# Patient Record
Sex: Female | Born: 1935 | Race: White | Hispanic: No | Smoking: Never smoker
Health system: Southern US, Community
[De-identification: ages and names within clinical notes are randomized; demographics above are authoritative.]

## PROBLEM LIST (undated history)

## (undated) DIAGNOSIS — E039 Hypothyroidism, unspecified: Secondary | ICD-10-CM

## (undated) DIAGNOSIS — B2790 Infectious mononucleosis, unspecified without complication: Secondary | ICD-10-CM

## (undated) DIAGNOSIS — G43909 Migraine, unspecified, not intractable, without status migrainosus: Secondary | ICD-10-CM

## (undated) DIAGNOSIS — M1990 Unspecified osteoarthritis, unspecified site: Secondary | ICD-10-CM

## (undated) DIAGNOSIS — I509 Heart failure, unspecified: Secondary | ICD-10-CM

## (undated) DIAGNOSIS — E7800 Pure hypercholesterolemia, unspecified: Secondary | ICD-10-CM

## (undated) HISTORY — PX: ABDOMINAL HYSTERECTOMY: SHX81

## (undated) HISTORY — PX: HERNIA REPAIR: SHX51

## (undated) HISTORY — PX: BREAST SURGERY: SHX581

## (undated) HISTORY — PX: BLADDER SUSPENSION: SHX72

## (undated) HISTORY — PX: UMBILICAL HERNIA REPAIR: SHX196

## (undated) HISTORY — PX: BREAST LUMPECTOMY: SHX2

## (undated) HISTORY — PX: FRACTURE SURGERY: SHX138

## (undated) HISTORY — PX: ANKLE FRACTURE SURGERY: SHX122

---

## 2011-09-19 ENCOUNTER — Emergency Department (HOSPITAL_COMMUNITY): Payer: Medicare Other

## 2011-09-19 ENCOUNTER — Encounter (HOSPITAL_COMMUNITY): Payer: Self-pay | Admitting: Emergency Medicine

## 2011-09-19 ENCOUNTER — Emergency Department (HOSPITAL_COMMUNITY)
Admission: EM | Admit: 2011-09-19 | Discharge: 2011-09-19 | Disposition: A | Discharge status: Home / Self Care | Payer: Medicare Other | Attending: Emergency Medicine | Admitting: Emergency Medicine

## 2011-09-19 ENCOUNTER — Other Ambulatory Visit: Payer: Self-pay

## 2011-09-19 DIAGNOSIS — M25519 Pain in unspecified shoulder: Secondary | ICD-10-CM | POA: Insufficient documentation

## 2011-09-19 DIAGNOSIS — M47812 Spondylosis without myelopathy or radiculopathy, cervical region: Secondary | ICD-10-CM | POA: Insufficient documentation

## 2011-09-19 DIAGNOSIS — E7800 Pure hypercholesterolemia, unspecified: Secondary | ICD-10-CM | POA: Insufficient documentation

## 2011-09-19 DIAGNOSIS — J984 Other disorders of lung: Secondary | ICD-10-CM | POA: Insufficient documentation

## 2011-09-19 DIAGNOSIS — R079 Chest pain, unspecified: Secondary | ICD-10-CM | POA: Insufficient documentation

## 2011-09-19 DIAGNOSIS — R109 Unspecified abdominal pain: Secondary | ICD-10-CM | POA: Insufficient documentation

## 2011-09-19 DIAGNOSIS — E039 Hypothyroidism, unspecified: Secondary | ICD-10-CM | POA: Insufficient documentation

## 2011-09-19 DIAGNOSIS — K8020 Calculus of gallbladder without cholecystitis without obstruction: Secondary | ICD-10-CM | POA: Insufficient documentation

## 2011-09-19 DIAGNOSIS — M25569 Pain in unspecified knee: Secondary | ICD-10-CM | POA: Insufficient documentation

## 2011-09-19 DIAGNOSIS — R000 Tachycardia, unspecified: Secondary | ICD-10-CM | POA: Insufficient documentation

## 2011-09-19 DIAGNOSIS — R42 Dizziness and giddiness: Secondary | ICD-10-CM | POA: Insufficient documentation

## 2011-09-19 DIAGNOSIS — R0602 Shortness of breath: Secondary | ICD-10-CM | POA: Insufficient documentation

## 2011-09-19 DIAGNOSIS — I10 Essential (primary) hypertension: Secondary | ICD-10-CM | POA: Insufficient documentation

## 2011-09-19 DIAGNOSIS — K5730 Diverticulosis of large intestine without perforation or abscess without bleeding: Secondary | ICD-10-CM | POA: Insufficient documentation

## 2011-09-19 DIAGNOSIS — I959 Hypotension, unspecified: Secondary | ICD-10-CM | POA: Insufficient documentation

## 2011-09-19 HISTORY — DX: Hypothyroidism, unspecified: E03.9

## 2011-09-19 HISTORY — DX: Pure hypercholesterolemia, unspecified: E78.00

## 2011-09-19 LAB — COMPREHENSIVE METABOLIC PANEL
Albumin: 4 g/dL (ref 3.5–5.2)
Alkaline Phosphatase: 67 U/L (ref 39–117)
BUN: 21 mg/dL (ref 6–23)
Calcium: 9.8 mg/dL (ref 8.4–10.5)
Potassium: 4.4 mEq/L (ref 3.5–5.1)
Total Protein: 6.9 g/dL (ref 6.0–8.3)

## 2011-09-19 LAB — CBC
HCT: 41.8 % (ref 36.0–46.0)
MCH: 30.3 pg (ref 26.0–34.0)
MCHC: 33.5 g/dL (ref 30.0–36.0)
RDW: 14.1 % (ref 11.5–15.5)

## 2011-09-19 LAB — LIPASE, BLOOD: Lipase: 63 U/L — ABNORMAL HIGH (ref 11–59)

## 2011-09-19 MED ORDER — IOHEXOL 300 MG/ML  SOLN
100.0000 mL | Freq: Once | INTRAMUSCULAR | Status: AC | PRN
  Administered 2011-09-19: 100 mL via INTRAVENOUS

## 2011-09-19 NOTE — ED Notes (Addendum)
Pt transported to CT. 20:16 pt returned from CT. Pt placed back on monitor.

## 2011-09-19 NOTE — ED Notes (Signed)
Pt aware of discharge. Pt not complaining of pain at this time except for breathing deep but pain has decreased to a 1/10 with deep breath. Pt states that her right ankle has been hurting her at times she she moves her toes and rotates her foot. Pt remains alert and oriented.
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
## 2011-09-19 NOTE — ED Notes (Signed)
EKG completed and given to Dr. Anitra Lauth.  No OLD ekg.
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
## 2011-09-19 NOTE — ED Notes (Addendum)
Pt to xray
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
## 2013-09-21 DIAGNOSIS — M25569 Pain in unspecified knee: Secondary | ICD-10-CM | POA: Diagnosis not present

## 2013-11-29 DIAGNOSIS — E785 Hyperlipidemia, unspecified: Secondary | ICD-10-CM | POA: Diagnosis not present

## 2013-11-29 DIAGNOSIS — R0602 Shortness of breath: Secondary | ICD-10-CM | POA: Diagnosis not present

## 2013-11-29 DIAGNOSIS — N183 Chronic kidney disease, stage 3 unspecified: Secondary | ICD-10-CM | POA: Diagnosis not present

## 2013-11-29 DIAGNOSIS — I10 Essential (primary) hypertension: Secondary | ICD-10-CM | POA: Diagnosis not present

## 2013-12-02 DIAGNOSIS — R5381 Other malaise: Secondary | ICD-10-CM | POA: Diagnosis not present

## 2013-12-02 DIAGNOSIS — E785 Hyperlipidemia, unspecified: Secondary | ICD-10-CM | POA: Diagnosis not present

## 2013-12-02 DIAGNOSIS — N183 Chronic kidney disease, stage 3 unspecified: Secondary | ICD-10-CM | POA: Diagnosis not present

## 2013-12-02 DIAGNOSIS — I10 Essential (primary) hypertension: Secondary | ICD-10-CM | POA: Diagnosis not present

## 2014-03-29 DIAGNOSIS — N183 Chronic kidney disease, stage 3 unspecified: Secondary | ICD-10-CM | POA: Diagnosis not present

## 2014-03-29 DIAGNOSIS — E039 Hypothyroidism, unspecified: Secondary | ICD-10-CM | POA: Diagnosis not present

## 2014-03-29 DIAGNOSIS — R5383 Other fatigue: Secondary | ICD-10-CM | POA: Diagnosis not present

## 2014-03-29 DIAGNOSIS — R5381 Other malaise: Secondary | ICD-10-CM | POA: Diagnosis not present

## 2014-04-07 DIAGNOSIS — I10 Essential (primary) hypertension: Secondary | ICD-10-CM | POA: Diagnosis not present

## 2014-04-07 DIAGNOSIS — D485 Neoplasm of uncertain behavior of skin: Secondary | ICD-10-CM | POA: Diagnosis not present

## 2014-04-07 DIAGNOSIS — E039 Hypothyroidism, unspecified: Secondary | ICD-10-CM | POA: Diagnosis not present

## 2014-04-07 DIAGNOSIS — R29818 Other symptoms and signs involving the nervous system: Secondary | ICD-10-CM | POA: Diagnosis not present

## 2014-04-07 DIAGNOSIS — N183 Chronic kidney disease, stage 3 unspecified: Secondary | ICD-10-CM | POA: Diagnosis not present

## 2014-04-07 DIAGNOSIS — M948X9 Other specified disorders of cartilage, unspecified sites: Secondary | ICD-10-CM | POA: Diagnosis not present

## 2014-04-07 DIAGNOSIS — R490 Dysphonia: Secondary | ICD-10-CM | POA: Diagnosis not present

## 2014-06-01 DIAGNOSIS — E039 Hypothyroidism, unspecified: Secondary | ICD-10-CM | POA: Diagnosis not present

## 2014-06-01 DIAGNOSIS — M19011 Primary osteoarthritis, right shoulder: Secondary | ICD-10-CM | POA: Diagnosis not present

## 2014-06-01 DIAGNOSIS — M19012 Primary osteoarthritis, left shoulder: Secondary | ICD-10-CM | POA: Diagnosis not present

## 2014-06-01 DIAGNOSIS — D485 Neoplasm of uncertain behavior of skin: Secondary | ICD-10-CM | POA: Diagnosis not present

## 2014-06-01 DIAGNOSIS — I10 Essential (primary) hypertension: Secondary | ICD-10-CM | POA: Diagnosis not present

## 2014-06-01 DIAGNOSIS — M898X8 Other specified disorders of bone, other site: Secondary | ICD-10-CM | POA: Diagnosis not present

## 2014-06-08 DIAGNOSIS — M898X8 Other specified disorders of bone, other site: Secondary | ICD-10-CM | POA: Diagnosis not present

## 2014-06-08 DIAGNOSIS — G4700 Insomnia, unspecified: Secondary | ICD-10-CM | POA: Diagnosis not present

## 2014-06-08 DIAGNOSIS — E039 Hypothyroidism, unspecified: Secondary | ICD-10-CM | POA: Diagnosis not present

## 2014-06-08 DIAGNOSIS — M25562 Pain in left knee: Secondary | ICD-10-CM | POA: Diagnosis not present

## 2014-06-08 DIAGNOSIS — N183 Chronic kidney disease, stage 3 (moderate): Secondary | ICD-10-CM | POA: Diagnosis not present

## 2014-06-08 DIAGNOSIS — Z23 Encounter for immunization: Secondary | ICD-10-CM | POA: Diagnosis not present

## 2014-06-08 DIAGNOSIS — I10 Essential (primary) hypertension: Secondary | ICD-10-CM | POA: Diagnosis not present

## 2014-06-08 DIAGNOSIS — R490 Dysphonia: Secondary | ICD-10-CM | POA: Diagnosis not present

## 2014-06-15 DIAGNOSIS — R5383 Other fatigue: Secondary | ICD-10-CM | POA: Diagnosis not present

## 2014-06-15 DIAGNOSIS — R490 Dysphonia: Secondary | ICD-10-CM | POA: Diagnosis not present

## 2014-06-23 DIAGNOSIS — Z961 Presence of intraocular lens: Secondary | ICD-10-CM | POA: Diagnosis not present

## 2014-07-31 ENCOUNTER — Emergency Department (HOSPITAL_COMMUNITY)
Admission: EM | Admit: 2014-07-31 | Discharge: 2014-07-31 | Disposition: A | Discharge status: Home / Self Care | Payer: Medicare Other | Attending: Emergency Medicine | Admitting: Emergency Medicine

## 2014-07-31 ENCOUNTER — Other Ambulatory Visit: Payer: Self-pay

## 2014-07-31 ENCOUNTER — Encounter (HOSPITAL_COMMUNITY): Payer: Self-pay | Admitting: Emergency Medicine

## 2014-07-31 ENCOUNTER — Emergency Department (HOSPITAL_COMMUNITY): Payer: Medicare Other

## 2014-07-31 DIAGNOSIS — I10 Essential (primary) hypertension: Secondary | ICD-10-CM | POA: Diagnosis not present

## 2014-07-31 DIAGNOSIS — R42 Dizziness and giddiness: Secondary | ICD-10-CM | POA: Insufficient documentation

## 2014-07-31 DIAGNOSIS — Z79899 Other long term (current) drug therapy: Secondary | ICD-10-CM | POA: Diagnosis not present

## 2014-07-31 DIAGNOSIS — E039 Hypothyroidism, unspecified: Secondary | ICD-10-CM | POA: Diagnosis not present

## 2014-07-31 DIAGNOSIS — Z7982 Long term (current) use of aspirin: Secondary | ICD-10-CM | POA: Insufficient documentation

## 2014-07-31 LAB — BASIC METABOLIC PANEL
ANION GAP: 12 (ref 5–15)
BUN: 19 mg/dL (ref 6–23)
CALCIUM: 9.2 mg/dL (ref 8.4–10.5)
CO2: 24 meq/L (ref 19–32)
CREATININE: 0.85 mg/dL (ref 0.50–1.10)
Chloride: 108 mEq/L (ref 96–112)
GFR, EST AFRICAN AMERICAN: 69 mL/min — AB (ref >90)
GFR, EST NON AFRICAN AMERICAN: 59 mL/min — AB (ref >90)
Glucose, Bld: 89 mg/dL (ref 70–99)
Potassium: 4.4 mEq/L (ref 3.7–5.3)
SODIUM: 144 meq/L (ref 137–147)

## 2014-07-31 LAB — CBC
HEMATOCRIT: 38.3 % (ref 36.0–46.0)
HEMOGLOBIN: 12.3 g/dL (ref 12.0–15.0)
MCH: 29 pg (ref 26.0–34.0)
MCHC: 32.1 g/dL (ref 30.0–36.0)
MCV: 90.3 fL (ref 78.0–100.0)
Platelets: 193 10*3/uL (ref 150–400)
RBC: 4.24 MIL/uL (ref 3.87–5.11)
RDW: 13.8 % (ref 11.5–15.5)
WBC: 4.7 10*3/uL (ref 4.0–10.5)

## 2014-07-31 LAB — CBG MONITORING, ED: Glucose-Capillary: 101 mg/dL — ABNORMAL HIGH (ref 70–99)

## 2014-07-31 LAB — URINALYSIS, ROUTINE W REFLEX MICROSCOPIC
Bilirubin Urine: NEGATIVE
GLUCOSE, UA: NEGATIVE mg/dL
HGB URINE DIPSTICK: NEGATIVE
Ketones, ur: NEGATIVE mg/dL
Nitrite: NEGATIVE
PROTEIN: NEGATIVE mg/dL
SPECIFIC GRAVITY, URINE: 1.01 (ref 1.005–1.030)
Urobilinogen, UA: 0.2 mg/dL (ref 0.0–1.0)
pH: 7.5 (ref 5.0–8.0)

## 2014-07-31 LAB — URINE MICROSCOPIC-ADD ON

## 2014-07-31 LAB — TROPONIN I

## 2014-07-31 MED ORDER — LORAZEPAM 0.5 MG PO TABS: 0.5000 mg | ORAL_TABLET | Freq: Three times a day (TID) | ORAL | Status: DC | PRN

## 2014-07-31 MED ORDER — LORAZEPAM 1 MG PO TABS
0.5000 mg | ORAL_TABLET | Freq: Once | ORAL | Status: AC
  Administered 2014-07-31: 0.5 mg via ORAL
  Filled 2014-07-31: qty 1

## 2014-07-31 NOTE — ED Notes (Signed)
Pt ambulated with standby assist. Pt had an episode of dizziness with sitting with lasted only a few second. Pt states that she feels "much better"

## 2014-07-31 NOTE — ED Provider Notes (Signed)
CSN: [id number]     Arrival date & time 07/31/14  7591 History   First MD Initiated Contact with Patient 07/31/14 [phone]     Chief Complaint  Patient presents with  . Dizziness     (Consider location/radiation/quality/duration/timing/severity/associated sxs/prior Treatment) HPI   Madison Marquez is a 80 y.o. female who reports dizziness that started during the night, when she got up to go to the bathroom.  She was able to get there, then went back to bed.  Later she got up at her usual time this morning and noticed again that she was dizzy, with movements of head and walking.  The dizziness caused her to feel nauseated.  She came by EMS because she did not feel that she could walk.  She has never had this previously.  She denies fever, chills, rhinorrhea, cough, shortness of breath, chest pain, paresthesia or focal weakness.  She has not had any recent illnesses.  She is taking her usual medications.  There are no other known modifying factors.    Past Medical History  Diagnosis Date  . Hypothyroid   . Hypertension   . Hypercholesterolemia    Past Surgical History  Procedure Laterality Date  . Hernia repair     No family history on file. History  Substance Use Topics  . Smoking status: Never Smoker   . Smokeless tobacco: Not on file  . Alcohol Use: No   OB History    No data available     Review of Systems  All other systems reviewed and are negative.     Allergies  Review of patient's allergies indicates no known allergies.  Home Medications   Prior to Admission medications   Medication Sig Start Date End Date Taking? Authorizing Provider  aspirin 325 MG tablet Take 325 mg by mouth daily.   Yes Historical Provider, MD  B Complex-C (B-COMPLEX WITH VITAMIN C) tablet Take 1 tablet by mouth daily.   Yes Historical Provider, MD  cholecalciferol (VITAMIN D) 1000 UNITS tablet Take 1,000 Units by mouth daily.   Yes Historical Provider, MD  levothyroxine (SYNTHROID,  LEVOTHROID) 75 MCG tablet Take 75 mcg by mouth daily.   Yes Historical Provider, MD  vitamin B-12 (CYANOCOBALAMIN) 1000 MCG tablet Take 1,000 mcg by mouth daily.   Yes Historical Provider, MD  lisinopril (PRINIVIL,ZESTRIL) 10 MG tablet Take 10 mg by mouth at bedtime.    Historical Provider, MD  LORazepam (ATIVAN) 0.5 MG tablet Take 1 tablet (0.5 mg total) by mouth every 8 (eight) hours as needed (dizziness). 07/31/14   Richarda Blade, MD  simvastatin (ZOCOR) 40 MG tablet Take 40 mg by mouth at bedtime.    Historical Provider, MD   BP 128/72 mmHg  Pulse 84  Temp(Src) 98 F (36.7 C) (Oral)  Resp 16  Ht 5\' 5"  (1.651 m)  Wt 160 lb (72.576 kg)  BMI 26.63 kg/m2  SpO2 100% Physical Exam  Constitutional: She is oriented to person, place, and time. She appears well-developed.  Elderly, frail  HENT:  Head: Normocephalic and atraumatic.  Right Ear: External ear normal.  Left Ear: External ear normal.  Eyes: Conjunctivae and EOM are normal. Pupils are equal, round, and reactive to light.  Neck: Normal range of motion and phonation normal. Neck supple.  Cardiovascular: Normal rate, regular rhythm and normal heart sounds.   Pulmonary/Chest: Effort normal and breath sounds normal. She exhibits no bony tenderness.  Abdominal: Soft. There is no tenderness.  Musculoskeletal: Normal range of  motion.  Neurological: She is alert and oriented to person, place, and time. No cranial nerve deficit or sensory deficit. She exhibits normal muscle tone. Coordination normal.  No dysarthria or aphasia.  Mild bilateral direction changing nystagmus, which extinguishes in a few beats.  Negative test of skew, and head impulse testing  Skin: Skin is warm, dry and intact.  Psychiatric: She has a normal mood and affect. Her behavior is normal. Judgment and thought content normal.  Nursing note and vitals reviewed.   ED Course  Procedures (including critical care time)  Medications  LORazepam (ATIVAN) tablet 0.5 mg  (0.5 mg Oral Given 07/31/14 1147)    No data found.   11:31 AM Reevaluation with update and discussion. After initial assessment and treatment, an updated evaluation reveals she continues to be comfortable at rest, without dizziness.  On an attempt at ambulation, she had significant dizziness that required support of nursing staff to prevent her from falling.  She will be treated with Ativan, and attempt again to ambulate.  Family is updated on the current findings, and anticipated plan. [name]   13:00- she is now able to ambulate easily, without assistance.  She is somewhat sleepy from the Ativan.  Findings discussed with patient and family member, all questions answered.   Labs Review Labs Reviewed  BASIC METABOLIC PANEL - Abnormal; Notable for the following:    GFR calc non Af Amer 59 (*)    GFR calc Af Amer 69 (*)    All other components within normal limits  URINALYSIS, ROUTINE W REFLEX MICROSCOPIC - Abnormal; Notable for the following:    Leukocytes, UA SMALL (*)    All other components within normal limits  URINE MICROSCOPIC-ADD ON - Abnormal; Notable for the following:    Squamous Epithelial / LPF FEW (*)    All other components within normal limits  CBG MONITORING, ED - Abnormal; Notable for the following:    Glucose-Capillary 101 (*)    All other components within normal limits  URINE CULTURE  CBC  TROPONIN I    Imaging Review No results found.   EKG Interpretation   Date/Time:  Sunday July 31 2014 08:26:57 EST Ventricular Rate:  58 PR Interval:  202 QRS Duration: 95 QT Interval:  414 QTC Calculation: 407 R Axis:   -37 Text Interpretation:  Sinus rhythm Ventricular premature complex Left axis  deviation Low voltage, extremity leads ED PHYSICIAN INTERPRETATION  AVAILABLE IN [facility] Confirmed by TEST, Record ([id number]) on  08/02/2014 4:30:39 PM         Date: 07/31/14  Rate: 58  Rhythm: normal sinus rhythm  QRS Axis: normal  PR and QT  Intervals: normal  ST/T Wave abnormalities: normal  PR and QRS Conduction Disutrbances:none  Narrative Interpretation: Single PVC  Old EKG Reviewed: unchanged - 09/19/11   MDM   Final diagnoses:  Dizziness    Nonspecific dizziness. Doubt CVA, ACS or dehydration. Pt is stable for d/c.   Nursing Notes Reviewed/ Care Coordinated Applicable Imaging Reviewed Interpretation of Laboratory Data incorporated into ED treatment  The patient appears reasonably screened and/or stabilized for discharge and I doubt any other medical condition or other [facility] requiring further screening, evaluation, or treatment in the ED at this time prior to discharge.  Plan: Home Medications- Ativan prn dizziness; Home Treatments- rest; return here if the recommended treatment, does not improve the symptoms; Recommended follow up- PCP check up in 1 week  Richarda Blade, MD 08/02/14 2002

## 2014-07-31 NOTE — ED Notes (Signed)
Received pt from home with c/o dizziness when she woke up sometime around 0100. Pt was last normal when she went to bed last night at 2030. Pt reports dizziness worse with sitting. No facial droop, speech clear, equal grips.

## 2014-07-31 NOTE — Discharge Instructions (Signed)

## 2014-07-31 NOTE — ED Notes (Signed)
Pt sat on side of the bed reports dizziness. Pt attempted to stand and had to sit back down due to increasing dizziness.

## 2014-08-01 LAB — URINE CULTURE: Colony Count: 75000

## 2014-12-30 DIAGNOSIS — R490 Dysphonia: Secondary | ICD-10-CM | POA: Diagnosis not present

## 2014-12-30 DIAGNOSIS — R200 Anesthesia of skin: Secondary | ICD-10-CM | POA: Diagnosis not present

## 2014-12-30 DIAGNOSIS — E039 Hypothyroidism, unspecified: Secondary | ICD-10-CM | POA: Diagnosis not present

## 2014-12-30 DIAGNOSIS — R0600 Dyspnea, unspecified: Secondary | ICD-10-CM | POA: Diagnosis not present

## 2015-05-23 DIAGNOSIS — Z23 Encounter for immunization: Secondary | ICD-10-CM | POA: Diagnosis not present

## 2015-07-03 DIAGNOSIS — Z961 Presence of intraocular lens: Secondary | ICD-10-CM | POA: Diagnosis not present

## 2015-07-18 DIAGNOSIS — Z780 Asymptomatic menopausal state: Secondary | ICD-10-CM | POA: Diagnosis not present

## 2015-07-18 DIAGNOSIS — E039 Hypothyroidism, unspecified: Secondary | ICD-10-CM | POA: Diagnosis not present

## 2015-07-18 DIAGNOSIS — Z79899 Other long term (current) drug therapy: Secondary | ICD-10-CM | POA: Diagnosis not present

## 2015-07-18 DIAGNOSIS — L989 Disorder of the skin and subcutaneous tissue, unspecified: Secondary | ICD-10-CM | POA: Diagnosis not present

## 2015-07-18 DIAGNOSIS — Z1389 Encounter for screening for other disorder: Secondary | ICD-10-CM | POA: Diagnosis not present

## 2015-07-18 DIAGNOSIS — Z0000 Encounter for general adult medical examination without abnormal findings: Secondary | ICD-10-CM | POA: Diagnosis not present

## 2015-07-31 DIAGNOSIS — Z780 Asymptomatic menopausal state: Secondary | ICD-10-CM | POA: Diagnosis not present

## 2015-07-31 DIAGNOSIS — M810 Age-related osteoporosis without current pathological fracture: Secondary | ICD-10-CM | POA: Diagnosis not present

## 2015-08-27 HISTORY — PX: CATARACT EXTRACTION W/ INTRAOCULAR LENS  IMPLANT, BILATERAL: SHX1307

## 2015-09-20 DIAGNOSIS — M25561 Pain in right knee: Secondary | ICD-10-CM | POA: Diagnosis not present

## 2015-09-20 DIAGNOSIS — E559 Vitamin D deficiency, unspecified: Secondary | ICD-10-CM | POA: Diagnosis not present

## 2015-09-20 DIAGNOSIS — R202 Paresthesia of skin: Secondary | ICD-10-CM | POA: Diagnosis not present

## 2015-09-20 DIAGNOSIS — M810 Age-related osteoporosis without current pathological fracture: Secondary | ICD-10-CM | POA: Diagnosis not present

## 2015-09-25 DIAGNOSIS — M170 Bilateral primary osteoarthritis of knee: Secondary | ICD-10-CM | POA: Diagnosis not present

## 2015-10-16 DIAGNOSIS — G629 Polyneuropathy, unspecified: Secondary | ICD-10-CM | POA: Diagnosis not present

## 2015-10-16 DIAGNOSIS — G5600 Carpal tunnel syndrome, unspecified upper limb: Secondary | ICD-10-CM | POA: Diagnosis not present

## 2015-11-03 DIAGNOSIS — M19042 Primary osteoarthritis, left hand: Secondary | ICD-10-CM | POA: Diagnosis not present

## 2015-11-03 DIAGNOSIS — G5601 Carpal tunnel syndrome, right upper limb: Secondary | ICD-10-CM | POA: Diagnosis not present

## 2015-11-03 DIAGNOSIS — M19041 Primary osteoarthritis, right hand: Secondary | ICD-10-CM | POA: Diagnosis not present

## 2015-11-03 DIAGNOSIS — G5602 Carpal tunnel syndrome, left upper limb: Secondary | ICD-10-CM | POA: Diagnosis not present

## 2015-11-03 DIAGNOSIS — M180 Bilateral primary osteoarthritis of first carpometacarpal joints: Secondary | ICD-10-CM | POA: Diagnosis not present

## 2015-11-28 DIAGNOSIS — R278 Other lack of coordination: Secondary | ICD-10-CM | POA: Diagnosis not present

## 2015-11-28 DIAGNOSIS — G5602 Carpal tunnel syndrome, left upper limb: Secondary | ICD-10-CM | POA: Diagnosis not present

## 2015-11-28 DIAGNOSIS — M25561 Pain in right knee: Secondary | ICD-10-CM | POA: Diagnosis not present

## 2015-11-28 DIAGNOSIS — R262 Difficulty in walking, not elsewhere classified: Secondary | ICD-10-CM | POA: Diagnosis not present

## 2015-11-28 DIAGNOSIS — G5601 Carpal tunnel syndrome, right upper limb: Secondary | ICD-10-CM | POA: Diagnosis not present

## 2015-11-28 DIAGNOSIS — M25562 Pain in left knee: Secondary | ICD-10-CM | POA: Diagnosis not present

## 2015-11-28 DIAGNOSIS — M6281 Muscle weakness (generalized): Secondary | ICD-10-CM | POA: Diagnosis not present

## 2015-11-29 DIAGNOSIS — M6281 Muscle weakness (generalized): Secondary | ICD-10-CM | POA: Diagnosis not present

## 2015-11-29 DIAGNOSIS — R262 Difficulty in walking, not elsewhere classified: Secondary | ICD-10-CM | POA: Diagnosis not present

## 2015-11-29 DIAGNOSIS — G5601 Carpal tunnel syndrome, right upper limb: Secondary | ICD-10-CM | POA: Diagnosis not present

## 2015-11-29 DIAGNOSIS — M25562 Pain in left knee: Secondary | ICD-10-CM | POA: Diagnosis not present

## 2015-11-29 DIAGNOSIS — G5602 Carpal tunnel syndrome, left upper limb: Secondary | ICD-10-CM | POA: Diagnosis not present

## 2015-11-29 DIAGNOSIS — M25561 Pain in right knee: Secondary | ICD-10-CM | POA: Diagnosis not present

## 2015-11-30 DIAGNOSIS — G5602 Carpal tunnel syndrome, left upper limb: Secondary | ICD-10-CM | POA: Diagnosis not present

## 2015-11-30 DIAGNOSIS — M6281 Muscle weakness (generalized): Secondary | ICD-10-CM | POA: Diagnosis not present

## 2015-11-30 DIAGNOSIS — M25561 Pain in right knee: Secondary | ICD-10-CM | POA: Diagnosis not present

## 2015-11-30 DIAGNOSIS — G5601 Carpal tunnel syndrome, right upper limb: Secondary | ICD-10-CM | POA: Diagnosis not present

## 2015-11-30 DIAGNOSIS — R262 Difficulty in walking, not elsewhere classified: Secondary | ICD-10-CM | POA: Diagnosis not present

## 2015-11-30 DIAGNOSIS — M25562 Pain in left knee: Secondary | ICD-10-CM | POA: Diagnosis not present

## 2015-12-01 DIAGNOSIS — G5602 Carpal tunnel syndrome, left upper limb: Secondary | ICD-10-CM | POA: Diagnosis not present

## 2015-12-01 DIAGNOSIS — G5601 Carpal tunnel syndrome, right upper limb: Secondary | ICD-10-CM | POA: Diagnosis not present

## 2015-12-01 DIAGNOSIS — M6281 Muscle weakness (generalized): Secondary | ICD-10-CM | POA: Diagnosis not present

## 2015-12-01 DIAGNOSIS — R262 Difficulty in walking, not elsewhere classified: Secondary | ICD-10-CM | POA: Diagnosis not present

## 2015-12-01 DIAGNOSIS — M25561 Pain in right knee: Secondary | ICD-10-CM | POA: Diagnosis not present

## 2015-12-01 DIAGNOSIS — M25562 Pain in left knee: Secondary | ICD-10-CM | POA: Diagnosis not present

## 2015-12-04 DIAGNOSIS — M6281 Muscle weakness (generalized): Secondary | ICD-10-CM | POA: Diagnosis not present

## 2015-12-04 DIAGNOSIS — G5602 Carpal tunnel syndrome, left upper limb: Secondary | ICD-10-CM | POA: Diagnosis not present

## 2015-12-04 DIAGNOSIS — M25561 Pain in right knee: Secondary | ICD-10-CM | POA: Diagnosis not present

## 2015-12-04 DIAGNOSIS — M25562 Pain in left knee: Secondary | ICD-10-CM | POA: Diagnosis not present

## 2015-12-04 DIAGNOSIS — G5601 Carpal tunnel syndrome, right upper limb: Secondary | ICD-10-CM | POA: Diagnosis not present

## 2015-12-04 DIAGNOSIS — R262 Difficulty in walking, not elsewhere classified: Secondary | ICD-10-CM | POA: Diagnosis not present

## 2015-12-06 DIAGNOSIS — M6281 Muscle weakness (generalized): Secondary | ICD-10-CM | POA: Diagnosis not present

## 2015-12-06 DIAGNOSIS — G5602 Carpal tunnel syndrome, left upper limb: Secondary | ICD-10-CM | POA: Diagnosis not present

## 2015-12-06 DIAGNOSIS — G5601 Carpal tunnel syndrome, right upper limb: Secondary | ICD-10-CM | POA: Diagnosis not present

## 2015-12-06 DIAGNOSIS — M25561 Pain in right knee: Secondary | ICD-10-CM | POA: Diagnosis not present

## 2015-12-06 DIAGNOSIS — R262 Difficulty in walking, not elsewhere classified: Secondary | ICD-10-CM | POA: Diagnosis not present

## 2015-12-06 DIAGNOSIS — M25562 Pain in left knee: Secondary | ICD-10-CM | POA: Diagnosis not present

## 2015-12-07 DIAGNOSIS — M25562 Pain in left knee: Secondary | ICD-10-CM | POA: Diagnosis not present

## 2015-12-07 DIAGNOSIS — M6281 Muscle weakness (generalized): Secondary | ICD-10-CM | POA: Diagnosis not present

## 2015-12-07 DIAGNOSIS — M25561 Pain in right knee: Secondary | ICD-10-CM | POA: Diagnosis not present

## 2015-12-07 DIAGNOSIS — G5602 Carpal tunnel syndrome, left upper limb: Secondary | ICD-10-CM | POA: Diagnosis not present

## 2015-12-07 DIAGNOSIS — R262 Difficulty in walking, not elsewhere classified: Secondary | ICD-10-CM | POA: Diagnosis not present

## 2015-12-07 DIAGNOSIS — G5601 Carpal tunnel syndrome, right upper limb: Secondary | ICD-10-CM | POA: Diagnosis not present

## 2015-12-08 DIAGNOSIS — G5602 Carpal tunnel syndrome, left upper limb: Secondary | ICD-10-CM | POA: Diagnosis not present

## 2015-12-08 DIAGNOSIS — M25562 Pain in left knee: Secondary | ICD-10-CM | POA: Diagnosis not present

## 2015-12-08 DIAGNOSIS — R262 Difficulty in walking, not elsewhere classified: Secondary | ICD-10-CM | POA: Diagnosis not present

## 2015-12-08 DIAGNOSIS — M6281 Muscle weakness (generalized): Secondary | ICD-10-CM | POA: Diagnosis not present

## 2015-12-08 DIAGNOSIS — G5601 Carpal tunnel syndrome, right upper limb: Secondary | ICD-10-CM | POA: Diagnosis not present

## 2015-12-08 DIAGNOSIS — M25561 Pain in right knee: Secondary | ICD-10-CM | POA: Diagnosis not present

## 2015-12-11 DIAGNOSIS — G5601 Carpal tunnel syndrome, right upper limb: Secondary | ICD-10-CM | POA: Diagnosis not present

## 2015-12-11 DIAGNOSIS — M25562 Pain in left knee: Secondary | ICD-10-CM | POA: Diagnosis not present

## 2015-12-11 DIAGNOSIS — G5602 Carpal tunnel syndrome, left upper limb: Secondary | ICD-10-CM | POA: Diagnosis not present

## 2015-12-11 DIAGNOSIS — R262 Difficulty in walking, not elsewhere classified: Secondary | ICD-10-CM | POA: Diagnosis not present

## 2015-12-11 DIAGNOSIS — M6281 Muscle weakness (generalized): Secondary | ICD-10-CM | POA: Diagnosis not present

## 2015-12-11 DIAGNOSIS — M25561 Pain in right knee: Secondary | ICD-10-CM | POA: Diagnosis not present

## 2015-12-12 DIAGNOSIS — G5602 Carpal tunnel syndrome, left upper limb: Secondary | ICD-10-CM | POA: Diagnosis not present

## 2015-12-12 DIAGNOSIS — M6281 Muscle weakness (generalized): Secondary | ICD-10-CM | POA: Diagnosis not present

## 2015-12-12 DIAGNOSIS — R262 Difficulty in walking, not elsewhere classified: Secondary | ICD-10-CM | POA: Diagnosis not present

## 2015-12-12 DIAGNOSIS — G5601 Carpal tunnel syndrome, right upper limb: Secondary | ICD-10-CM | POA: Diagnosis not present

## 2015-12-12 DIAGNOSIS — M25562 Pain in left knee: Secondary | ICD-10-CM | POA: Diagnosis not present

## 2015-12-12 DIAGNOSIS — M25561 Pain in right knee: Secondary | ICD-10-CM | POA: Diagnosis not present

## 2015-12-13 DIAGNOSIS — M25561 Pain in right knee: Secondary | ICD-10-CM | POA: Diagnosis not present

## 2015-12-13 DIAGNOSIS — G5602 Carpal tunnel syndrome, left upper limb: Secondary | ICD-10-CM | POA: Diagnosis not present

## 2015-12-13 DIAGNOSIS — M25562 Pain in left knee: Secondary | ICD-10-CM | POA: Diagnosis not present

## 2015-12-13 DIAGNOSIS — G5601 Carpal tunnel syndrome, right upper limb: Secondary | ICD-10-CM | POA: Diagnosis not present

## 2015-12-13 DIAGNOSIS — M6281 Muscle weakness (generalized): Secondary | ICD-10-CM | POA: Diagnosis not present

## 2015-12-13 DIAGNOSIS — R262 Difficulty in walking, not elsewhere classified: Secondary | ICD-10-CM | POA: Diagnosis not present

## 2015-12-14 DIAGNOSIS — G5601 Carpal tunnel syndrome, right upper limb: Secondary | ICD-10-CM | POA: Diagnosis not present

## 2015-12-14 DIAGNOSIS — M25562 Pain in left knee: Secondary | ICD-10-CM | POA: Diagnosis not present

## 2015-12-14 DIAGNOSIS — M25561 Pain in right knee: Secondary | ICD-10-CM | POA: Diagnosis not present

## 2015-12-14 DIAGNOSIS — G5602 Carpal tunnel syndrome, left upper limb: Secondary | ICD-10-CM | POA: Diagnosis not present

## 2015-12-14 DIAGNOSIS — M6281 Muscle weakness (generalized): Secondary | ICD-10-CM | POA: Diagnosis not present

## 2015-12-14 DIAGNOSIS — R262 Difficulty in walking, not elsewhere classified: Secondary | ICD-10-CM | POA: Diagnosis not present

## 2015-12-15 DIAGNOSIS — M25562 Pain in left knee: Secondary | ICD-10-CM | POA: Diagnosis not present

## 2015-12-15 DIAGNOSIS — R262 Difficulty in walking, not elsewhere classified: Secondary | ICD-10-CM | POA: Diagnosis not present

## 2015-12-15 DIAGNOSIS — M25561 Pain in right knee: Secondary | ICD-10-CM | POA: Diagnosis not present

## 2015-12-15 DIAGNOSIS — M6281 Muscle weakness (generalized): Secondary | ICD-10-CM | POA: Diagnosis not present

## 2015-12-15 DIAGNOSIS — G5602 Carpal tunnel syndrome, left upper limb: Secondary | ICD-10-CM | POA: Diagnosis not present

## 2015-12-15 DIAGNOSIS — G5601 Carpal tunnel syndrome, right upper limb: Secondary | ICD-10-CM | POA: Diagnosis not present

## 2015-12-19 DIAGNOSIS — R262 Difficulty in walking, not elsewhere classified: Secondary | ICD-10-CM | POA: Diagnosis not present

## 2015-12-19 DIAGNOSIS — G5602 Carpal tunnel syndrome, left upper limb: Secondary | ICD-10-CM | POA: Diagnosis not present

## 2015-12-19 DIAGNOSIS — G5601 Carpal tunnel syndrome, right upper limb: Secondary | ICD-10-CM | POA: Diagnosis not present

## 2015-12-19 DIAGNOSIS — M25561 Pain in right knee: Secondary | ICD-10-CM | POA: Diagnosis not present

## 2015-12-19 DIAGNOSIS — M6281 Muscle weakness (generalized): Secondary | ICD-10-CM | POA: Diagnosis not present

## 2015-12-19 DIAGNOSIS — M25562 Pain in left knee: Secondary | ICD-10-CM | POA: Diagnosis not present

## 2015-12-20 DIAGNOSIS — G5601 Carpal tunnel syndrome, right upper limb: Secondary | ICD-10-CM | POA: Diagnosis not present

## 2015-12-20 DIAGNOSIS — M6281 Muscle weakness (generalized): Secondary | ICD-10-CM | POA: Diagnosis not present

## 2015-12-20 DIAGNOSIS — M25561 Pain in right knee: Secondary | ICD-10-CM | POA: Diagnosis not present

## 2015-12-20 DIAGNOSIS — M25562 Pain in left knee: Secondary | ICD-10-CM | POA: Diagnosis not present

## 2015-12-20 DIAGNOSIS — R262 Difficulty in walking, not elsewhere classified: Secondary | ICD-10-CM | POA: Diagnosis not present

## 2015-12-20 DIAGNOSIS — G5602 Carpal tunnel syndrome, left upper limb: Secondary | ICD-10-CM | POA: Diagnosis not present

## 2015-12-21 DIAGNOSIS — R262 Difficulty in walking, not elsewhere classified: Secondary | ICD-10-CM | POA: Diagnosis not present

## 2015-12-21 DIAGNOSIS — G5602 Carpal tunnel syndrome, left upper limb: Secondary | ICD-10-CM | POA: Diagnosis not present

## 2015-12-21 DIAGNOSIS — G5601 Carpal tunnel syndrome, right upper limb: Secondary | ICD-10-CM | POA: Diagnosis not present

## 2015-12-21 DIAGNOSIS — M25562 Pain in left knee: Secondary | ICD-10-CM | POA: Diagnosis not present

## 2015-12-21 DIAGNOSIS — M6281 Muscle weakness (generalized): Secondary | ICD-10-CM | POA: Diagnosis not present

## 2015-12-21 DIAGNOSIS — M25561 Pain in right knee: Secondary | ICD-10-CM | POA: Diagnosis not present

## 2015-12-22 DIAGNOSIS — M25562 Pain in left knee: Secondary | ICD-10-CM | POA: Diagnosis not present

## 2015-12-22 DIAGNOSIS — G5602 Carpal tunnel syndrome, left upper limb: Secondary | ICD-10-CM | POA: Diagnosis not present

## 2015-12-22 DIAGNOSIS — G5601 Carpal tunnel syndrome, right upper limb: Secondary | ICD-10-CM | POA: Diagnosis not present

## 2015-12-22 DIAGNOSIS — M6281 Muscle weakness (generalized): Secondary | ICD-10-CM | POA: Diagnosis not present

## 2015-12-22 DIAGNOSIS — M25561 Pain in right knee: Secondary | ICD-10-CM | POA: Diagnosis not present

## 2015-12-22 DIAGNOSIS — R262 Difficulty in walking, not elsewhere classified: Secondary | ICD-10-CM | POA: Diagnosis not present

## 2015-12-25 DIAGNOSIS — M25561 Pain in right knee: Secondary | ICD-10-CM | POA: Diagnosis not present

## 2015-12-25 DIAGNOSIS — R278 Other lack of coordination: Secondary | ICD-10-CM | POA: Diagnosis not present

## 2015-12-25 DIAGNOSIS — G5602 Carpal tunnel syndrome, left upper limb: Secondary | ICD-10-CM | POA: Diagnosis not present

## 2015-12-25 DIAGNOSIS — G5601 Carpal tunnel syndrome, right upper limb: Secondary | ICD-10-CM | POA: Diagnosis not present

## 2015-12-25 DIAGNOSIS — M25562 Pain in left knee: Secondary | ICD-10-CM | POA: Diagnosis not present

## 2015-12-25 DIAGNOSIS — M6281 Muscle weakness (generalized): Secondary | ICD-10-CM | POA: Diagnosis not present

## 2015-12-25 DIAGNOSIS — R262 Difficulty in walking, not elsewhere classified: Secondary | ICD-10-CM | POA: Diagnosis not present

## 2015-12-26 ENCOUNTER — Ambulatory Visit
Admission: RE | Admit: 2015-12-26 | Discharge: 2015-12-26 | Disposition: A | Discharge status: Home / Self Care | Payer: Medicare Other | Source: Ambulatory Visit | Attending: Geriatric Medicine | Admitting: Geriatric Medicine

## 2015-12-26 ENCOUNTER — Other Ambulatory Visit: Payer: Self-pay | Admitting: Geriatric Medicine

## 2015-12-26 DIAGNOSIS — G5602 Carpal tunnel syndrome, left upper limb: Secondary | ICD-10-CM | POA: Diagnosis not present

## 2015-12-26 DIAGNOSIS — R0609 Other forms of dyspnea: Principal | ICD-10-CM

## 2015-12-26 DIAGNOSIS — R262 Difficulty in walking, not elsewhere classified: Secondary | ICD-10-CM | POA: Diagnosis not present

## 2015-12-26 DIAGNOSIS — M25561 Pain in right knee: Secondary | ICD-10-CM | POA: Diagnosis not present

## 2015-12-26 DIAGNOSIS — M6281 Muscle weakness (generalized): Secondary | ICD-10-CM | POA: Diagnosis not present

## 2015-12-26 DIAGNOSIS — K219 Gastro-esophageal reflux disease without esophagitis: Secondary | ICD-10-CM | POA: Diagnosis not present

## 2015-12-26 DIAGNOSIS — R0602 Shortness of breath: Secondary | ICD-10-CM | POA: Diagnosis not present

## 2015-12-26 DIAGNOSIS — G5601 Carpal tunnel syndrome, right upper limb: Secondary | ICD-10-CM | POA: Diagnosis not present

## 2015-12-26 DIAGNOSIS — M25562 Pain in left knee: Secondary | ICD-10-CM | POA: Diagnosis not present

## 2015-12-27 DIAGNOSIS — M25561 Pain in right knee: Secondary | ICD-10-CM | POA: Diagnosis not present

## 2015-12-27 DIAGNOSIS — M25562 Pain in left knee: Secondary | ICD-10-CM | POA: Diagnosis not present

## 2015-12-27 DIAGNOSIS — G5601 Carpal tunnel syndrome, right upper limb: Secondary | ICD-10-CM | POA: Diagnosis not present

## 2015-12-27 DIAGNOSIS — M6281 Muscle weakness (generalized): Secondary | ICD-10-CM | POA: Diagnosis not present

## 2015-12-27 DIAGNOSIS — G5602 Carpal tunnel syndrome, left upper limb: Secondary | ICD-10-CM | POA: Diagnosis not present

## 2015-12-27 DIAGNOSIS — R262 Difficulty in walking, not elsewhere classified: Secondary | ICD-10-CM | POA: Diagnosis not present

## 2015-12-28 DIAGNOSIS — G5602 Carpal tunnel syndrome, left upper limb: Secondary | ICD-10-CM | POA: Diagnosis not present

## 2015-12-28 DIAGNOSIS — R262 Difficulty in walking, not elsewhere classified: Secondary | ICD-10-CM | POA: Diagnosis not present

## 2015-12-28 DIAGNOSIS — M25561 Pain in right knee: Secondary | ICD-10-CM | POA: Diagnosis not present

## 2015-12-28 DIAGNOSIS — G5601 Carpal tunnel syndrome, right upper limb: Secondary | ICD-10-CM | POA: Diagnosis not present

## 2015-12-28 DIAGNOSIS — M6281 Muscle weakness (generalized): Secondary | ICD-10-CM | POA: Diagnosis not present

## 2015-12-28 DIAGNOSIS — M25562 Pain in left knee: Secondary | ICD-10-CM | POA: Diagnosis not present

## 2015-12-29 DIAGNOSIS — M25562 Pain in left knee: Secondary | ICD-10-CM | POA: Diagnosis not present

## 2015-12-29 DIAGNOSIS — G5601 Carpal tunnel syndrome, right upper limb: Secondary | ICD-10-CM | POA: Diagnosis not present

## 2015-12-29 DIAGNOSIS — M25561 Pain in right knee: Secondary | ICD-10-CM | POA: Diagnosis not present

## 2015-12-29 DIAGNOSIS — M6281 Muscle weakness (generalized): Secondary | ICD-10-CM | POA: Diagnosis not present

## 2015-12-29 DIAGNOSIS — R262 Difficulty in walking, not elsewhere classified: Secondary | ICD-10-CM | POA: Diagnosis not present

## 2015-12-29 DIAGNOSIS — G5602 Carpal tunnel syndrome, left upper limb: Secondary | ICD-10-CM | POA: Diagnosis not present

## 2016-01-01 DIAGNOSIS — G5602 Carpal tunnel syndrome, left upper limb: Secondary | ICD-10-CM | POA: Diagnosis not present

## 2016-01-01 DIAGNOSIS — M6281 Muscle weakness (generalized): Secondary | ICD-10-CM | POA: Diagnosis not present

## 2016-01-01 DIAGNOSIS — R262 Difficulty in walking, not elsewhere classified: Secondary | ICD-10-CM | POA: Diagnosis not present

## 2016-01-01 DIAGNOSIS — M25561 Pain in right knee: Secondary | ICD-10-CM | POA: Diagnosis not present

## 2016-01-01 DIAGNOSIS — G5601 Carpal tunnel syndrome, right upper limb: Secondary | ICD-10-CM | POA: Diagnosis not present

## 2016-01-01 DIAGNOSIS — M25562 Pain in left knee: Secondary | ICD-10-CM | POA: Diagnosis not present

## 2016-01-02 DIAGNOSIS — M6281 Muscle weakness (generalized): Secondary | ICD-10-CM | POA: Diagnosis not present

## 2016-01-02 DIAGNOSIS — G5601 Carpal tunnel syndrome, right upper limb: Secondary | ICD-10-CM | POA: Diagnosis not present

## 2016-01-02 DIAGNOSIS — M25561 Pain in right knee: Secondary | ICD-10-CM | POA: Diagnosis not present

## 2016-01-02 DIAGNOSIS — G5602 Carpal tunnel syndrome, left upper limb: Secondary | ICD-10-CM | POA: Diagnosis not present

## 2016-01-02 DIAGNOSIS — M25562 Pain in left knee: Secondary | ICD-10-CM | POA: Diagnosis not present

## 2016-01-02 DIAGNOSIS — R262 Difficulty in walking, not elsewhere classified: Secondary | ICD-10-CM | POA: Diagnosis not present

## 2016-01-03 DIAGNOSIS — G5601 Carpal tunnel syndrome, right upper limb: Secondary | ICD-10-CM | POA: Diagnosis not present

## 2016-01-03 DIAGNOSIS — M25562 Pain in left knee: Secondary | ICD-10-CM | POA: Diagnosis not present

## 2016-01-03 DIAGNOSIS — M25561 Pain in right knee: Secondary | ICD-10-CM | POA: Diagnosis not present

## 2016-01-03 DIAGNOSIS — M6281 Muscle weakness (generalized): Secondary | ICD-10-CM | POA: Diagnosis not present

## 2016-01-03 DIAGNOSIS — G5602 Carpal tunnel syndrome, left upper limb: Secondary | ICD-10-CM | POA: Diagnosis not present

## 2016-01-03 DIAGNOSIS — R262 Difficulty in walking, not elsewhere classified: Secondary | ICD-10-CM | POA: Diagnosis not present

## 2016-01-04 DIAGNOSIS — G5601 Carpal tunnel syndrome, right upper limb: Secondary | ICD-10-CM | POA: Diagnosis not present

## 2016-01-04 DIAGNOSIS — R262 Difficulty in walking, not elsewhere classified: Secondary | ICD-10-CM | POA: Diagnosis not present

## 2016-01-04 DIAGNOSIS — M6281 Muscle weakness (generalized): Secondary | ICD-10-CM | POA: Diagnosis not present

## 2016-01-04 DIAGNOSIS — G5602 Carpal tunnel syndrome, left upper limb: Secondary | ICD-10-CM | POA: Diagnosis not present

## 2016-01-04 DIAGNOSIS — M25562 Pain in left knee: Secondary | ICD-10-CM | POA: Diagnosis not present

## 2016-01-04 DIAGNOSIS — M25561 Pain in right knee: Secondary | ICD-10-CM | POA: Diagnosis not present

## 2016-01-05 DIAGNOSIS — G5602 Carpal tunnel syndrome, left upper limb: Secondary | ICD-10-CM | POA: Diagnosis not present

## 2016-01-05 DIAGNOSIS — G5601 Carpal tunnel syndrome, right upper limb: Secondary | ICD-10-CM | POA: Diagnosis not present

## 2016-01-05 DIAGNOSIS — R262 Difficulty in walking, not elsewhere classified: Secondary | ICD-10-CM | POA: Diagnosis not present

## 2016-01-05 DIAGNOSIS — M25562 Pain in left knee: Secondary | ICD-10-CM | POA: Diagnosis not present

## 2016-01-05 DIAGNOSIS — M25561 Pain in right knee: Secondary | ICD-10-CM | POA: Diagnosis not present

## 2016-01-05 DIAGNOSIS — M6281 Muscle weakness (generalized): Secondary | ICD-10-CM | POA: Diagnosis not present

## 2016-01-08 DIAGNOSIS — R262 Difficulty in walking, not elsewhere classified: Secondary | ICD-10-CM | POA: Diagnosis not present

## 2016-01-08 DIAGNOSIS — M6281 Muscle weakness (generalized): Secondary | ICD-10-CM | POA: Diagnosis not present

## 2016-01-08 DIAGNOSIS — M25561 Pain in right knee: Secondary | ICD-10-CM | POA: Diagnosis not present

## 2016-01-08 DIAGNOSIS — G5602 Carpal tunnel syndrome, left upper limb: Secondary | ICD-10-CM | POA: Diagnosis not present

## 2016-01-08 DIAGNOSIS — G5601 Carpal tunnel syndrome, right upper limb: Secondary | ICD-10-CM | POA: Diagnosis not present

## 2016-01-08 DIAGNOSIS — M25562 Pain in left knee: Secondary | ICD-10-CM | POA: Diagnosis not present

## 2016-01-10 DIAGNOSIS — M6281 Muscle weakness (generalized): Secondary | ICD-10-CM | POA: Diagnosis not present

## 2016-01-10 DIAGNOSIS — R262 Difficulty in walking, not elsewhere classified: Secondary | ICD-10-CM | POA: Diagnosis not present

## 2016-01-10 DIAGNOSIS — G5602 Carpal tunnel syndrome, left upper limb: Secondary | ICD-10-CM | POA: Diagnosis not present

## 2016-01-10 DIAGNOSIS — G5601 Carpal tunnel syndrome, right upper limb: Secondary | ICD-10-CM | POA: Diagnosis not present

## 2016-01-10 DIAGNOSIS — M25561 Pain in right knee: Secondary | ICD-10-CM | POA: Diagnosis not present

## 2016-01-10 DIAGNOSIS — M25562 Pain in left knee: Secondary | ICD-10-CM | POA: Diagnosis not present

## 2016-01-12 DIAGNOSIS — M25561 Pain in right knee: Secondary | ICD-10-CM | POA: Diagnosis not present

## 2016-01-12 DIAGNOSIS — M6281 Muscle weakness (generalized): Secondary | ICD-10-CM | POA: Diagnosis not present

## 2016-01-12 DIAGNOSIS — G5601 Carpal tunnel syndrome, right upper limb: Secondary | ICD-10-CM | POA: Diagnosis not present

## 2016-01-12 DIAGNOSIS — M25562 Pain in left knee: Secondary | ICD-10-CM | POA: Diagnosis not present

## 2016-01-12 DIAGNOSIS — R262 Difficulty in walking, not elsewhere classified: Secondary | ICD-10-CM | POA: Diagnosis not present

## 2016-01-12 DIAGNOSIS — G5602 Carpal tunnel syndrome, left upper limb: Secondary | ICD-10-CM | POA: Diagnosis not present

## 2016-01-16 DIAGNOSIS — M6281 Muscle weakness (generalized): Secondary | ICD-10-CM | POA: Diagnosis not present

## 2016-01-16 DIAGNOSIS — R262 Difficulty in walking, not elsewhere classified: Secondary | ICD-10-CM | POA: Diagnosis not present

## 2016-01-16 DIAGNOSIS — G5601 Carpal tunnel syndrome, right upper limb: Secondary | ICD-10-CM | POA: Diagnosis not present

## 2016-01-16 DIAGNOSIS — G5602 Carpal tunnel syndrome, left upper limb: Secondary | ICD-10-CM | POA: Diagnosis not present

## 2016-01-16 DIAGNOSIS — M25562 Pain in left knee: Secondary | ICD-10-CM | POA: Diagnosis not present

## 2016-01-16 DIAGNOSIS — M25561 Pain in right knee: Secondary | ICD-10-CM | POA: Diagnosis not present

## 2016-01-17 DIAGNOSIS — M25561 Pain in right knee: Secondary | ICD-10-CM | POA: Diagnosis not present

## 2016-01-17 DIAGNOSIS — G5601 Carpal tunnel syndrome, right upper limb: Secondary | ICD-10-CM | POA: Diagnosis not present

## 2016-01-17 DIAGNOSIS — M25562 Pain in left knee: Secondary | ICD-10-CM | POA: Diagnosis not present

## 2016-01-17 DIAGNOSIS — M6281 Muscle weakness (generalized): Secondary | ICD-10-CM | POA: Diagnosis not present

## 2016-01-17 DIAGNOSIS — G5602 Carpal tunnel syndrome, left upper limb: Secondary | ICD-10-CM | POA: Diagnosis not present

## 2016-01-17 DIAGNOSIS — R262 Difficulty in walking, not elsewhere classified: Secondary | ICD-10-CM | POA: Diagnosis not present

## 2016-01-19 DIAGNOSIS — G5602 Carpal tunnel syndrome, left upper limb: Secondary | ICD-10-CM | POA: Diagnosis not present

## 2016-01-19 DIAGNOSIS — R262 Difficulty in walking, not elsewhere classified: Secondary | ICD-10-CM | POA: Diagnosis not present

## 2016-01-19 DIAGNOSIS — G5601 Carpal tunnel syndrome, right upper limb: Secondary | ICD-10-CM | POA: Diagnosis not present

## 2016-01-19 DIAGNOSIS — M25562 Pain in left knee: Secondary | ICD-10-CM | POA: Diagnosis not present

## 2016-01-19 DIAGNOSIS — M6281 Muscle weakness (generalized): Secondary | ICD-10-CM | POA: Diagnosis not present

## 2016-01-19 DIAGNOSIS — M25561 Pain in right knee: Secondary | ICD-10-CM | POA: Diagnosis not present

## 2016-01-23 DIAGNOSIS — G5601 Carpal tunnel syndrome, right upper limb: Secondary | ICD-10-CM | POA: Diagnosis not present

## 2016-01-23 DIAGNOSIS — M25562 Pain in left knee: Secondary | ICD-10-CM | POA: Diagnosis not present

## 2016-01-23 DIAGNOSIS — G5602 Carpal tunnel syndrome, left upper limb: Secondary | ICD-10-CM | POA: Diagnosis not present

## 2016-01-23 DIAGNOSIS — M6281 Muscle weakness (generalized): Secondary | ICD-10-CM | POA: Diagnosis not present

## 2016-01-23 DIAGNOSIS — M25561 Pain in right knee: Secondary | ICD-10-CM | POA: Diagnosis not present

## 2016-01-23 DIAGNOSIS — R262 Difficulty in walking, not elsewhere classified: Secondary | ICD-10-CM | POA: Diagnosis not present

## 2016-01-25 DIAGNOSIS — M25561 Pain in right knee: Secondary | ICD-10-CM | POA: Diagnosis not present

## 2016-01-25 DIAGNOSIS — M25562 Pain in left knee: Secondary | ICD-10-CM | POA: Diagnosis not present

## 2016-01-25 DIAGNOSIS — R262 Difficulty in walking, not elsewhere classified: Secondary | ICD-10-CM | POA: Diagnosis not present

## 2016-01-26 DIAGNOSIS — M25561 Pain in right knee: Secondary | ICD-10-CM | POA: Diagnosis not present

## 2016-01-26 DIAGNOSIS — M25562 Pain in left knee: Secondary | ICD-10-CM | POA: Diagnosis not present

## 2016-01-26 DIAGNOSIS — R262 Difficulty in walking, not elsewhere classified: Secondary | ICD-10-CM | POA: Diagnosis not present

## 2016-01-30 DIAGNOSIS — M25562 Pain in left knee: Secondary | ICD-10-CM | POA: Diagnosis not present

## 2016-01-30 DIAGNOSIS — R262 Difficulty in walking, not elsewhere classified: Secondary | ICD-10-CM | POA: Diagnosis not present

## 2016-01-30 DIAGNOSIS — M25561 Pain in right knee: Secondary | ICD-10-CM | POA: Diagnosis not present

## 2016-02-01 DIAGNOSIS — M25562 Pain in left knee: Secondary | ICD-10-CM | POA: Diagnosis not present

## 2016-02-01 DIAGNOSIS — R262 Difficulty in walking, not elsewhere classified: Secondary | ICD-10-CM | POA: Diagnosis not present

## 2016-02-01 DIAGNOSIS — M25561 Pain in right knee: Secondary | ICD-10-CM | POA: Diagnosis not present

## 2016-02-02 DIAGNOSIS — M25561 Pain in right knee: Secondary | ICD-10-CM | POA: Diagnosis not present

## 2016-02-02 DIAGNOSIS — M25562 Pain in left knee: Secondary | ICD-10-CM | POA: Diagnosis not present

## 2016-02-02 DIAGNOSIS — R262 Difficulty in walking, not elsewhere classified: Secondary | ICD-10-CM | POA: Diagnosis not present

## 2016-02-06 DIAGNOSIS — M25562 Pain in left knee: Secondary | ICD-10-CM | POA: Diagnosis not present

## 2016-02-06 DIAGNOSIS — M25561 Pain in right knee: Secondary | ICD-10-CM | POA: Diagnosis not present

## 2016-02-06 DIAGNOSIS — R262 Difficulty in walking, not elsewhere classified: Secondary | ICD-10-CM | POA: Diagnosis not present

## 2016-02-08 DIAGNOSIS — M25562 Pain in left knee: Secondary | ICD-10-CM | POA: Diagnosis not present

## 2016-02-08 DIAGNOSIS — R262 Difficulty in walking, not elsewhere classified: Secondary | ICD-10-CM | POA: Diagnosis not present

## 2016-02-08 DIAGNOSIS — M25561 Pain in right knee: Secondary | ICD-10-CM | POA: Diagnosis not present

## 2016-02-09 DIAGNOSIS — R262 Difficulty in walking, not elsewhere classified: Secondary | ICD-10-CM | POA: Diagnosis not present

## 2016-02-09 DIAGNOSIS — M25562 Pain in left knee: Secondary | ICD-10-CM | POA: Diagnosis not present

## 2016-02-09 DIAGNOSIS — M25561 Pain in right knee: Secondary | ICD-10-CM | POA: Diagnosis not present

## 2016-02-13 DIAGNOSIS — M25561 Pain in right knee: Secondary | ICD-10-CM | POA: Diagnosis not present

## 2016-02-13 DIAGNOSIS — R262 Difficulty in walking, not elsewhere classified: Secondary | ICD-10-CM | POA: Diagnosis not present

## 2016-02-13 DIAGNOSIS — M25562 Pain in left knee: Secondary | ICD-10-CM | POA: Diagnosis not present

## 2016-02-16 DIAGNOSIS — M25561 Pain in right knee: Secondary | ICD-10-CM | POA: Diagnosis not present

## 2016-02-16 DIAGNOSIS — M25562 Pain in left knee: Secondary | ICD-10-CM | POA: Diagnosis not present

## 2016-02-16 DIAGNOSIS — R262 Difficulty in walking, not elsewhere classified: Secondary | ICD-10-CM | POA: Diagnosis not present

## 2016-02-20 DIAGNOSIS — R262 Difficulty in walking, not elsewhere classified: Secondary | ICD-10-CM | POA: Diagnosis not present

## 2016-02-20 DIAGNOSIS — M25561 Pain in right knee: Secondary | ICD-10-CM | POA: Diagnosis not present

## 2016-02-20 DIAGNOSIS — M25562 Pain in left knee: Secondary | ICD-10-CM | POA: Diagnosis not present

## 2016-02-22 DIAGNOSIS — M25562 Pain in left knee: Secondary | ICD-10-CM | POA: Diagnosis not present

## 2016-02-22 DIAGNOSIS — M25561 Pain in right knee: Secondary | ICD-10-CM | POA: Diagnosis not present

## 2016-02-22 DIAGNOSIS — R262 Difficulty in walking, not elsewhere classified: Secondary | ICD-10-CM | POA: Diagnosis not present

## 2016-02-29 DIAGNOSIS — M25561 Pain in right knee: Secondary | ICD-10-CM | POA: Diagnosis not present

## 2016-02-29 DIAGNOSIS — M25562 Pain in left knee: Secondary | ICD-10-CM | POA: Diagnosis not present

## 2016-02-29 DIAGNOSIS — R262 Difficulty in walking, not elsewhere classified: Secondary | ICD-10-CM | POA: Diagnosis not present

## 2016-03-01 DIAGNOSIS — M25562 Pain in left knee: Secondary | ICD-10-CM | POA: Diagnosis not present

## 2016-03-01 DIAGNOSIS — R262 Difficulty in walking, not elsewhere classified: Secondary | ICD-10-CM | POA: Diagnosis not present

## 2016-03-01 DIAGNOSIS — M25561 Pain in right knee: Secondary | ICD-10-CM | POA: Diagnosis not present

## 2016-03-05 DIAGNOSIS — M25562 Pain in left knee: Secondary | ICD-10-CM | POA: Diagnosis not present

## 2016-03-05 DIAGNOSIS — M25561 Pain in right knee: Secondary | ICD-10-CM | POA: Diagnosis not present

## 2016-03-05 DIAGNOSIS — R262 Difficulty in walking, not elsewhere classified: Secondary | ICD-10-CM | POA: Diagnosis not present

## 2016-03-07 DIAGNOSIS — M25562 Pain in left knee: Secondary | ICD-10-CM | POA: Diagnosis not present

## 2016-03-07 DIAGNOSIS — M25561 Pain in right knee: Secondary | ICD-10-CM | POA: Diagnosis not present

## 2016-03-07 DIAGNOSIS — R262 Difficulty in walking, not elsewhere classified: Secondary | ICD-10-CM | POA: Diagnosis not present

## 2016-03-12 DIAGNOSIS — M25561 Pain in right knee: Secondary | ICD-10-CM | POA: Diagnosis not present

## 2016-03-12 DIAGNOSIS — M25562 Pain in left knee: Secondary | ICD-10-CM | POA: Diagnosis not present

## 2016-03-12 DIAGNOSIS — R262 Difficulty in walking, not elsewhere classified: Secondary | ICD-10-CM | POA: Diagnosis not present

## 2016-03-14 DIAGNOSIS — M25561 Pain in right knee: Secondary | ICD-10-CM | POA: Diagnosis not present

## 2016-03-14 DIAGNOSIS — M25562 Pain in left knee: Secondary | ICD-10-CM | POA: Diagnosis not present

## 2016-03-14 DIAGNOSIS — R262 Difficulty in walking, not elsewhere classified: Secondary | ICD-10-CM | POA: Diagnosis not present

## 2016-03-19 DIAGNOSIS — R262 Difficulty in walking, not elsewhere classified: Secondary | ICD-10-CM | POA: Diagnosis not present

## 2016-03-19 DIAGNOSIS — M25562 Pain in left knee: Secondary | ICD-10-CM | POA: Diagnosis not present

## 2016-03-19 DIAGNOSIS — M25561 Pain in right knee: Secondary | ICD-10-CM | POA: Diagnosis not present

## 2016-03-25 DIAGNOSIS — F5101 Primary insomnia: Secondary | ICD-10-CM | POA: Diagnosis not present

## 2016-03-25 DIAGNOSIS — R5383 Other fatigue: Secondary | ICD-10-CM | POA: Diagnosis not present

## 2016-03-25 DIAGNOSIS — Z79899 Other long term (current) drug therapy: Secondary | ICD-10-CM | POA: Diagnosis not present

## 2016-03-25 DIAGNOSIS — M2450 Contracture, unspecified joint: Secondary | ICD-10-CM | POA: Diagnosis not present

## 2016-03-25 DIAGNOSIS — M810 Age-related osteoporosis without current pathological fracture: Secondary | ICD-10-CM | POA: Diagnosis not present

## 2016-03-25 DIAGNOSIS — E039 Hypothyroidism, unspecified: Secondary | ICD-10-CM | POA: Diagnosis not present

## 2016-05-16 DIAGNOSIS — Z23 Encounter for immunization: Secondary | ICD-10-CM | POA: Diagnosis not present

## 2016-05-20 DIAGNOSIS — E039 Hypothyroidism, unspecified: Secondary | ICD-10-CM | POA: Diagnosis not present

## 2016-09-18 DIAGNOSIS — Z1389 Encounter for screening for other disorder: Secondary | ICD-10-CM | POA: Diagnosis not present

## 2016-09-18 DIAGNOSIS — E039 Hypothyroidism, unspecified: Secondary | ICD-10-CM | POA: Diagnosis not present

## 2016-09-18 DIAGNOSIS — Z0000 Encounter for general adult medical examination without abnormal findings: Secondary | ICD-10-CM | POA: Diagnosis not present

## 2016-09-18 DIAGNOSIS — G5601 Carpal tunnel syndrome, right upper limb: Secondary | ICD-10-CM | POA: Diagnosis not present

## 2016-09-18 DIAGNOSIS — Z79899 Other long term (current) drug therapy: Secondary | ICD-10-CM | POA: Diagnosis not present

## 2016-09-18 DIAGNOSIS — Z23 Encounter for immunization: Secondary | ICD-10-CM | POA: Diagnosis not present

## 2016-09-18 DIAGNOSIS — F5101 Primary insomnia: Secondary | ICD-10-CM | POA: Diagnosis not present

## 2016-09-25 DIAGNOSIS — G5603 Carpal tunnel syndrome, bilateral upper limbs: Secondary | ICD-10-CM | POA: Diagnosis not present

## 2016-09-25 DIAGNOSIS — M19042 Primary osteoarthritis, left hand: Secondary | ICD-10-CM | POA: Diagnosis not present

## 2016-09-25 DIAGNOSIS — M19041 Primary osteoarthritis, right hand: Secondary | ICD-10-CM | POA: Diagnosis not present

## 2016-09-25 DIAGNOSIS — M720 Palmar fascial fibromatosis [Dupuytren]: Secondary | ICD-10-CM | POA: Diagnosis not present

## 2016-09-26 ENCOUNTER — Other Ambulatory Visit: Payer: Self-pay | Admitting: Orthopedic Surgery

## 2016-10-09 ENCOUNTER — Encounter (HOSPITAL_BASED_OUTPATIENT_CLINIC_OR_DEPARTMENT_OTHER): Payer: Self-pay | Admitting: *Deleted

## 2016-10-15 ENCOUNTER — Ambulatory Visit (HOSPITAL_BASED_OUTPATIENT_CLINIC_OR_DEPARTMENT_OTHER)
Admission: RE | Admit: 2016-10-15 | Discharge: 2016-10-15 | Disposition: A | Discharge status: Home / Self Care | Payer: Medicare Other | Source: Ambulatory Visit | Attending: Orthopedic Surgery | Admitting: Orthopedic Surgery

## 2016-10-15 ENCOUNTER — Ambulatory Visit (HOSPITAL_BASED_OUTPATIENT_CLINIC_OR_DEPARTMENT_OTHER): Payer: Medicare Other | Admitting: Anesthesiology

## 2016-10-15 ENCOUNTER — Encounter (HOSPITAL_BASED_OUTPATIENT_CLINIC_OR_DEPARTMENT_OTHER)
Admission: RE | Disposition: A | Discharge status: Home / Self Care | Payer: Self-pay | Source: Ambulatory Visit | Attending: Orthopedic Surgery

## 2016-10-15 ENCOUNTER — Encounter (HOSPITAL_BASED_OUTPATIENT_CLINIC_OR_DEPARTMENT_OTHER): Payer: Self-pay | Admitting: Anesthesiology

## 2016-10-15 DIAGNOSIS — E7800 Pure hypercholesterolemia, unspecified: Secondary | ICD-10-CM | POA: Insufficient documentation

## 2016-10-15 DIAGNOSIS — G5602 Carpal tunnel syndrome, left upper limb: Secondary | ICD-10-CM | POA: Insufficient documentation

## 2016-10-15 DIAGNOSIS — E039 Hypothyroidism, unspecified: Secondary | ICD-10-CM | POA: Diagnosis not present

## 2016-10-15 HISTORY — PX: CARPAL TUNNEL RELEASE: SHX101

## 2016-10-15 SURGERY — CARPAL TUNNEL RELEASE
Anesthesia: Regional | Site: Wrist | Laterality: Left

## 2016-10-15 MED ORDER — FENTANYL CITRATE (PF) 100 MCG/2ML IJ SOLN
50.0000 ug | INTRAMUSCULAR | Status: DC | PRN
  Administered 2016-10-15 (×2): 25 ug via INTRAVENOUS

## 2016-10-15 MED ORDER — OXYCODONE HCL 5 MG PO TABS: 5.0000 mg | ORAL_TABLET | Freq: Once | ORAL | Status: DC | PRN

## 2016-10-15 MED ORDER — CEFAZOLIN SODIUM-DEXTROSE 2-4 GM/100ML-% IV SOLN
INTRAVENOUS | Status: AC
  Filled 2016-10-15: qty 100

## 2016-10-15 MED ORDER — SCOPOLAMINE 1 MG/3DAYS TD PT72: 1.0000 | MEDICATED_PATCH | Freq: Once | TRANSDERMAL | Status: DC | PRN

## 2016-10-15 MED ORDER — LACTATED RINGERS IV SOLN: INTRAVENOUS | Status: DC

## 2016-10-15 MED ORDER — OXYCODONE HCL 5 MG/5ML PO SOLN: 5.0000 mg | Freq: Once | ORAL | Status: DC | PRN

## 2016-10-15 MED ORDER — ONDANSETRON HCL 4 MG/2ML IJ SOLN
INTRAMUSCULAR | Status: DC | PRN
  Administered 2016-10-15: 4 mg via INTRAVENOUS

## 2016-10-15 MED ORDER — CEFAZOLIN SODIUM-DEXTROSE 2-4 GM/100ML-% IV SOLN
2.0000 g | INTRAVENOUS | Status: AC
  Administered 2016-10-15: 2 g via INTRAVENOUS

## 2016-10-15 MED ORDER — FENTANYL CITRATE (PF) 100 MCG/2ML IJ SOLN
INTRAMUSCULAR | Status: AC
  Filled 2016-10-15: qty 2

## 2016-10-15 MED ORDER — MIDAZOLAM HCL 2 MG/2ML IJ SOLN: 1.0000 mg | INTRAMUSCULAR | Status: DC | PRN

## 2016-10-15 MED ORDER — CHLORHEXIDINE GLUCONATE 4 % EX LIQD: 60.0000 mL | Freq: Once | CUTANEOUS | Status: DC

## 2016-10-15 MED ORDER — PROPOFOL 500 MG/50ML IV EMUL
INTRAVENOUS | Status: DC | PRN
  Administered 2016-10-15: 50 ug/kg/min via INTRAVENOUS

## 2016-10-15 MED ORDER — ONDANSETRON HCL 4 MG/2ML IJ SOLN
INTRAMUSCULAR | Status: AC
  Filled 2016-10-15: qty 2

## 2016-10-15 MED ORDER — BUPIVACAINE HCL (PF) 0.25 % IJ SOLN
INTRAMUSCULAR | Status: DC | PRN
  Administered 2016-10-15: 8 mL

## 2016-10-15 MED ORDER — FENTANYL CITRATE (PF) 100 MCG/2ML IJ SOLN: 25.0000 ug | INTRAMUSCULAR | Status: DC | PRN

## 2016-10-15 MED ORDER — LACTATED RINGERS IV SOLN
INTRAVENOUS | Status: DC
  Administered 2016-10-15: 11:00:00 via INTRAVENOUS

## 2016-10-15 MED ORDER — LIDOCAINE HCL (PF) 0.5 % IJ SOLN
INTRAMUSCULAR | Status: DC | PRN
  Administered 2016-10-15: 30 mL via INTRAVENOUS

## 2016-10-15 MED ORDER — HYDROCODONE-ACETAMINOPHEN 5-325 MG PO TABS: 1.0000 | ORAL_TABLET | Freq: Four times a day (QID) | ORAL | 0 refills | Status: DC | PRN

## 2016-10-15 SURGICAL SUPPLY — 40 items
BLADE SURG 15 STRL LF DISP TIS (BLADE) ×1 IMPLANT
BLADE SURG 15 STRL SS (BLADE) ×2
BNDG COHESIVE 3X5 TAN STRL LF (GAUZE/BANDAGES/DRESSINGS) ×3 IMPLANT
BNDG ESMARK 4X9 LF (GAUZE/BANDAGES/DRESSINGS) IMPLANT
BNDG GAUZE ELAST 4 BULKY (GAUZE/BANDAGES/DRESSINGS) ×3 IMPLANT
CHLORAPREP W/TINT 26ML (MISCELLANEOUS) ×3 IMPLANT
CORDS BIPOLAR (ELECTRODE) ×3 IMPLANT
COVER BACK TABLE 60X90IN (DRAPES) ×3 IMPLANT
COVER MAYO STAND STRL (DRAPES) ×3 IMPLANT
CUFF TOURNIQUET SINGLE 18IN (TOURNIQUET CUFF) ×3 IMPLANT
DRAPE EXTREMITY T 121X128X90 (DRAPE) ×3 IMPLANT
DRAPE SURG 17X23 STRL (DRAPES) ×3 IMPLANT
DRSG PAD ABDOMINAL 8X10 ST (GAUZE/BANDAGES/DRESSINGS) ×3 IMPLANT
GAUZE SPONGE 4X4 12PLY STRL (GAUZE/BANDAGES/DRESSINGS) ×3 IMPLANT
GAUZE XEROFORM 1X8 LF (GAUZE/BANDAGES/DRESSINGS) ×3 IMPLANT
GLOVE BIOGEL PI IND STRL 6.5 (GLOVE) ×1 IMPLANT
GLOVE BIOGEL PI IND STRL 7.0 (GLOVE) ×1 IMPLANT
GLOVE BIOGEL PI IND STRL 7.5 (GLOVE) ×1 IMPLANT
GLOVE BIOGEL PI IND STRL 8.5 (GLOVE) ×1 IMPLANT
GLOVE BIOGEL PI INDICATOR 6.5 (GLOVE) ×2
GLOVE BIOGEL PI INDICATOR 7.0 (GLOVE) ×2
GLOVE BIOGEL PI INDICATOR 7.5 (GLOVE) ×2
GLOVE BIOGEL PI INDICATOR 8.5 (GLOVE) ×2
GLOVE SURG ORTHO 8.0 STRL STRW (GLOVE) ×3 IMPLANT
GLOVE SURG SS PI 6.5 STRL IVOR (GLOVE) ×3 IMPLANT
GLOVE SURG SYN 7.5  E (GLOVE) ×2
GLOVE SURG SYN 7.5 E (GLOVE) ×1 IMPLANT
GOWN STRL REUS W/ TWL LRG LVL3 (GOWN DISPOSABLE) ×2 IMPLANT
GOWN STRL REUS W/TWL LRG LVL3 (GOWN DISPOSABLE) ×4
GOWN STRL REUS W/TWL XL LVL3 (GOWN DISPOSABLE) ×3 IMPLANT
NEEDLE PRECISIONGLIDE 27X1.5 (NEEDLE) ×3 IMPLANT
NS IRRIG 1000ML POUR BTL (IV SOLUTION) ×3 IMPLANT
PACK BASIN DAY SURGERY FS (CUSTOM PROCEDURE TRAY) ×3 IMPLANT
STOCKINETTE 4X48 STRL (DRAPES) ×3 IMPLANT
SUT ETHILON 4 0 PS 2 18 (SUTURE) ×3 IMPLANT
SUT VICRYL 4-0 PS2 18IN ABS (SUTURE) IMPLANT
SYR BULB 3OZ (MISCELLANEOUS) ×3 IMPLANT
SYR CONTROL 10ML LL (SYRINGE) ×3 IMPLANT
TOWEL OR 17X24 6PK STRL BLUE (TOWEL DISPOSABLE) ×3 IMPLANT
UNDERPAD 30X30 (UNDERPADS AND DIAPERS) ×3 IMPLANT

## 2016-10-15 NOTE — H&P (Signed)
  Madison Marquez is an 83 y.o. female.   Chief Complaint:numbness  HPI: Madison Marquez is an 83 year old right hand dominant female referred by Dr. Lajean Manes for consultation. She is complaining of numbness and tingling, burning her hands, left much greater than right. This has been going on for years. She states it is intermittent at the present time and involves all of her fingers. She has no history of injury to the hand or to the neck, it does not awaken her at night. Driving seems to increase this for her, she will occasionally with just sitting have her fingers go numb. She has not had any treatment. She states that she has had a catching finger release on the left side causing flexion deformity to occur in her left small finger. She has a history of thyroid problems, arthritis in her knee, no history of diabetes or gout. There is family history of diabetes. No history of thyroid problems, arthritis or gout. She has been tested for diabetes. She relates nothing making it significantly better or worse for her. He states that it is intermittent in nature. She has had nerve conductions done by Dr. Domingo Cocking revealing a significant carpal tunnel syndrome with no response on her left side and a motor delay of 8.6 this. She has a motor delay of 4.9 on her right. Sensory delay of 3.84 on her sensory component.      Past Medical History:  Diagnosis Date  . Hypercholesterolemia   . Hypothyroid     Past Surgical History:  Procedure Laterality Date  . HERNIA REPAIR      History reviewed. No pertinent family history. Social History:  reports that she has never smoked. She has never used smokeless tobacco. She reports that she does not drink alcohol or use drugs.  Allergies: No Known Allergies  No prescriptions prior to admission.    No results found for this or any previous visit (from the past 48 hour(s)).  No results found.   Pertinent items are noted in HPI.  Height 5\' 5"  (1.651 m),  weight 72.6 kg (160 lb).  General appearance: alert, cooperative and appears stated age Head: Normocephalic, without obvious abnormality Neck: no JVD Resp: clear to auscultation bilaterally Cardio: regular rate and rhythm, S1, S2 normal, no murmur, click, rub or gallop GI: soft, non-tender; bowel sounds normal; no masses,  no organomegaly Extremities: numbness left hand Pulses: 2+ and symmetric Skin: Skin color, texture, turgor normal. No rashes or lesions Neurologic: Grossly normal Incision/Wound: na  Assessment/Plan Assessment:  1. Carpal tunnel syndrome on right  2. Carpal tunnel syndrome on left  3. Primary osteoarthritis of both hands  4. Primary osteoarthritis of both first carpometacarpal joints    Plan: PLAN: We have discussed the carpal tunnel syndrome with her. We recommend to her serious consideration be given to release of the carpal canal. She is preparing to move and does not want to have anything done at the present time. We would not recommend any treatment to the Dupuytren's contracture to her small finger. She is advised that there is always potential for increased symptoms on her left side with carpal tunnel release. This may activate the remaining Dupuytren's cords. She      Nerida Boivin R 10/15/2016, 8:28 AM

## 2016-10-15 NOTE — Transfer of Care (Signed)
Immediate Anesthesia Transfer of Care Note  Patient: Madison Marquez  Procedure(s) Performed: Procedure(s): LEFT CARPAL TUNNEL RELEASE (Left)  Patient Location: PACU  Anesthesia Type:Bier block  Level of Consciousness: awake, oriented and sedated  Airway & Oxygen Therapy: Patient Spontanous Breathing and Patient connected to face mask oxygen  Post-op Assessment: Report given to RN and Post -op Vital signs reviewed and stable  Post vital signs: Reviewed and stable  Last Vitals:  Vitals:   10/15/16 1017  BP: (!) 152/56  Pulse: 76  Resp: 18  Temp: 36.6 C    Last Pain:  Vitals:   10/15/16 1017  TempSrc: Oral         Complications: No apparent anesthesia complications

## 2016-10-15 NOTE — Op Note (Signed)
Dictation Number [id number]

## 2016-10-15 NOTE — Anesthesia Preprocedure Evaluation (Signed)
Anesthesia Evaluation  Patient identified by MRN, date of birth, ID band Patient awake    Reviewed: Allergy & Precautions, NPO status , Patient's Chart, lab work & pertinent test results  Airway Mallampati: I  TM Distance: >3 FB Neck ROM: Full    Dental  (+) Teeth Intact, Dental Advisory Given   Pulmonary neg pulmonary ROS,    breath sounds clear to auscultation       Cardiovascular negative cardio ROS   Rhythm:Regular Rate:Normal     Neuro/Psych negative neurological ROS  negative psych ROS   GI/Hepatic negative GI ROS, Neg liver ROS,   Endo/Other  Hypothyroidism   Renal/GU negative Renal ROS  negative genitourinary   Musculoskeletal negative musculoskeletal ROS (+)   Abdominal   Peds negative pediatric ROS (+)  Hematology negative hematology ROS (+)   Anesthesia Other Findings   Reproductive/Obstetrics negative OB ROS                             Anesthesia Physical Anesthesia Plan  ASA: II  Anesthesia Plan: Bier Block   Post-op Pain Management:    Induction: Intravenous  Airway Management Planned: Natural Airway  Additional Equipment:   Intra-op Plan:   Post-operative Plan:   Informed Consent: I have reviewed the patients History and Physical, chart, labs and discussed the procedure including the risks, benefits and alternatives for the proposed anesthesia with the patient or authorized representative who has indicated his/her understanding and acceptance.     Plan Discussed with: CRNA  Anesthesia Plan Comments:         Anesthesia Quick Evaluation

## 2016-10-15 NOTE — Anesthesia Postprocedure Evaluation (Addendum)
Anesthesia Post Note  Patient: Madison Marquez  Procedure(s) Performed: Procedure(s) (LRB): LEFT CARPAL TUNNEL RELEASE (Left)  Patient location during evaluation: PACU Anesthesia Type: Bier Block Level of consciousness: awake and alert Pain management: pain level controlled Vital Signs Assessment: post-procedure vital signs reviewed and stable Respiratory status: spontaneous breathing, nonlabored ventilation, respiratory function stable and patient connected to nasal cannula oxygen Cardiovascular status: stable and blood pressure returned to baseline Anesthetic complications: no       Last Vitals:  Vitals:   10/15/16 1130 10/15/16 1200  BP: 140/63 (!) 147/64  Pulse: 65 75  Resp: 17 16  Temp:  36.5 C    Last Pain:  Vitals:   10/15/16 1130  TempSrc:   PainSc: 0-No pain                 Effie Berkshire

## 2016-10-15 NOTE — Discharge Instructions (Signed)

## 2016-10-15 NOTE — Brief Op Note (Signed)
10/15/2016  11:08 AM  PATIENT:  Roselind Messier  83 y.o. female  PRE-OPERATIVE DIAGNOSIS:  left carpal tunnel syndrome G56.02  POST-OPERATIVE DIAGNOSIS:  left carpal tunnel syndrome G56.02  PROCEDURE:  Procedure(s): LEFT CARPAL TUNNEL RELEASE (Left)  SURGEON:  Surgeon(s) and Role:    * Daryll Brod, MD - Primary  PHYSICIAN ASSISTANT:   ASSISTANTS: none   ANESTHESIA:   local and regional  EBL:  No intake/output data recorded.  BLOOD ADMINISTERED:none  DRAINS: none   LOCAL MEDICATIONS USED:  BUPIVICAINE   SPECIMEN:  No Specimen  DISPOSITION OF SPECIMEN:  N/A  COUNTS:  YES  TOURNIQUET:   Total Tourniquet Time Documented: Upper Arm (laterality) - 24 minutes Total: Upper Arm (laterality) - 24 minutes   DICTATION: .Other Dictation: Dictation Number [id number]  PLAN OF CARE: Discharge to home after PACU  PATIENT DISPOSITION:  PACU - hemodynamically stable.

## 2016-10-16 ENCOUNTER — Encounter (HOSPITAL_BASED_OUTPATIENT_CLINIC_OR_DEPARTMENT_OTHER): Payer: Self-pay | Admitting: Orthopedic Surgery

## 2016-10-16 NOTE — Op Note (Signed)
Madison Marquez, Madison Marquez              ACCOUNT NO.:  [id number]  MEDICAL RECORD NO.:  [id number]  LOCATION:                                 FACILITY:  PHYSICIAN:  Daryll Brod, M.D.       DATE OF BIRTH:  03/02/1924  DATE OF PROCEDURE:  10/15/2016 DATE OF DISCHARGE:                              OPERATIVE REPORT   PREOPERATIVE DIAGNOSIS:  Carpal tunnel syndrome, left hand.  POSTOPERATIVE DIAGNOSIS:  Carpal tunnel syndrome, left hand.  OPERATION:  Decompression of left median nerve.  SURGEON:  Daryll Brod, MD.  ASSISTANT:  None.  ANESTHESIA:  Forearm-based IV regional with local infiltration.  PLACE OF SURGERY:  [facility].  ANESTHESIOLOGIST:  [place].  HISTORY:  The patient is a 83 year old female with a history of carpal tunnel syndrome.  EMG nerve conduction is positive, which has not responded to conservative treatment.  She has elected to undergo surgical decompression of left median nerve.  Pre, peri, and postoperative course were discussed along with risks and complications. She is aware that there is no guarantee to the surgery; the possibility of infection; recurrence of injury to arteries, nerves, tendons; incomplete relief of symptoms and dystrophy.  In the preoperative area, the patient is seen, the extremity marked by both patient and surgeon. Antibiotic given.  PROCEDURE IN DETAIL:  The patient was brought to the operating room, where a forearm-based IV regional anesthetic was carried out without difficulty under the direction of the Anesthesia Department.  She was prepped using ChloraPrep in a supine position with left arm free.  A 3- minute dry time was allowed, and a time-out taken, confirming the patient and procedure.  A longitudinal incision was made in the left palm, carried down through subcutaneous tissue.  Bleeders were electrocauterized.  The palmar fascia was split.  The superficial palmar arch was identified.  The flexor tendon to the ring  and little finger was identified.  These were retracted radially and the ulnar nerve ulnarly.  The flexor retinaculum was then incised with sharp dissection along its ulnar border.  A right angle and Sewell retractor were placed between skin and forearm fascia.  The tissue superior to the forearm fascia was then bluntly dissected free and the forearm fascia released for approximately 2-3 cm proximal to the wrist crease under direct vision.  Canal was explored.  Area of compression to the nerve was apparent.  Motor branch entered into muscle distally.  No further lesions were identified.  The wound was copiously irrigated with saline. The skin was closed with interrupted 4-0 nylon sutures.  A local infiltration with 0.25% bupivacaine without epinephrine was given.  Approximately 8 mL was used.  A sterile compressive dressing with the fingers free was applied.  On deflation of the tourniquet, all fingers immediately pinked.  She was taken to the recovery room for observation in satisfactory condition.  She will be discharged to home to return in [place] in 1 week, on [place].    ______________________________ Daryll Brod, M.D.   ______________________________ Daryll Brod, M.D.    GK/MEDQ  D:  10/15/2016  T:  10/15/2016  Job:  [id number]

## 2017-01-24 NOTE — Addendum Note (Signed)
Addendum  created 01/24/17 1031 by Effie Berkshire, MD   Sign clinical note

## 2017-02-27 DIAGNOSIS — H04123 Dry eye syndrome of bilateral lacrimal glands: Secondary | ICD-10-CM | POA: Diagnosis not present

## 2017-02-27 DIAGNOSIS — Z961 Presence of intraocular lens: Secondary | ICD-10-CM | POA: Diagnosis not present

## 2017-04-07 ENCOUNTER — Emergency Department (HOSPITAL_COMMUNITY): Payer: Medicare Other

## 2017-04-07 ENCOUNTER — Emergency Department (HOSPITAL_COMMUNITY)
Admission: EM | Admit: 2017-04-07 | Discharge: 2017-04-07 | Disposition: A | Discharge status: Home / Self Care | Payer: Medicare Other | Attending: Emergency Medicine | Admitting: Emergency Medicine

## 2017-04-07 ENCOUNTER — Encounter (HOSPITAL_COMMUNITY): Payer: Self-pay

## 2017-04-07 DIAGNOSIS — R1110 Vomiting, unspecified: Secondary | ICD-10-CM | POA: Diagnosis not present

## 2017-04-07 DIAGNOSIS — R05 Cough: Secondary | ICD-10-CM | POA: Diagnosis not present

## 2017-04-07 DIAGNOSIS — R079 Chest pain, unspecified: Secondary | ICD-10-CM | POA: Diagnosis not present

## 2017-04-07 DIAGNOSIS — Z7982 Long term (current) use of aspirin: Secondary | ICD-10-CM | POA: Diagnosis not present

## 2017-04-07 DIAGNOSIS — E039 Hypothyroidism, unspecified: Secondary | ICD-10-CM | POA: Diagnosis not present

## 2017-04-07 LAB — BASIC METABOLIC PANEL
ANION GAP: 7 (ref 5–15)
BUN: 14 mg/dL (ref 6–20)
CO2: 26 mmol/L (ref 22–32)
Calcium: 9 mg/dL (ref 8.9–10.3)
Chloride: 107 mmol/L (ref 101–111)
Creatinine, Ser: 0.87 mg/dL (ref 0.44–1.00)
GFR calc Af Amer: >60 mL/min (ref >60)
GFR, EST NON AFRICAN AMERICAN: 57 mL/min — AB (ref >60)
Glucose, Bld: 82 mg/dL (ref 65–99)
POTASSIUM: 4.3 mmol/L (ref 3.5–5.1)
SODIUM: 140 mmol/L (ref 135–145)

## 2017-04-07 LAB — CBC
HEMATOCRIT: 40.3 % (ref 36.0–46.0)
HEMOGLOBIN: 13 g/dL (ref 12.0–15.0)
MCH: 29 pg (ref 26.0–34.0)
MCHC: 32.3 g/dL (ref 30.0–36.0)
MCV: 89.8 fL (ref 78.0–100.0)
Platelets: 213 10*3/uL (ref 150–400)
RBC: 4.49 MIL/uL (ref 3.87–5.11)
RDW: 13.6 % (ref 11.5–15.5)
WBC: 6.5 10*3/uL (ref 4.0–10.5)

## 2017-04-07 LAB — LIPASE, BLOOD: Lipase: 39 U/L (ref 11–51)

## 2017-04-07 LAB — HEPATIC FUNCTION PANEL
ALK PHOS: 56 U/L (ref 38–126)
ALT: 12 U/L — AB (ref 14–54)
AST: 22 U/L (ref 15–41)
Albumin: 3.5 g/dL (ref 3.5–5.0)
Bilirubin, Direct: 0.2 mg/dL (ref 0.1–0.5)
Indirect Bilirubin: 0.5 mg/dL (ref 0.3–0.9)
TOTAL PROTEIN: 6.1 g/dL — AB (ref 6.5–8.1)
Total Bilirubin: 0.7 mg/dL (ref 0.3–1.2)

## 2017-04-07 LAB — I-STAT TROPONIN, ED
Troponin i, poc: 0.03 ng/mL (ref 0.00–0.08)
Troponin i, poc: 0.04 ng/mL (ref 0.00–0.08)

## 2017-04-07 NOTE — ED Provider Notes (Signed)
[facility] DEPT Provider Note   CSN: [id number] Arrival date & time: 04/07/17  1252     History   Chief Complaint Chief Complaint  Patient presents with  . Chest Pain  . Emesis    HPI Madison Marquez is a 83 y.o. female.  HPI Pt states she did not sleep well last night.    Early this morning/night she had some mild chest discomfort.  Somewhere between 6-7 am today she had an episode of vomiting.  She is not sure why that happened.  She was not feeling nauseated.  No abdominal.  No diarrhea.  Pt read that those symptoms could be related to her heart so she came in for evaluation.   She still has some mild discomfort in her chest intermittently.  It comes and goes.  No shoulder, jaw or back pain.  No shortness of breath.  No history of heart disease.  Pt does have a history of acid reflux in the past. Past Medical History:  Diagnosis Date  . Hypercholesterolemia   . Hypothyroid     There are no active problems to display for this patient.   Past Surgical History:  Procedure Laterality Date  . ABDOMINAL HYSTERECTOMY    . CARPAL TUNNEL RELEASE Left 10/15/2016   Procedure: LEFT CARPAL TUNNEL RELEASE;  Surgeon: Daryll Brod, MD;  [facility];  Service: Orthopedics;  Laterality: Left;  . HERNIA REPAIR      OB History    Gravida Para Term Preterm AB Living   2 2           SAB TAB Ectopic Multiple Live Births                   Home Medications    Prior to Admission medications   Medication Sig Start Date End Date Taking? Authorizing Provider  aspirin 325 MG tablet Take 325 mg by mouth at bedtime.    Yes [provider]  B Complex-C (B-COMPLEX WITH VITAMIN C) tablet Take 1 tablet by mouth daily.   Yes [provider]  cholecalciferol (VITAMIN D) 1000 UNITS tablet Take 1,000 Units by mouth daily.   Yes [provider]  levothyroxine (SYNTHROID, LEVOTHROID) 75 MCG tablet Take 75 mcg by mouth daily before breakfast.     Yes [provider]  vitamin B-12 (CYANOCOBALAMIN) 1000 MCG tablet Take 1,000 mcg by mouth daily.   Yes [provider]  HYDROcodone-acetaminophen (NORCO) 5-325 MG tablet Take 1 tablet by mouth every 6 (six) hours as needed for moderate pain. Patient not taking: Reported on 04/07/2017 10/15/16   Daryll Brod, MD    Family History No family history on file.  Social History Social History  Substance Use Topics  . Smoking status: Never Smoker  . Smokeless tobacco: Never Used  . Alcohol use No     Allergies   Patient has no known allergies.   Review of Systems Review of Systems  All other systems reviewed and are negative.    Physical Exam Updated Vital Signs BP (!) 166/62   Pulse 74   Temp 97.9 F (36.6 C) (Oral)   Resp (!) 23   Ht 1.626 m (5\' 4" )   Wt 74.8 kg (165 lb)   SpO2 99%   BMI 28.32 kg/m   Physical Exam   ED Treatments / Results  Labs (all labs ordered are listed, but only abnormal results are displayed) Labs Reviewed  BASIC METABOLIC PANEL - Abnormal; Notable for  the following:       Result Value   GFR calc non Af Amer 57 (*)    All other components within normal limits  HEPATIC FUNCTION PANEL - Abnormal; Notable for the following:    Total Protein 6.1 (*)    ALT 12 (*)    All other components within normal limits  CBC  LIPASE, BLOOD  I-STAT TROPONIN, ED    EKG  EKG Interpretation  Date/Time:  Monday April 07 2017 13:05:10 EDT Ventricular Rate:  75 PR Interval:    QRS Duration: 105 QT Interval:  440 QTC Calculation: 492 R Axis:   -58 Text Interpretation:  Sinus rhythm Multiform ventricular premature complexes LAD, consider left anterior fascicular block Low voltage, extremity leads Nonspecific repol abnormality, lateral leads Minimal ST elevation, anterior leads No significant change since last tracing Confirmed by Dorie Rank [phone]) on 04/07/2017 1:11:16 PM       Radiology Dg Chest 2 View  Result Date:  04/07/2017 CLINICAL DATA:  Chest pain.  Coughing.  Nausea and vomiting. EXAM: CHEST  2 VIEW COMPARISON:  12/26/2015 FINDINGS: Heart size and pulmonary vascularity are normal. Slight tortuosity and calcification of the thoracic aorta. The lungs are clear. No effusions. No acute bone abnormality. Moderate arthritic changes of the glenohumeral joints. IMPRESSION: No acute abnormalities. Aortic atherosclerosis. Electronically Signed   By: Lorriane Shire M.D.   On: 04/07/2017 14:05    Procedures Procedures (including critical care time)  Medications Ordered in ED Medications - No data to display   Initial Impression / Assessment and Plan / ED Course  I have reviewed the triage vital signs and the nursing notes.  Pertinent labs & imaging results that were available during my care of the patient were reviewed by me and considered in my medical decision making (see chart for details).  Clinical Course as of Apr 08 1631  Mon Apr 07, 2017  1632 Dr Regenia Skeeter will follow up on 2nd troponin  [JK]    Clinical Course User Index [JK] Dorie Rank, MD  PT has no known history of heart disease.   Low risk by heart score (3).  Sx are atypical for ACS.  It is possible her symptoms are related to GERD.  Discussed doing a second troponin in the ED.  If normal, I think it is reasonable for her to follow up closely with her PCP.  Pt and family member agree.    Final Clinical Impressions(s) / ED Diagnoses   Final diagnoses:  Chest pain, unspecified type    New Prescriptions New Prescriptions   No medications on file     Dorie Rank, MD 04/07/17 [phone]

## 2017-04-07 NOTE — ED Provider Notes (Signed)
Patient remains chest pain-free. Her initial troponin was actually drawn closer to 1 PM and now her second troponin is stable and negative. Original symptoms were at 6 AM so she is now 10 hours out. No indication of MI. Discussed that there still could be some on diagnosed heart disease and she needs to follow up with PCP. She agrees with this and agrees with return precautions.   Sherwood Gambler, MD 04/07/17 [phone]

## 2017-04-07 NOTE — ED Notes (Signed)
Pt states she understands insrtuctiobn. Home stable via wheel chair with son.

## 2017-04-07 NOTE — ED Triage Notes (Signed)
Pt arrives EMS from home where she c/o minimal  Dull chest pain at left chest  starting in early hours of morning and vomiting once about 6 am. Pt alert and oriented x 4, MAEW resp even and non labored.

## 2017-04-07 NOTE — ED Notes (Signed)
Pt given meal and water.

## 2017-05-15 DIAGNOSIS — Z23 Encounter for immunization: Secondary | ICD-10-CM | POA: Diagnosis not present

## 2017-08-05 DIAGNOSIS — M25532 Pain in left wrist: Secondary | ICD-10-CM | POA: Diagnosis not present

## 2017-08-12 DIAGNOSIS — S52502A Unspecified fracture of the lower end of left radius, initial encounter for closed fracture: Secondary | ICD-10-CM | POA: Diagnosis not present

## 2017-08-18 DIAGNOSIS — I700 Atherosclerosis of aorta: Secondary | ICD-10-CM | POA: Diagnosis not present

## 2017-08-18 DIAGNOSIS — Z7189 Other specified counseling: Secondary | ICD-10-CM | POA: Diagnosis not present

## 2017-08-18 DIAGNOSIS — R0601 Orthopnea: Secondary | ICD-10-CM | POA: Diagnosis not present

## 2017-08-18 DIAGNOSIS — Z79899 Other long term (current) drug therapy: Secondary | ICD-10-CM | POA: Diagnosis not present

## 2017-08-18 DIAGNOSIS — E039 Hypothyroidism, unspecified: Secondary | ICD-10-CM | POA: Diagnosis not present

## 2017-08-25 ENCOUNTER — Inpatient Hospital Stay (HOSPITAL_COMMUNITY)
Admission: EM | Admit: 2017-08-25 | Discharge: 2017-08-30 | DRG: 286 | Disposition: A | Discharge status: Home Health | Payer: Medicare Other | Attending: Internal Medicine | Admitting: Internal Medicine

## 2017-08-25 ENCOUNTER — Other Ambulatory Visit: Payer: Self-pay

## 2017-08-25 ENCOUNTER — Encounter (HOSPITAL_COMMUNITY): Payer: Self-pay | Admitting: Emergency Medicine

## 2017-08-25 ENCOUNTER — Emergency Department (HOSPITAL_COMMUNITY): Payer: Medicare Other

## 2017-08-25 DIAGNOSIS — R069 Unspecified abnormalities of breathing: Secondary | ICD-10-CM | POA: Diagnosis not present

## 2017-08-25 DIAGNOSIS — Z7982 Long term (current) use of aspirin: Secondary | ICD-10-CM | POA: Diagnosis not present

## 2017-08-25 DIAGNOSIS — N183 Chronic kidney disease, stage 3 (moderate): Secondary | ICD-10-CM | POA: Diagnosis not present

## 2017-08-25 DIAGNOSIS — I5041 Acute combined systolic (congestive) and diastolic (congestive) heart failure: Secondary | ICD-10-CM | POA: Diagnosis not present

## 2017-08-25 DIAGNOSIS — R0600 Dyspnea, unspecified: Secondary | ICD-10-CM | POA: Diagnosis not present

## 2017-08-25 DIAGNOSIS — I493 Ventricular premature depolarization: Secondary | ICD-10-CM | POA: Diagnosis not present

## 2017-08-25 DIAGNOSIS — Z79890 Hormone replacement therapy (postmenopausal): Secondary | ICD-10-CM

## 2017-08-25 DIAGNOSIS — Z90710 Acquired absence of both cervix and uterus: Secondary | ICD-10-CM

## 2017-08-25 DIAGNOSIS — R072 Precordial pain: Secondary | ICD-10-CM | POA: Diagnosis not present

## 2017-08-25 DIAGNOSIS — R0609 Other forms of dyspnea: Secondary | ICD-10-CM | POA: Diagnosis not present

## 2017-08-25 DIAGNOSIS — E785 Hyperlipidemia, unspecified: Secondary | ICD-10-CM | POA: Diagnosis present

## 2017-08-25 DIAGNOSIS — E876 Hypokalemia: Secondary | ICD-10-CM | POA: Diagnosis not present

## 2017-08-25 DIAGNOSIS — E039 Hypothyroidism, unspecified: Secondary | ICD-10-CM | POA: Diagnosis present

## 2017-08-25 DIAGNOSIS — I429 Cardiomyopathy, unspecified: Secondary | ICD-10-CM | POA: Diagnosis not present

## 2017-08-25 DIAGNOSIS — R0789 Other chest pain: Secondary | ICD-10-CM | POA: Diagnosis present

## 2017-08-25 DIAGNOSIS — Z66 Do not resuscitate: Secondary | ICD-10-CM | POA: Diagnosis not present

## 2017-08-25 DIAGNOSIS — I5181 Takotsubo syndrome: Secondary | ICD-10-CM | POA: Diagnosis not present

## 2017-08-25 DIAGNOSIS — E7800 Pure hypercholesterolemia, unspecified: Secondary | ICD-10-CM | POA: Diagnosis not present

## 2017-08-25 DIAGNOSIS — I361 Nonrheumatic tricuspid (valve) insufficiency: Secondary | ICD-10-CM | POA: Diagnosis not present

## 2017-08-25 DIAGNOSIS — I509 Heart failure, unspecified: Secondary | ICD-10-CM | POA: Diagnosis not present

## 2017-08-25 DIAGNOSIS — I10 Essential (primary) hypertension: Secondary | ICD-10-CM | POA: Diagnosis not present

## 2017-08-25 DIAGNOSIS — I130 Hypertensive heart and chronic kidney disease with heart failure and stage 1 through stage 4 chronic kidney disease, or unspecified chronic kidney disease: Principal | ICD-10-CM | POA: Diagnosis present

## 2017-08-25 DIAGNOSIS — J90 Pleural effusion, not elsewhere classified: Secondary | ICD-10-CM | POA: Diagnosis not present

## 2017-08-25 DIAGNOSIS — I5043 Acute on chronic combined systolic (congestive) and diastolic (congestive) heart failure: Secondary | ICD-10-CM | POA: Diagnosis not present

## 2017-08-25 HISTORY — DX: Heart failure, unspecified: I50.9

## 2017-08-25 HISTORY — DX: Migraine, unspecified, not intractable, without status migrainosus: G43.909

## 2017-08-25 HISTORY — DX: Unspecified osteoarthritis, unspecified site: M19.90

## 2017-08-25 HISTORY — DX: Infectious mononucleosis, unspecified without complication: B27.90

## 2017-08-25 LAB — TSH: TSH: 3.076 u[IU]/mL (ref 0.350–4.500)

## 2017-08-25 LAB — MAGNESIUM
MAGNESIUM: 2 mg/dL (ref 1.7–2.4)
MAGNESIUM: 2 mg/dL (ref 1.7–2.4)

## 2017-08-25 LAB — BASIC METABOLIC PANEL
ANION GAP: 7 (ref 5–15)
BUN: 14 mg/dL (ref 6–20)
CALCIUM: 8.9 mg/dL (ref 8.9–10.3)
CO2: 24 mmol/L (ref 22–32)
Chloride: 110 mmol/L (ref 101–111)
Creatinine, Ser: 0.89 mg/dL (ref 0.44–1.00)
GFR calc Af Amer: >60 mL/min (ref >60)
GFR, EST NON AFRICAN AMERICAN: 55 mL/min — AB (ref >60)
GLUCOSE: 88 mg/dL (ref 65–99)
POTASSIUM: 4 mmol/L (ref 3.5–5.1)
SODIUM: 141 mmol/L (ref 135–145)

## 2017-08-25 LAB — LIPID PANEL
CHOL/HDL RATIO: 2.8 ratio
CHOLESTEROL: 169 mg/dL (ref 0–200)
HDL: 61 mg/dL (ref >40)
LDL Cholesterol: 98 mg/dL (ref 0–99)
Triglycerides: 51 mg/dL (ref <150)
VLDL: 10 mg/dL (ref 0–40)

## 2017-08-25 LAB — TROPONIN I
TROPONIN I: 0.04 ng/mL — AB (ref <0.03)
TROPONIN I: 0.05 ng/mL — AB (ref <0.03)

## 2017-08-25 LAB — CBC
HEMATOCRIT: 41.2 % (ref 36.0–46.0)
HEMOGLOBIN: 13.5 g/dL (ref 12.0–15.0)
MCH: 30.3 pg (ref 26.0–34.0)
MCHC: 32.8 g/dL (ref 30.0–36.0)
MCV: 92.4 fL (ref 78.0–100.0)
Platelets: 237 10*3/uL (ref 150–400)
RBC: 4.46 MIL/uL (ref 3.87–5.11)
RDW: 14.2 % (ref 11.5–15.5)
WBC: 6 10*3/uL (ref 4.0–10.5)

## 2017-08-25 LAB — I-STAT TROPONIN, ED: Troponin i, poc: 0.05 ng/mL (ref 0.00–0.08)

## 2017-08-25 LAB — BRAIN NATRIURETIC PEPTIDE: B NATRIURETIC PEPTIDE 5: 257.1 pg/mL — AB (ref 0.0–100.0)

## 2017-08-25 LAB — MRSA PCR SCREENING: MRSA BY PCR: NEGATIVE

## 2017-08-25 MED ORDER — ONDANSETRON HCL 4 MG/2ML IJ SOLN: 4.0000 mg | Freq: Four times a day (QID) | INTRAMUSCULAR | Status: DC | PRN

## 2017-08-25 MED ORDER — VITAMIN D 1000 UNITS PO TABS
1000.0000 [IU] | ORAL_TABLET | Freq: Every day | ORAL | Status: DC
  Administered 2017-08-27 – 2017-08-30 (×4): 1000 [IU] via ORAL
  Filled 2017-08-25 (×4): qty 1

## 2017-08-25 MED ORDER — ASPIRIN 325 MG PO TABS
325.0000 mg | ORAL_TABLET | Freq: Every day | ORAL | Status: DC
  Administered 2017-08-25 – 2017-08-26 (×2): 325 mg via ORAL
  Filled 2017-08-25 (×2): qty 1

## 2017-08-25 MED ORDER — VITAMIN B-12 1000 MCG PO TABS
1000.0000 ug | ORAL_TABLET | Freq: Every day | ORAL | Status: DC
  Administered 2017-08-27 – 2017-08-30 (×4): 1000 ug via ORAL
  Filled 2017-08-25 (×4): qty 1

## 2017-08-25 MED ORDER — FUROSEMIDE 10 MG/ML IJ SOLN
40.0000 mg | Freq: Once | INTRAMUSCULAR | Status: AC
  Administered 2017-08-25: 40 mg via INTRAVENOUS
  Filled 2017-08-25: qty 4

## 2017-08-25 MED ORDER — FUROSEMIDE 10 MG/ML IJ SOLN
40.0000 mg | Freq: Two times a day (BID) | INTRAMUSCULAR | Status: AC
  Administered 2017-08-25 – 2017-08-26 (×2): 40 mg via INTRAVENOUS
  Filled 2017-08-25 (×3): qty 4

## 2017-08-25 MED ORDER — SODIUM CHLORIDE 0.9 % IV SOLN
250.0000 mL | INTRAVENOUS | Status: DC | PRN
  Administered 2017-08-29: 250 mL via INTRAVENOUS

## 2017-08-25 MED ORDER — B COMPLEX-C PO TABS
1.0000 | ORAL_TABLET | Freq: Every day | ORAL | Status: DC
  Administered 2017-08-27 – 2017-08-30 (×4): 1 via ORAL
  Filled 2017-08-25 (×6): qty 1

## 2017-08-25 MED ORDER — SODIUM CHLORIDE 0.9% FLUSH
3.0000 mL | Freq: Two times a day (BID) | INTRAVENOUS | Status: DC
  Administered 2017-08-25 – 2017-08-30 (×10): 3 mL via INTRAVENOUS

## 2017-08-25 MED ORDER — SODIUM CHLORIDE 0.9% FLUSH: 3.0000 mL | INTRAVENOUS | Status: DC | PRN

## 2017-08-25 MED ORDER — ACETAMINOPHEN 325 MG PO TABS
650.0000 mg | ORAL_TABLET | ORAL | Status: DC | PRN
  Administered 2017-08-28 – 2017-08-30 (×3): 650 mg via ORAL
  Filled 2017-08-25 (×3): qty 2

## 2017-08-25 MED ORDER — ENOXAPARIN SODIUM 40 MG/0.4ML ~~LOC~~ SOLN
40.0000 mg | SUBCUTANEOUS | Status: DC
  Administered 2017-08-27 – 2017-08-30 (×3): 40 mg via SUBCUTANEOUS
  Filled 2017-08-25 (×5): qty 0.4

## 2017-08-25 MED ORDER — LEVOTHYROXINE SODIUM 75 MCG PO TABS
75.0000 ug | ORAL_TABLET | Freq: Every day | ORAL | Status: DC
  Administered 2017-08-27 – 2017-08-30 (×4): 75 ug via ORAL
  Filled 2017-08-25 (×4): qty 1

## 2017-08-25 NOTE — ED Provider Notes (Signed)
Emergency Department Provider Note   I have reviewed the triage vital signs and the nursing notes.   HISTORY  Chief Complaint Shortness of Breath   HPI Madison Marquez is a 83 y.o. female history of hypothyroidism hyperlipidemia presents to the emergency department today secondary to worsening shortness of breath.  Patient states she has had a couple months of dyspnea especially on exertion is progressively worsened.  She is a 45 pounds of weight gain since Thanksgiving but has blamed this on her eating habits.  She has real significant shortness of breath when laying flat but does seem to improve some when she sits up.  This morning when she woke up it was much worse than what had been recently so she came here for evaluation.  Some cough when lying flat but no fevers or coughing otherwise.  No lower extremity edema.  EMS was called in with EMS she had normal heart rate, oxygenation and respiratory rate but was hypertensive.  Her ECG with them showed LVH with repolarization abnormalities. Has not had symptoms like this before. No other associated or modifying symptoms.    Past Medical History:  Diagnosis Date  . Hypercholesterolemia   . Hypothyroid     Patient Active Problem List   Diagnosis Date Noted  . Acute congestive heart failure ([place]) 08/25/2017  . Hypercholesterolemia 08/25/2017  . Hypothyroid 08/25/2017  . Chest pressure 08/25/2017    Past Surgical History:  Procedure Laterality Date  . ABDOMINAL HYSTERECTOMY    . CARPAL TUNNEL RELEASE Left 10/15/2016   Procedure: LEFT CARPAL TUNNEL RELEASE;  Surgeon: Daryll Brod, MD;  Location: [facility];  Service: Orthopedics;  Laterality: Left;  . HERNIA REPAIR        Allergies Patient has no known allergies.  No family history on file.  Social History Social History   Tobacco Use  . Smoking status: Never Smoker  . Smokeless tobacco: Never Used  Substance Use Topics  . Alcohol use: No  . Drug use:  No    Review of Systems  All other systems negative except as documented in the HPI. All pertinent positives and negatives as reviewed in the HPI. ____________________________________________   PHYSICAL EXAM:  VITAL SIGNS: ED Triage Vitals [08/25/17 0724]  Enc Vitals Group     BP (!) 167/80     Pulse Rate 90     Resp 16     Temp 97.9 F (36.6 C)     Temp Source Oral     SpO2 100 %     Weight      Height      Head Circumference      Peak Flow      Pain Score      Pain Loc      Pain Edu?      Excl. in [place]?     Constitutional: Alert and oriented. Well appearing and in no acute distress. Eyes: Conjunctivae are normal. PERRL. EOMI. Head: Atraumatic. Nose: No congestion/rhinnorhea. Mouth/Throat: Mucous membranes are moist.  Oropharynx non-erythematous. Neck: No stridor.  No meningeal signs.  JVD present Cardiovascular: Normal rate, regular rhythm. Good peripheral circulation. Grossly normal heart sounds.   Respiratory: mild tachypneic respiratory effort.  No retractions. Lungs with basilar crackles. Gastrointestinal: Soft and nontender. No distention.  Musculoskeletal: No lower extremity tenderness nor edema. No gross deformities of extremities. Neurologic:  Normal speech and language. No gross focal neurologic deficits are appreciated.  Skin:  Skin is warm, dry and intact.  No rash noted.   ____________________________________________   LABS (all labs ordered are listed, but only abnormal results are displayed)  Labs Reviewed  BASIC METABOLIC PANEL - Abnormal; Notable for the following components:      Result Value   GFR calc non Af Amer 55 (*)    All other components within normal limits  BRAIN NATRIURETIC PEPTIDE - Abnormal; Notable for the following components:   B Natriuretic Peptide 257.1 (*)    All other components within normal limits  TROPONIN I - Abnormal; Notable for the following components:   Troponin I 0.04 (*)    All other components within normal  limits  MRSA PCR SCREENING  MAGNESIUM  CBC  TSH  MAGNESIUM  LIPID PANEL  TROPONIN I  TROPONIN I  I-STAT TROPONIN, ED   ____________________________________________  EKG   EKG Interpretation  Date/Time:    Ventricular Rate:    PR Interval:    QRS Duration:   QT Interval:    QTC Calculation:   R Axis:     Text Interpretation:         ____________________________________________  RADIOLOGY  Dg Chest 2 View  Result Date: 08/25/2017 CLINICAL DATA:  Dyspnea on exertion EXAM: CHEST  2 VIEW COMPARISON:  04/07/17 FINDINGS: Cardiac shadow is within normal limits. Aortic calcifications are seen. The lungs are well aerated bilaterally. Diffuse mild interstitial changes are noted. Some mild basilar atelectasis is seen. Small pleural effusions are noted as well. No bony abnormality is seen. IMPRESSION: Small effusions and mild atelectatic changes Electronically Signed   By: Inez Catalina M.D.   On: 08/25/2017 09:43    ____________________________________________   PROCEDURES  Procedure(s) performed:   Procedures   ____________________________________________   INITIAL IMPRESSION / ASSESSMENT AND PLAN / ED COURSE  Suspect new onset HF as the cause for symptoms. Oxygenating well and in no distress currently, so will start workup and hold on treatment for this time.   Likely HF. Now patient endorsing CP. Could be concerning for NSTEMI or other UA. Will admit for further workup of same.      Pertinent labs & imaging results that were available during my care of the patient were reviewed by me and considered in my medical decision making (see chart for details).  ____________________________________________  FINAL CLINICAL IMPRESSION(S) / ED DIAGNOSES  Final diagnoses:  Dyspnea, unspecified type  Heart failure, unspecified HF chronicity, unspecified heart failure type ([place])     MEDICATIONS GIVEN DURING THIS VISIT:  Medications  furosemide (LASIX) injection 40 mg  (not administered)  aspirin tablet 325 mg (not administered)  B-complex with vitamin C tablet 1 tablet (not administered)  cholecalciferol (VITAMIN D) tablet 1,000 Units (not administered)  levothyroxine (SYNTHROID, LEVOTHROID) tablet 75 mcg (not administered)  vitamin B-12 (CYANOCOBALAMIN) tablet 1,000 mcg (not administered)  sodium chloride flush (NS) 0.9 % injection 3 mL (not administered)  sodium chloride flush (NS) 0.9 % injection 3 mL (not administered)  0.9 %  sodium chloride infusion (not administered)  acetaminophen (TYLENOL) tablet 650 mg (not administered)  ondansetron (ZOFRAN) injection 4 mg (not administered)  enoxaparin (LOVENOX) injection 40 mg (not administered)  furosemide (LASIX) injection 40 mg (40 mg Intravenous Given 08/25/17 1126)     NEW OUTPATIENT MEDICATIONS STARTED DURING THIS VISIT:  This SmartLink is deprecated. Use AVSMEDLIST instead to display the medication list for a patient.  Note:  This note was prepared with assistance of Dragon voice recognition software. Occasional wrong-word or sound-a-like substitutions may have occurred due  to the inherent limitations of voice recognition software.   Merrily Pew, MD 08/25/17 [phone]

## 2017-08-25 NOTE — ED Triage Notes (Signed)
Patient from [facility] with [facility] for complaint of shortness of breath that gets worse with exertion for the last several weeks. Denies chest pain, nausea, or any other complaints. Left forearm in cast after recent fracture. Alert and oriented at this time. 20g saline lock in right AC.

## 2017-08-25 NOTE — H&P (Signed)
History and Physical    Madison Marquez YHC:[id number] DOB: [birth year] DOA: 08/25/2017   PCP: Lajean Manes, MD   Attending physician: Denton Brick  Patient coming from/Resides with: [facility]  Chief Complaint: DOE and PND  HPI: Madison Marquez is a 83 y.o. female with medical history significant for remote hypertension but off meds for at least 10 years, dyslipidemia, hypothyroidism.  Patient presented to the ER with worsening of progressive dyspnea on exertion and PND.  She reports 2-3 months of noticeable dyspnea on exertion and "wheezy" episodes at night.  She initially thought she had some allergy to an environmental stimulus in her room.  In the ER she was not hypoxemic.  X-ray revealed small pleural effusions and mild atelectatic changes.  BNP was slightly elevated at 257.  Blood pressure was noted to be borderline elevated.  Past 1 week she has had noticeable chest pressure when attempting to sleep and with ambulation.  Patient being admitted with a presumptive diagnosis of new onset congestive heart failure unknown subtype and has been given 1 dose of IV Lasix in the ER.  Her weight is up 5 pounds since last obtained in February 2018.  ED Course:  Vital Signs: BP (!) 145/68   Pulse 87   Temp 97.9 F (36.6 C) (Oral)   Resp (!) 22   Ht 5\' 4"  (1.626 m)   Wt 74.8 kg (165 lb)   SpO2 100%   BMI 28.32 kg/m  Chest x-ray: As above Lab data: Sodium 141, potassium 4.0, chloride 110, CO2 24, glucose 88, BUN 14, creatinine 0.89, BNP 257, poc troponin 0 0.05, white count 6000 differential not obtained, hemoglobin 13.5, platelets 237,000  Medications and treatments: Lasix 40 mg IV x1  Review of Systems:  In addition to the HPI above,  No Fever-chills, myalgias or other constitutional symptoms No Headache, changes with Vision or hearing, new weakness, tingling, numbness in any extremity, dizziness, dysarthria or word finding difficulty, gait disturbance or imbalance, tremors or seizure  activity No problems swallowing food or Liquids, indigestion/reflux, choking or coughing while eating, abdominal pain with or after eating No Cough, palpitations No Abdominal pain, N/V, melena,hematochezia, dark tarry stools, constipation No dysuria, malodorous urine, hematuria or flank pain No new skin rashes, lesions, masses or bruises, No new joint pains, aches, swelling or redness No recent unintentional weight gain or loss No polyuria, polydypsia or polyphagia   Past Medical History:  Diagnosis Date  . Hypercholesterolemia   . Hypothyroid     Past Surgical History:  Procedure Laterality Date  . ABDOMINAL HYSTERECTOMY    . CARPAL TUNNEL RELEASE Left 10/15/2016   Procedure: LEFT CARPAL TUNNEL RELEASE;  Surgeon: Daryll Brod, MD;  Location: [facility];  Service: Orthopedics;  Laterality: Left;  . HERNIA REPAIR      Social History   Socioeconomic History  . Marital status: Widowed    Spouse name: Not on file  . Number of children: Not on file  . Years of education: Not on file  . Highest education level: Not on file  Social Needs  . Financial resource strain: Not on file  . Food insecurity - worry: Not on file  . Food insecurity - inability: Not on file  . Transportation needs - medical: Not on file  . Transportation needs - non-medical: Not on file  Occupational History  . Not on file  Tobacco Use  . Smoking status: Never Smoker  . Smokeless tobacco: Never Used  Substance and Sexual Activity  .  Alcohol use: No  . Drug use: No  . Sexual activity: Not on file  Other Topics Concern  . Not on file  Social History Narrative  . Not on file    Mobility: Independent Work history: Not obtained   No Known Allergies  Family history reviewed and not pertinent current admission findings and diagnosis (negative history)  Prior to Admission medications   Medication Sig Start Date End Date Taking? Authorizing Provider  aspirin 325 MG tablet Take 325 mg by  mouth at bedtime.    Yes [provider]  B Complex-C (B-COMPLEX WITH VITAMIN C) tablet Take 1 tablet by mouth daily.   Yes [provider]  cholecalciferol (VITAMIN D) 1000 UNITS tablet Take 1,000 Units by mouth daily.   Yes [provider]  levothyroxine (SYNTHROID, LEVOTHROID) 75 MCG tablet Take 75 mcg by mouth daily before breakfast.    Yes [provider]  vitamin B-12 (CYANOCOBALAMIN) 1000 MCG tablet Take 1,000 mcg by mouth daily.   Yes [provider]  HYDROcodone-acetaminophen (NORCO) 5-325 MG tablet Take 1 tablet by mouth every 6 (six) hours as needed for moderate pain. Patient not taking: Reported on 04/07/2017 10/15/16   Daryll Brod, MD    Physical Exam: Vitals:   08/25/17 1015 08/25/17 1045 08/25/17 1130 08/25/17 1200  BP: (!) 141/72 (!) 113/96 (!) 145/68   Pulse: 71 75 77 87  Resp: 13 19  (!) 22  Temp:      TempSrc:      SpO2: 100% 99% 99% 100%  Weight:      Height:          Constitutional: NAD, calm, comfortable Eyes: PERRL, lids and conjunctivae normal ENMT: Mucous membranes are moist. Posterior pharynx clear of any exudate or lesions. Normal dentition.  Neck: normal, supple, no masses, no thyromegaly Respiratory: clear to auscultation bilaterally, no wheezing, no crackles- diminished in the bases left greater than right. Normal respiratory effort. No accessory muscle use.  Cardiovascular: Regular rate and rhythm, no murmurs / rubs / gallops. No extremity edema. 2+ pedal pulses. No carotid bruits.  Abdomen: no tenderness, no masses palpated. No hepatosplenomegaly. Bowel sounds positive.  Musculoskeletal: no clubbing / cyanosis. No joint deformity upper and lower extremities. Good ROM, no contractures. Normal muscle tone.  Soft wrist cast in place left upper extremity. Skin: no rashes, lesions, ulcers. No induration Neurologic: CN 2-12 grossly intact. Sensation intact, DTR normal. Strength 5/5 x all 4 extremities.    Psychiatric: Normal judgment and insight. Alert and oriented x 3. Normal mood.    Labs on Admission: I have personally reviewed following labs and imaging studies  CBC: Recent Labs  Lab 08/25/17 0810  WBC 6.0  HGB 13.5  HCT 41.2  MCV 92.4  PLT 737   Basic Metabolic Panel: Recent Labs  Lab 08/25/17 0810  NA 141  K 4.0  CL 110  CO2 24  GLUCOSE 88  BUN 14  CREATININE 0.89  CALCIUM 8.9  MG 2.0   GFR: Estimated Creatinine Clearance: 40.8 mL/min (by C-G formula based on SCr of 0.89 mg/dL). Liver Function Tests: No results for input(s): AST, ALT, ALKPHOS, BILITOT, PROT, ALBUMIN in the last 168 hours. No results for input(s): LIPASE, AMYLASE in the last 168 hours. No results for input(s): AMMONIA in the last 168 hours. Coagulation Profile: No results for input(s): INR, PROTIME in the last 168 hours. Cardiac Enzymes: No results for input(s): CKTOTAL, CKMB, CKMBINDEX, TROPONINI in the last 168 hours. BNP (last  3 results) No results for input(s): PROBNP in the last 8760 hours. HbA1C: No results for input(s): HGBA1C in the last 72 hours. CBG: No results for input(s): GLUCAP in the last 168 hours. Lipid Profile: No results for input(s): CHOL, HDL, LDLCALC, TRIG, CHOLHDL, LDLDIRECT in the last 72 hours. Thyroid Function Tests: No results for input(s): TSH, T4TOTAL, FREET4, T3FREE, THYROIDAB in the last 72 hours. Anemia Panel: No results for input(s): VITAMINB12, FOLATE, FERRITIN, TIBC, IRON, RETICCTPCT in the last 72 hours. Urine analysis:    Component Value Date/Time   COLORURINE YELLOW 07/31/2014 [place] 07/31/2014 0949   LABSPEC 1.010 07/31/2014 0949   PHURINE 7.5 07/31/2014 0949   GLUCOSEU NEGATIVE 07/31/2014 0949   HGBUR NEGATIVE 07/31/2014 0949   BILIRUBINUR NEGATIVE 07/31/2014 0949   KETONESUR NEGATIVE 07/31/2014 0949   PROTEINUR NEGATIVE 07/31/2014 0949   UROBILINOGEN 0.2 07/31/2014 0949   NITRITE NEGATIVE 07/31/2014 0949   LEUKOCYTESUR  SMALL (A) 07/31/2014 0949   Sepsis Labs: @LABRCNTIP (procalcitonin:4,lacticidven:4) )No results found for this or any previous visit (from the past 240 hour(s)).   Radiological Exams on Admission: Dg Chest 2 View  Result Date: 08/25/2017 CLINICAL DATA:  Dyspnea on exertion EXAM: CHEST  2 VIEW COMPARISON:  04/07/17 FINDINGS: Cardiac shadow is within normal limits. Aortic calcifications are seen. The lungs are well aerated bilaterally. Diffuse mild interstitial changes are noted. Some mild basilar atelectasis is seen. Small pleural effusions are noted as well. No bony abnormality is seen. IMPRESSION: Small effusions and mild atelectatic changes Electronically Signed   By: Inez Catalina M.D.   On: 08/25/2017 09:43    EKG: (Independently reviewed) sinus rhythm with ventricular rate 80 bpm, QTC 501 ms in the context of nonspecific IVCD-??  Left bundle branch block-no essential change since previous EKG and no definitive ischemic changes  Assessment/Plan Principal Problem:   Acute congestive heart failure  -Presents with progressive DOE and PND over 2-3 months without resting hypoxemia with physical exam, x-ray and laboratory data all consistent with likely heart failure exacerbation-suspect underlying diastolic dysfunction -Prior history of hypertension but has not been on medications for over 10 years -Echocardiogram -No ACE inhibitor or beta-blocker since current blood pressure borderline hypertensive and unknown heart failure subtype-drugs can be initiated after echocardiogram resulted -Daily weights, strict I/O -Lasix 40 mg IV every 12 hours x2 more doses -Follow labs -Patient is not hypoxemic but reports subjective improvement in breathing with application of nasal cannula oxygen which will be continued  Active Problems:   Chest pressure -Occurred in the context of dyspnea on exertion and PND -Initial troponin and EKG reassuring -Cycle troponin and follow-up on echo     Hypercholesterolemia -Not on statin or other medical therapy prior to admission -FLP    Hypothyroid -Continue Synthroid -TSH      DVT prophylaxis: Lovenox Code Status: DNR Family Communication: Family at bedside Disposition Plan: Return to independent living once medically stable Consults called: None    Cassady Stanczak L. ANP-BC [facility] Pager [phone]   If 7PM-7AM, please contact night-coverage [email or web address] Password [email or web address]  08/25/2017, 12:08 PM

## 2017-08-26 ENCOUNTER — Inpatient Hospital Stay (HOSPITAL_COMMUNITY): Payer: Medicare Other

## 2017-08-26 DIAGNOSIS — I361 Nonrheumatic tricuspid (valve) insufficiency: Secondary | ICD-10-CM

## 2017-08-26 DIAGNOSIS — E039 Hypothyroidism, unspecified: Secondary | ICD-10-CM

## 2017-08-26 DIAGNOSIS — E7800 Pure hypercholesterolemia, unspecified: Secondary | ICD-10-CM

## 2017-08-26 LAB — BASIC METABOLIC PANEL
Anion gap: 10 (ref 5–15)
BUN: 15 mg/dL (ref 6–20)
CO2: 27 mmol/L (ref 22–32)
CREATININE: 0.9 mg/dL (ref 0.44–1.00)
Calcium: 8.7 mg/dL — ABNORMAL LOW (ref 8.9–10.3)
Chloride: 102 mmol/L (ref 101–111)
GFR calc Af Amer: >60 mL/min (ref >60)
GFR calc non Af Amer: 54 mL/min — ABNORMAL LOW (ref >60)
GLUCOSE: 91 mg/dL (ref 65–99)
Potassium: 3.4 mmol/L — ABNORMAL LOW (ref 3.5–5.1)
Sodium: 139 mmol/L (ref 135–145)

## 2017-08-26 LAB — TROPONIN I: Troponin I: 0.05 ng/mL (ref <0.03)

## 2017-08-26 MED ORDER — HYDROCODONE-ACETAMINOPHEN 5-325 MG PO TABS: 1.0000 | ORAL_TABLET | Freq: Four times a day (QID) | ORAL | Status: DC | PRN

## 2017-08-26 NOTE — Progress Notes (Signed)
Report given to [facility] staff. Patient wished to get her lasix after transferring and getting set up with a perwick external urinary catheter. She also wished to ambulate in the hall and ambulated with family member, slow steady gait. Tolerated activity well with no complaints, Needs to continue ambulation to prevent weakness and is aware of that. Patient is being transferred to [facility] room 6 via wheelchair by NT. No complaints at this time.

## 2017-08-26 NOTE — Progress Notes (Addendum)
PROGRESS NOTE  Madison Marquez:[id number] DOB: 02-Sep-1923 DOA: 08/25/2017 PCP: Lajean Manes, MD  HPI/Recap of past 24 hours:  Madison Marquez is a 84 y.o. year old female with medical history significant for hypertension, hyperlipidemia, hypothyroidism who presented on 08/25/2017 with progressive dyspnea on exertion, paroxysmal nocturnal dyspnea lower leg swelling for several weeks.  ED presentation she was found to have BNP elevated to 57.1, chest x-ray showed small bilateral pleural effusions, TSH unremarkable, troponin slightly elevated 0.04.  Patient's dyspnea improved in the past 24 hours status post IV Lasix x1.  Presume symptoms related to new CHF exacerbation, suspect diastolic.     In the past 24 hours, she reports improvement in her shortness of breath while lying down since receiving Lasix last night.   This morning she has no complaints.  Denies any chest pain, abdominal pain, fevers or chills.  Assessment/Plan: Principal Problem: Dyspnea on exertion and worsening PND.  Concern for likely CHF given elevated BNP, small bilateral pleural effusions, and clinical picture; suspect diastolic in nature.  Improving respiratory status status post IV Lasix. -Follow-up TTE, EF for determine best medication management -Continue daily weights, monitor output -Hold on further IV diuresis given improved clinical exam today -PT evaluation, (patient coming from [facility] [place], independent living)  Active Problems:   Hypercholesterolemia, stable.  On no home medications   Hypothyroid, stable.  Continue Synthroid   Chest pressure, resolved.  Troponin x3 stable.  EKG with no ischemic changes.      Code Status: DNR   Family Communication: Family at bedside   Disposition Plan: Awaiting TTE for CHF evaluation   Consultants:  None  Procedures:  TTE pending   Antimicrobials:  None  Cultures:  None  DVT prophylaxis: Lovenox   Objective: Vitals:   08/26/17 0300  08/26/17 0348 08/26/17 0400 08/26/17 0800  BP:   (!) 147/66 (!) 124/49  Pulse:   79 88  Resp: 18  17 18   Temp:   97.6 F (36.4 C) 98.1 F (36.7 C)  TempSrc:   Oral Oral  SpO2:   95% 94%  Weight:  73.1 kg (161 lb 2.5 oz)    Height:  5\' 4"  (1.626 m)      Intake/Output Summary (Last 24 hours) at 08/26/2017 1323 Last data filed at 08/26/2017 0800 Gross per 24 hour  Intake 360 ml  Output 3150 ml  Net -2790 ml   Filed Weights   08/25/17 0817 08/26/17 0348  Weight: 74.8 kg (165 lb) 73.1 kg (161 lb 2.5 oz)    Exam:  General: Lying in bed in Appear in no distress Eyes: anicteric, EOMI ENT: Oral Mucosa clear ,moist. Neck: No appreciable JVD Cardiovascular: regular rate and rhythm, no murmurs, rubs or gallops,  Minimal (non-pitting)edema Respiratory: Normal respiratory effort, Bilateral Air entry equal,Clear to Auscultation, no crackles or wheezes Abdomen: soft, non-distended, non-tender, normal bowel sounds, no guarding or rebound tenderness Skin: No rash Musculoskeletal: no clubbing / cyanosis. No joint deformity upper and lower extremities. Good ROM, no contractures. Normal muscle tone Neurologic: Grossly no focal neuro deficit.Mental status AAOx3, speech normal, Psychiatric:appropriate affect, and mood  Data Reviewed: CBC: Recent Labs  Lab 08/25/17 0810  WBC 6.0  HGB 13.5  HCT 41.2  MCV 92.4  PLT 299   Basic Metabolic Panel: Recent Labs  Lab 08/25/17 0810 08/25/17 1142 08/26/17 0250  NA 141  --  139  K 4.0  --  3.4*  CL 110  --  102  CO2 24  --  27  GLUCOSE 88  --  91  BUN 14  --  15  CREATININE 0.89  --  0.90  CALCIUM 8.9  --  8.7*  MG 2.0 2.0  --    GFR: Estimated Creatinine Clearance: 39.9 mL/min (by C-G formula based on SCr of 0.9 mg/dL). Liver Function Tests: No results for input(s): AST, ALT, ALKPHOS, BILITOT, PROT, ALBUMIN in the last 168 hours. No results for input(s): LIPASE, AMYLASE in the last 168 hours. No results for input(s): AMMONIA in the  last 168 hours. Coagulation Profile: No results for input(s): INR, PROTIME in the last 168 hours. Cardiac Enzymes: Recent Labs  Lab 08/25/17 1142 08/25/17 1713 08/25/17 2309  TROPONINI 0.04* 0.05* 0.05*   BNP (last 3 results) No results for input(s): PROBNP in the last 8760 hours. HbA1C: No results for input(s): HGBA1C in the last 72 hours. CBG: No results for input(s): GLUCAP in the last 168 hours. Lipid Profile: Recent Labs    08/25/17 1303  CHOL 169  HDL 61  LDLCALC 98  TRIG 51  CHOLHDL 2.8   Thyroid Function Tests: Recent Labs    08/25/17 1142  TSH 3.076   Anemia Panel: No results for input(s): VITAMINB12, FOLATE, FERRITIN, TIBC, IRON, RETICCTPCT in the last 72 hours. Urine analysis:    Component Value Date/Time   COLORURINE YELLOW 07/31/2014 [place] 07/31/2014 0949   LABSPEC 1.010 07/31/2014 0949   PHURINE 7.5 07/31/2014 0949   GLUCOSEU NEGATIVE 07/31/2014 0949   HGBUR NEGATIVE 07/31/2014 0949   BILIRUBINUR NEGATIVE 07/31/2014 0949   KETONESUR NEGATIVE 07/31/2014 0949   PROTEINUR NEGATIVE 07/31/2014 0949   UROBILINOGEN 0.2 07/31/2014 0949   NITRITE NEGATIVE 07/31/2014 0949   LEUKOCYTESUR SMALL (A) 07/31/2014 0949   Sepsis Labs: @LABRCNTIP (procalcitonin:4,lacticidven:4)  ) Recent Results (from the past 240 hour(s))  MRSA PCR Screening     Status: None   Collection Time: 08/25/17  7:55 PM  Result Value Ref Range Status   MRSA by PCR NEGATIVE NEGATIVE Final    Comment:        The GeneXpert MRSA Assay (FDA approved for NASAL specimens only), is one component of a comprehensive MRSA colonization surveillance program. It is not intended to diagnose MRSA infection nor to guide or monitor treatment for MRSA infections.       Studies: No results found.  Scheduled Meds: . aspirin  325 mg Oral QHS  . B-complex with vitamin C  1 tablet Oral Daily  . cholecalciferol  1,000 Units Oral Daily  . enoxaparin (LOVENOX) injection  40  mg Subcutaneous Q24H  . furosemide  40 mg Intravenous Q12H  . levothyroxine  75 mcg Oral QAC breakfast  . sodium chloride flush  3 mL Intravenous Q12H  . vitamin B-12  1,000 mcg Oral Daily    Continuous Infusions: . sodium chloride       LOS: 1 day     Desiree Hane, MD [facility] Pager [phone]  If 7PM-7AM, please contact night-coverage [email or web address] Password [email or web address] 08/26/2017, 1:23 PM

## 2017-08-26 NOTE — Progress Notes (Signed)
  Echocardiogram 2D Echocardiogram has been performed.  Madison Marquez T Marinda Tyer 08/26/2017, 6:02 PM

## 2017-08-26 NOTE — Progress Notes (Signed)
Called pharmacy to reschedule lasix

## 2017-08-27 DIAGNOSIS — I5043 Acute on chronic combined systolic (congestive) and diastolic (congestive) heart failure: Secondary | ICD-10-CM

## 2017-08-27 LAB — BASIC METABOLIC PANEL
ANION GAP: 9 (ref 5–15)
BUN: 17 mg/dL (ref 6–20)
CALCIUM: 9 mg/dL (ref 8.9–10.3)
CHLORIDE: 103 mmol/L (ref 101–111)
CO2: 26 mmol/L (ref 22–32)
CREATININE: 0.85 mg/dL (ref 0.44–1.00)
GFR calc non Af Amer: 58 mL/min — ABNORMAL LOW (ref >60)
Glucose, Bld: 94 mg/dL (ref 65–99)
Potassium: 3.2 mmol/L — ABNORMAL LOW (ref 3.5–5.1)
SODIUM: 138 mmol/L (ref 135–145)

## 2017-08-27 LAB — ECHOCARDIOGRAM COMPLETE
Height: 64 in
WEIGHTICAEL: 2638.4 [oz_av]

## 2017-08-27 LAB — TROPONIN I: Troponin I: 0.05 ng/mL (ref <0.03)

## 2017-08-27 MED ORDER — FUROSEMIDE 10 MG/ML IJ SOLN
20.0000 mg | Freq: Two times a day (BID) | INTRAMUSCULAR | Status: DC
  Administered 2017-08-27: 20 mg via INTRAVENOUS
  Filled 2017-08-27: qty 2

## 2017-08-27 MED ORDER — FUROSEMIDE 20 MG PO TABS
20.0000 mg | ORAL_TABLET | Freq: Every morning | ORAL | Status: DC
  Administered 2017-08-28 – 2017-08-30 (×3): 20 mg via ORAL
  Filled 2017-08-27 (×3): qty 1

## 2017-08-27 MED ORDER — NITROGLYCERIN 0.4 MG SL SUBL
SUBLINGUAL_TABLET | SUBLINGUAL | Status: AC
  Filled 2017-08-27: qty 1

## 2017-08-27 MED ORDER — ASPIRIN 325 MG PO TABS: 325.0000 mg | ORAL_TABLET | Freq: Four times a day (QID) | ORAL | Status: DC

## 2017-08-27 MED ORDER — ZOLPIDEM TARTRATE 5 MG PO TABS
5.0000 mg | ORAL_TABLET | Freq: Once | ORAL | Status: DC
  Filled 2017-08-27: qty 1

## 2017-08-27 MED ORDER — ASPIRIN 325 MG PO TABS: 325.0000 mg | ORAL_TABLET | Freq: Four times a day (QID) | ORAL | Status: DC | PRN

## 2017-08-27 MED ORDER — SPIRONOLACTONE 25 MG PO TABS
25.0000 mg | ORAL_TABLET | Freq: Every morning | ORAL | Status: DC
  Administered 2017-08-28 – 2017-08-30 (×3): 25 mg via ORAL
  Filled 2017-08-27 (×3): qty 1

## 2017-08-27 MED ORDER — ASPIRIN 325 MG PO TABS
325.0000 mg | ORAL_TABLET | Freq: Every day | ORAL | Status: DC
  Administered 2017-08-27 – 2017-08-29 (×3): 325 mg via ORAL
  Filled 2017-08-27 (×3): qty 1

## 2017-08-27 MED ORDER — LISINOPRIL 5 MG PO TABS
5.0000 mg | ORAL_TABLET | Freq: Every day | ORAL | Status: DC
  Administered 2017-08-28 – 2017-08-30 (×3): 5 mg via ORAL
  Filled 2017-08-27 (×3): qty 1

## 2017-08-27 MED ORDER — NITROGLYCERIN 0.4 MG SL SUBL
0.4000 mg | SUBLINGUAL_TABLET | SUBLINGUAL | Status: DC | PRN
  Administered 2017-08-27: 0.4 mg via SUBLINGUAL

## 2017-08-27 MED ORDER — LISINOPRIL 2.5 MG PO TABS
2.5000 mg | ORAL_TABLET | Freq: Every day | ORAL | Status: DC
  Administered 2017-08-27: 2.5 mg via ORAL
  Filled 2017-08-27: qty 1

## 2017-08-27 MED ORDER — CARVEDILOL 3.125 MG PO TABS
3.1250 mg | ORAL_TABLET | Freq: Two times a day (BID) | ORAL | Status: DC
  Administered 2017-08-28 – 2017-08-29 (×4): 3.125 mg via ORAL
  Filled 2017-08-27 (×4): qty 1

## 2017-08-27 MED ORDER — CARVEDILOL 3.125 MG PO TABS
1.5625 mg | ORAL_TABLET | Freq: Two times a day (BID) | ORAL | Status: DC
  Administered 2017-08-27: 1.5625 mg via ORAL
  Filled 2017-08-27: qty 1

## 2017-08-27 MED ORDER — POTASSIUM CHLORIDE CRYS ER 20 MEQ PO TBCR
40.0000 meq | EXTENDED_RELEASE_TABLET | Freq: Four times a day (QID) | ORAL | Status: AC
  Administered 2017-08-27 (×2): 40 meq via ORAL
  Filled 2017-08-27 (×2): qty 2

## 2017-08-27 NOTE — Progress Notes (Signed)
MD O[place] notified of episode of SVT at 1120 this morning, patient was attempting to push up in bed and reposition herself, patient asymptomatic with no complaints of SOB, chest pain. No new orders given. Will continue to monitor patient. Neta Mends RN 12:12 PM 08-27-2017

## 2017-08-27 NOTE — Evaluation (Signed)
Physical Therapy Evaluation Patient Details Name: Madison Marquez MRN: [id number] DOB: [birth year] Today's Date: 08/27/2017   History of Present Illness  84 yo female c/o worsening dyspnea on exertion, PND. DX with acute CHF. PMH HTN, hypothyroidism, hx L carpal tunnel release, hernia repair  Clinical Impression   RN confirms patient experienced run of SVT this morning, MDs are aware and RN reports they are continuing to monitor patient; she has been up walking earlier today and per RN is medically stable for PT to see this afternoon. Patient received in bed, pleasant and willing to work with skilled PT services. She is able to complete bed mobility with Mod(I), functional transfers and gait with S in general; note gait distance was limited by fatigue this afternoon, patient with significant SOB following gait period that resolved with seated rest. Patient left in bed with all needs met and questions addressed this afternoon.     Follow Up Recommendations Home health PT    Equipment Recommendations  3in1 (PT)    Recommendations for Other Services       Precautions / Restrictions Precautions Precautions: Fall Precaution Comments: L wrist injury following fall out of chair at home  Restrictions Weight Bearing Restrictions: No      Mobility  Bed Mobility Overal bed mobility: Modified Independent             General bed mobility comments: extended time   Transfers Overall transfer level: Needs assistance Equipment used: None Transfers: Sit to/from Stand Sit to Stand: Supervision         General transfer comment: S for balance and safety  Ambulation/Gait Ambulation/Gait assistance: Supervision Ambulation Distance (Feet): 100 Feet Assistive device: None Gait Pattern/deviations: Step-through pattern;Decreased step length - right;Decreased step length - left;Trunk flexed     General Gait Details: gait limited by fatigue   Stairs            Wheelchair  Mobility    Modified Rankin (Stroke Patients Only)       Balance Overall balance assessment: Needs assistance Sitting-balance support: Single extremity supported;Feet supported Sitting balance-Leahy Scale: Good     Standing balance support: No upper extremity supported;During functional activity Standing balance-Leahy Scale: Fair                               Pertinent Vitals/Pain Pain Assessment: No/denies pain    Home Living Family/patient expects to be discharged to:: Private residence Living Arrangements: Alone Available Help at Discharge: Family Type of Home: Apartment Home Access: Level entry     Home Layout: One level Home Equipment: Environmental consultant - 2 wheels;Wheelchair - [employer] - 4 wheels;Cane - single point      Prior Function Level of Independence: Independent         Comments: lives in independent living facility      Hand Dominance   Dominant Hand: Right    Extremity/Trunk Assessment   Upper Extremity Assessment Upper Extremity Assessment: Overall WFL for tasks assessed    Lower Extremity Assessment Lower Extremity Assessment: Generalized weakness    Cervical / Trunk Assessment Cervical / Trunk Assessment: Normal  Communication   Communication: No difficulties  Cognition Arousal/Alertness: Awake/alert Behavior During Therapy: WFL for tasks assessed/performed Overall Cognitive Status: Within Functional Limits for tasks assessed  General Comments      Exercises     Assessment/Plan    PT Assessment Patient needs continued PT services  PT Problem List Decreased strength;Decreased mobility;Decreased coordination;Decreased activity tolerance;Decreased balance       PT Treatment Interventions DME instruction;Therapeutic activities;Gait training;Therapeutic exercise;Stair training;Balance training;Functional mobility training;Neuromuscular re-education;Patient/family  education    PT Goals (Current goals can be found in the Care Plan section)  Acute Rehab PT Goals Patient Stated Goal: to go home  PT Goal Formulation: With patient Time For Goal Achievement: 09/03/17 Potential to Achieve Goals: Good    Frequency Min 3X/week   Barriers to discharge        Co-evaluation               AM-PAC PT "6 Clicks" Daily Activity  Outcome Measure Difficulty turning over in bed (including adjusting bedclothes, sheets and blankets)?: None Difficulty moving from lying on back to sitting on the side of the bed? : None Difficulty sitting down on and standing up from a chair with arms (e.g., wheelchair, bedside commode, etc,.)?: None Help needed moving to and from a bed to chair (including a wheelchair)?: None Help needed walking in hospital room?: None Help needed climbing 3-5 steps with a railing? : A Little 6 Click Score: 23    End of Session   Activity Tolerance: Patient limited by fatigue Patient left: in bed;with call bell/phone within reach   PT Visit Diagnosis: Unsteadiness on feet (R26.81);Muscle weakness (generalized) (M62.81);Difficulty in walking, not elsewhere classified (R26.2);History of falling (Z91.81)    Time: 1165-7903 PT Time Calculation (min) (ACUTE ONLY): 14 min   Charges:   PT Evaluation $PT Eval Low Complexity: 1 Low     PT G Codes:   PT G-Codes **NOT FOR INPATIENT CLASS** Functional Assessment Tool Used: AM-PAC 6 Clicks Basic Mobility;Clinical judgement    Deniece Ree PT, DPT, CBIS  Supplemental Physical Therapist [place]

## 2017-08-27 NOTE — Progress Notes (Signed)
PROGRESS NOTE  Madison Marquez RJJ:[id number] DOB: 11/30/1923 DOA: 08/25/2017 PCP: Lajean Manes, MD  HPI/Recap of past 24 hours:  Madison Marquez is a 84 y.o. year old female with medical history significant for hypertension, hyperlipidemia, hypothyroidism who presented on 08/25/2017 with progressive dyspnea on exertion, paroxysmal nocturnal dyspnea lower leg swelling for several weeks.  ED presentation she was found to have BNP elevated to 57.1, chest x-ray showed small bilateral pleural effusions, TSH unremarkable, troponin slightly elevated 0.04.  Patient's dyspnea improved in the past 24 hours status post IV Lasix x1.  Presume symptoms related to new CHF exacerbation, suspect diastolic.  08/27/17 - Patient seen alongside patient's son. Patient still has intermittent SOB/DOE. Minimal pedal edema is noted. ECHO revealed EF of 01-60% with diastolic dysfunction. I have consulted Dr. Adrian Prows with Cardiology (Case also discussed with Dr. Einar Gip).   Assessment/Plan: Principal Problem: - Acute CHF, combined systolic and diastolic - Will add Lisinopril 2.5mg  po once daily, Coreg 1.5625mg  po bid and IV lasix 20mg  bid. Will monitor renal function and electrolytes closely. Cardiology (Dr. Adrian Prows) consulted. Weigh patient daily. Follow PT Eval.  Active Problems: - Hypokalemia - Replete   - Hypercholesterolemia - Optimize - Hypothyroid - TSH within normal range (3.076)    Code Status: DNR   Family Communication: Family at bedside   Disposition Plan: Cardiology consulted   Consultants:  None  Procedures:  TTE   Antimicrobials:  None  Cultures:  None  DVT prophylaxis: Lovenox   Objective: Vitals:   08/26/17 1450 08/26/17 2027 08/27/17 0056 08/27/17 0513  BP: (!) 127/58 (!) 143/56 110/66 (!) 130/55  Pulse: 83 82 73 74  Resp: 18 18 18 18   Temp: 97.9 F (36.6 C) 98.1 F (36.7 C) 98 F (36.7 C) 97.8 F (36.6 C)  TempSrc: Oral Oral Oral Oral  SpO2: 93% 95% 94% 93%    Weight: 74.8 kg (164 lb 14.4 oz)   73.3 kg (161 lb 11.2 oz)  Height: 5\' 4"  (1.626 m)       Intake/Output Summary (Last 24 hours) at 08/27/2017 1134 Last data filed at 08/27/2017 0609 Gross per 24 hour  Intake 360 ml  Output 1700 ml  Net -1340 ml   Filed Weights   08/26/17 0348 08/26/17 1450 08/27/17 0513  Weight: 73.1 kg (161 lb 2.5 oz) 74.8 kg (164 lb 14.4 oz) 73.3 kg (161 lb 11.2 oz)    Exam:  General: Not in distress. Awake and alert. Eyes: anicteric, EOMI ENT: Oral Mucosa clear ,moist. Neck: No appreciable JVD Cardiovascular: S1S2 Respiratory: Clear bilaterally Abdomen: soft, non-distended, non-tender, normal bowel sounds, no guarding or rebound tenderness Extremities- Very minimal pedal edema. Neurologic: Grossly no focal neuro deficit.Mental status AAOx3, speech normal,  Data Reviewed: CBC: Recent Labs  Lab 08/25/17 0810  WBC 6.0  HGB 13.5  HCT 41.2  MCV 92.4  PLT 109   Basic Metabolic Panel: Recent Labs  Lab 08/25/17 0810 08/25/17 1142 08/26/17 0250 08/27/17 0634  NA 141  --  139 138  K 4.0  --  3.4* 3.2*  CL 110  --  102 103  CO2 24  --  27 26  GLUCOSE 88  --  91 94  BUN 14  --  15 17  CREATININE 0.89  --  0.90 0.85  CALCIUM 8.9  --  8.7* 9.0  MG 2.0 2.0  --   --    GFR: Estimated Creatinine Clearance: 42.3 mL/min (by C-G formula based on SCr  of 0.85 mg/dL). Liver Function Tests: No results for input(s): AST, ALT, ALKPHOS, BILITOT, PROT, ALBUMIN in the last 168 hours. No results for input(s): LIPASE, AMYLASE in the last 168 hours. No results for input(s): AMMONIA in the last 168 hours. Coagulation Profile: No results for input(s): INR, PROTIME in the last 168 hours. Cardiac Enzymes: Recent Labs  Lab 08/25/17 1142 08/25/17 1713 08/25/17 2309  TROPONINI 0.04* 0.05* 0.05*   BNP (last 3 results) No results for input(s): PROBNP in the last 8760 hours. HbA1C: No results for input(s): HGBA1C in the last 72 hours. CBG: No results for  input(s): GLUCAP in the last 168 hours. Lipid Profile: Recent Labs    08/25/17 1303  CHOL 169  HDL 61  LDLCALC 98  TRIG 51  CHOLHDL 2.8   Thyroid Function Tests: Recent Labs    08/25/17 1142  TSH 3.076   Anemia Panel: No results for input(s): VITAMINB12, FOLATE, FERRITIN, TIBC, IRON, RETICCTPCT in the last 72 hours. Urine analysis:    Component Value Date/Time   COLORURINE YELLOW 07/31/2014 [place] 07/31/2014 0949   LABSPEC 1.010 07/31/2014 0949   PHURINE 7.5 07/31/2014 0949   GLUCOSEU NEGATIVE 07/31/2014 0949   HGBUR NEGATIVE 07/31/2014 0949   BILIRUBINUR NEGATIVE 07/31/2014 0949   KETONESUR NEGATIVE 07/31/2014 0949   PROTEINUR NEGATIVE 07/31/2014 0949   UROBILINOGEN 0.2 07/31/2014 0949   NITRITE NEGATIVE 07/31/2014 0949   LEUKOCYTESUR SMALL (A) 07/31/2014 0949   Sepsis Labs: @LABRCNTIP (procalcitonin:4,lacticidven:4)  ) Recent Results (from the past 240 hour(s))  MRSA PCR Screening     Status: None   Collection Time: 08/25/17  7:55 PM  Result Value Ref Range Status   MRSA by PCR NEGATIVE NEGATIVE Final    Comment:        The GeneXpert MRSA Assay (FDA approved for NASAL specimens only), is one component of a comprehensive MRSA colonization surveillance program. It is not intended to diagnose MRSA infection nor to guide or monitor treatment for MRSA infections.       Studies: No results found.  Scheduled Meds: . aspirin  325 mg Oral QHS  . B-complex with vitamin C  1 tablet Oral Daily  . cholecalciferol  1,000 Units Oral Daily  . enoxaparin (LOVENOX) injection  40 mg Subcutaneous Q24H  . furosemide  20 mg Intravenous BID  . levothyroxine  75 mcg Oral QAC breakfast  . potassium chloride  40 mEq Oral Q6H  . sodium chloride flush  3 mL Intravenous Q12H  . vitamin B-12  1,000 mcg Oral Daily    Continuous Infusions: . sodium chloride       LOS: 2 days     Bonnell Public, MD [facility] Pager [phone]  If  7PM-7AM, please contact night-coverage [email or web address] Password [facility] 08/27/2017, 11:34 AM

## 2017-08-27 NOTE — Consult Note (Addendum)
Reason for Consult:CHF Referring Physician: Morrison Old, MD  Madison Marquez is an 84 y.o. female.  HPI: Madison Marquez is a 84 y.o. year old female with medical history significant for hypertension, hypothyroidism who presented on 08/25/2017 with progressive dyspnea on exertion, paroxysmal nocturnal dyspnea lower leg swelling for several weeks.  On further questioning, she states that over the past 1 year she has noticed gradual onset of worsening dyspnea and also marked fatigue.  2 days prior to the presentation, she could not even do minimal activities around the house and hence presented to the ED.  She was found to be in congestive heart failure.  I now been consulted for evaluation and management of heart failure with decreased LVEF.  Patient states that since admission to the hospital, her symptoms of dyspnea has improved.  She has noticed marked generalized weakness since admission to the hospital.  She has not had any chest pain until this afternoon, went for a walk in the hallway and then when she laid down she has been complaining of upper abdominal discomfort that has been constant.  Thinks that she has had a heart attack.  Her daughter is present at the bedside.  Her symptoms of PND and orthopnea has resolved, leg edema is also improved.  She denies any fever, chills, hemoptysis, dizziness or syncope.  Although 84 years of age, lives in assisted living facility but is fairly independent.  Past Medical History:  Diagnosis Date  . Acute CHF (congestive heart failure) ([place]) 08/25/2017  . Arthritis    "probably in my right knee" (08/25/2017)  . [facility] Patient infection [phone]  . Hypercholesterolemia   . Hypothyroid   . Migraine    "I've had 1 in my lifetime" (08/25/2017)    Past Surgical History:  Procedure Laterality Date  . ABDOMINAL HYSTERECTOMY    . ANKLE FRACTURE SURGERY Right   . BLADDER SUSPENSION    . BREAST LUMPECTOMY Left   . BREAST SURGERY Left    "leaky nipple"   . CARPAL TUNNEL RELEASE Left 10/15/2016   Procedure: LEFT CARPAL TUNNEL RELEASE;  Surgeon: Daryll Brod, MD;  Location: [facility];  Service: Orthopedics;  Laterality: Left;  . CATARACT EXTRACTION W/ INTRAOCULAR LENS  IMPLANT, BILATERAL Bilateral 2017  . FRACTURE SURGERY    . HERNIA REPAIR    . UMBILICAL HERNIA REPAIR      History reviewed. No pertinent family history.  Social History:  reports that  has never smoked. she has never used smokeless tobacco. She reports that she does not drink alcohol or use drugs.  Allergies: No Known Allergies   Results for orders placed or performed during the hospital encounter of 08/25/17 (from the past 48 hour(s))  MRSA PCR Screening     Status: None   Collection Time: 08/25/17  7:55 PM  Result Value Ref Range   MRSA by PCR NEGATIVE NEGATIVE    Comment:        The GeneXpert MRSA Assay (FDA approved for NASAL specimens only), is one component of a comprehensive MRSA colonization surveillance program. It is not intended to diagnose MRSA infection nor to guide or monitor treatment for MRSA infections.   Troponin I (q 6hr x 3)     Status: Abnormal   Collection Time: 08/25/17 11:09 PM  Result Value Ref Range   Troponin I 0.05 (HH) <0.03 ng/mL    Comment: CRITICAL VALUE NOTED.  VALUE IS CONSISTENT WITH PREVIOUSLY REPORTED AND CALLED VALUE.  Basic metabolic  panel     Status: Abnormal   Collection Time: 08/26/17  2:50 AM  Result Value Ref Range   Sodium 139 135 - 145 mmol/L   Potassium 3.4 (L) 3.5 - 5.1 mmol/L   Chloride 102 101 - 111 mmol/L   CO2 27 22 - 32 mmol/L   Glucose, Bld 91 65 - 99 mg/dL   BUN 15 6 - 20 mg/dL   Creatinine, Ser 0.90 0.44 - 1.00 mg/dL   Calcium 8.7 (L) 8.9 - 10.3 mg/dL   GFR calc non Af Amer 54 (L) >60 mL/min   GFR calc Af Amer >60 >60 mL/min    Comment: (NOTE) The eGFR has been calculated using the CKD EPI equation. This calculation has not been validated in all clinical situations. eGFR's  persistently <60 mL/min signify possible Chronic Kidney Disease.    Anion gap 10 5 - 15  Basic metabolic panel     Status: Abnormal   Collection Time: 08/27/17  6:34 AM  Result Value Ref Range   Sodium 138 135 - 145 mmol/L   Potassium 3.2 (L) 3.5 - 5.1 mmol/L   Chloride 103 101 - 111 mmol/L   CO2 26 22 - 32 mmol/L   Glucose, Bld 94 65 - 99 mg/dL   BUN 17 6 - 20 mg/dL   Creatinine, Ser 0.85 0.44 - 1.00 mg/dL   Calcium 9.0 8.9 - 10.3 mg/dL   GFR calc non Af Amer 58 (L) >60 mL/min   GFR calc Af Amer >60 >60 mL/min    Comment: (NOTE) The eGFR has been calculated using the CKD EPI equation. This calculation has not been validated in all clinical situations. eGFR's persistently <60 mL/min signify possible Chronic Kidney Disease.    Anion gap 9 5 - 15  Troponin I (q 6hr x 3)     Status: Abnormal   Collection Time: 08/27/17  2:21 PM  Result Value Ref Range   Troponin I 0.05 (HH) <0.03 ng/mL    Comment: CRITICAL VALUE NOTED.  VALUE IS CONSISTENT WITH PREVIOUSLY REPORTED AND CALLED VALUE.    Review of Systems  Constitutional: Positive for malaise/fatigue. Negative for diaphoresis.  HENT: Negative.   Eyes: Negative.   Respiratory: Positive for cough, shortness of breath and wheezing. Negative for hemoptysis and sputum production.   Cardiovascular: Positive for orthopnea, leg swelling and PND. Negative for chest pain, palpitations and claudication.  Gastrointestinal: Negative.   Genitourinary: Negative.   Musculoskeletal: Negative.   Skin: Negative.   Neurological: Positive for weakness.  Endo/Heme/Allergies: Negative.   Psychiatric/Behavioral: Negative.    Blood pressure (!) 116/54, pulse 94, temperature 97.6 F (36.4 C), temperature source Oral, resp. rate 18, height _0  (1.626 m), weight 73.3 kg (161 lb 11.2 oz), SpO2 98 %. Physical Exam  Constitutional: She is oriented to person, place, and time. She appears well-developed and well-nourished. No distress.  HENT:  Head:  Atraumatic.  Eyes: Conjunctivae are normal.  Neck: Normal range of motion. Neck supple. JVD (Above the clavicle and Hepato-Jugular reflux present) present.  Cardiovascular: Normal rate and regular rhythm. Exam reveals no gallop and no friction rub.  Murmur (II/VI SEM in aortic area) heard. Respiratory: Effort normal and breath sounds normal.  GI: Soft. Bowel sounds are normal.  Musculoskeletal: Normal range of motion. She exhibits no edema.  Neurological: She is alert and oriented to person, place, and time.  Skin: Skin is dry.  Psychiatric: She has a normal mood and affect. Her behavior is normal.  Scheduled Meds: . aspirin  325 mg Oral Daily  . B-complex with vitamin C  1 tablet Oral Daily  . carvedilol  1.5625 mg Oral BID WC  . cholecalciferol  1,000 Units Oral Daily  . enoxaparin (LOVENOX) injection  40 mg Subcutaneous Q24H  . furosemide  20 mg Intravenous BID  . levothyroxine  75 mcg Oral QAC breakfast  . lisinopril  2.5 mg Oral Daily  . sodium chloride flush  3 mL Intravenous Q12H  . vitamin B-12  1,000 mcg Oral Daily   Continuous Infusions: . sodium chloride     PRN Meds:.sodium chloride, acetaminophen, nitroGLYCERIN, ondansetron (ZOFRAN) IV, sodium chloride flush  EKG 08/25/2017: Normal sinus rhythm at rate of 75 bpm, poor R wave progression, cannot exclude anteroseptal infarct old.  Incomplete LBBB.  Nonspecific T abnormality.  No significant change compared to 04/07/2017.  Echocardiogram 08/26/2017: Normal LV size, moderate LVH, moderately reduced LVEF at 35-40% with diffuse hypokinesis and grade 2 diastolic dysfunction.  Moderately thickened and severely calcified aortic valve without stenosis, moderate aortic regurgitation present.  Mild pulmonary hypertension, PA pressure 32 mmHg.  Left pleural effusion.  Assessment/Plan: 1.  Acute systolic and diastolic heart failure 2.  Cardiomyopathy, suspect may be related to nonischemic cardiomyopathy in view of no regional wall  motion abnormality.  May be related to hypertensive heart disease versus idiopathic. 3.  Hypertension   Recommendation: Patient appears to be now relatively much better compensated.  Outpatient physical therapy may be necessary.  We can increase her Coreg to 3.125 mg p.o. twice daily and also lisinopril to 5 mg daily, change to p.o. furosemide at 20 mg once a day in the morning.  I will also add Aldactone 25 mg in the morning.  She will need a BMP in 1 week.  This should help with hypokalemia.  With regard to ischemic workup, given her advanced age, we could either consider outpatient stress test versus cardiac catheterization if cardiac markers were to increase as she has had upper abdominal/lower chest pain today but I doubt this is ACS.  In a patient with no history of diabetes, non-smoker, normal lipids, my suspicion that this is ischemic cardiomyopathy is relatively low.  All questions regarding management were discussed in detail with the patient and her daughter at the bedside.  Low-salt diet was also discussed in view of systolic and diastolic heart failure.  Although there is mention of hyperlipidemia in the history, lipids are totally within normal limits.  Adrian Prows 08/27/2017, 6:45 PM

## 2017-08-27 NOTE — Care Management Note (Addendum)
Case Management Note  Patient Details  Name: Madison Marquez MRN: [id number] Date of Birth: 06-Feb-1924  Subjective/Objective:    Admitted for Congestive Heart failure.               Action/Plan: In to speak with patient, son-in-law at bedside.  Prior to admission patient lived at [facility] living facility. PCP is Hal Stoneking. Uses [facility] on [address] in [place].  Denies use of Home DME: Rollator, Cane and walker.  Does have a scale and weighs herself daily.   Transportation is provided at living facility  If needed.  Patient denies inability to afford medication/food.  Patient states she has not used a Acupuncturist recently.  NCM will continue to follow for discharge needs.  Expected Discharge Plan:  Assisted Living / Rest Home  Discharge planning Services  CM Consult  Status of Service:  In process, will continue to follow  Kristen Cardinal, RN 08/27/2017, 10:27 AM

## 2017-08-27 NOTE — Progress Notes (Signed)
MD notified of patient's complaint of chest pain, originally rated chest pain 10/10, 1 tablet of nitro given; placed on 2liters of O2; patient now rates pain 6/10, ekg obtained, and adult emergency standing orders entered, MD ordered troponin q6h stat, and aspirin 325 daily, orders entered. Will continue to monitor patient. Neta Mends RN 2:01 PM 08-27-2016

## 2017-08-27 NOTE — Progress Notes (Signed)
CCMD notified me of 10 beats of V-tach. After evaluating, the patient had wide QRS's and was asymptomatic. Will continue to monitor.   Drue Flirt, RN

## 2017-08-28 DIAGNOSIS — I5041 Acute combined systolic (congestive) and diastolic (congestive) heart failure: Secondary | ICD-10-CM

## 2017-08-28 LAB — BASIC METABOLIC PANEL
ANION GAP: 8 (ref 5–15)
BUN: 17 mg/dL (ref 6–20)
CALCIUM: 8.8 mg/dL — AB (ref 8.9–10.3)
CO2: 25 mmol/L (ref 22–32)
CREATININE: 0.84 mg/dL (ref 0.44–1.00)
Chloride: 104 mmol/L (ref 101–111)
GFR, EST NON AFRICAN AMERICAN: 59 mL/min — AB (ref >60)
Glucose, Bld: 84 mg/dL (ref 65–99)
Potassium: 4.1 mmol/L (ref 3.5–5.1)
Sodium: 137 mmol/L (ref 135–145)

## 2017-08-28 LAB — TROPONIN I: Troponin I: 0.04 ng/mL (ref <0.03)

## 2017-08-28 MED ORDER — GI COCKTAIL ~~LOC~~
30.0000 mL | Freq: Two times a day (BID) | ORAL | Status: DC | PRN
  Administered 2017-08-28: 30 mL via ORAL
  Filled 2017-08-28: qty 30

## 2017-08-28 NOTE — Progress Notes (Signed)
[facility] choice offered, pt chose [facility]; [place] with [facility] called for arrangements; Aneta Mins [phone]

## 2017-08-28 NOTE — Consult Note (Addendum)
   [facility] CM Inpatient Consult   08/28/2017  Madison Marquez 03-Oct-1923 [id number]   Referral received from Dr. Marthenia Rolling stating patient for possible transition to home on tomorrow.  Chart review reveals the patieint is 84 years old with a HX of progressive dyspnea and now with acute HF.  Came by to see patient but she was just starting to eat her lunch and this writer will follow up for assessment of disposition needs and placed a brochure with 24 hour nurse advise line information at the bedside.  Will follow up for disposition of needs and with inpatient RNCM.    For questions, please contact:  Natividad Brood, RN BSN [facility] Liaison  [phone] business mobile phone Toll free office [phone]   1500: Came back by and patient was sound asleep.  Son at the bedside.  Will plan to follow up tomorrow.

## 2017-08-28 NOTE — Progress Notes (Signed)
Physical Therapy Treatment Patient Details Name: Madison Marquez MRN: [id number] DOB: 21-Aug-1924 Today's Date: 08/28/2017    History of Present Illness 84 yo female c/o worsening dyspnea on exertion, PND. DX with acute CHF. PMH HTN, hypothyroidism, hx L carpal tunnel release, hernia repair    PT Comments    Pt making steady progress with mobility. Pt a little anxious about ambulating after her chest pain yesterday but amb well without any pain.    Follow Up Recommendations  Home health PT     Equipment Recommendations  3in1 (PT)    Recommendations for Other Services       Precautions / Restrictions Precautions Precautions: Fall Precaution Comments: L wrist injury following fall out of chair at home  Restrictions Weight Bearing Restrictions: No    Mobility  Bed Mobility               General bed mobility comments: Pt up in chair  Transfers Overall transfer level: Needs assistance Equipment used: None Transfers: Sit to/from Stand Sit to Stand: Supervision         General transfer comment: supervision for balance and safety  Ambulation/Gait Ambulation/Gait assistance: Supervision Ambulation Distance (Feet): 200 Feet Assistive device: 4-wheeled walker;None Gait Pattern/deviations: Step-through pattern;Decreased step length - right;Decreased step length - left;Trunk flexed     General Gait Details: Supervision for safety. Used rollator for safety. RHR - 75, EHR -89   Stairs            Wheelchair Mobility    Modified Rankin (Stroke Patients Only)       Balance Overall balance assessment: Needs assistance Sitting-balance support: Feet supported;No upper extremity supported Sitting balance-Leahy Scale: Good     Standing balance support: No upper extremity supported;During functional activity Standing balance-Leahy Scale: Fair                              Cognition Arousal/Alertness: Awake/alert Behavior During Therapy: WFL for  tasks assessed/performed Overall Cognitive Status: Within Functional Limits for tasks assessed                                        Exercises      General Comments        Pertinent Vitals/Pain      Home Living                      Prior Function            PT Goals (current goals can now be found in the care plan section) Progress towards PT goals: Progressing toward goals    Frequency    Min 3X/week      PT Plan Current plan remains appropriate    Co-evaluation              AM-PAC PT "6 Clicks" Daily Activity  Outcome Measure  Difficulty turning over in bed (including adjusting bedclothes, sheets and blankets)?: None Difficulty moving from lying on back to sitting on the side of the bed? : None Difficulty sitting down on and standing up from a chair with arms (e.g., wheelchair, bedside commode, etc,.)?: None Help needed moving to and from a bed to chair (including a wheelchair)?: A Little Help needed walking in hospital room?: A Little Help needed climbing 3-5 steps with a railing? : A Little 6  Click Score: 21    End of Session   Activity Tolerance: Patient tolerated treatment well Patient left: with call bell/phone within reach;in chair;with family/visitor present Nurse Communication: Mobility status PT Visit Diagnosis: Unsteadiness on feet (R26.81);Muscle weakness (generalized) (M62.81);Difficulty in walking, not elsewhere classified (R26.2);History of falling (Z91.81)     Time: 9791-5041 PT Time Calculation (min) (ACUTE ONLY): 10 min  Charges:  $Gait Training: 8-22 mins                    G Codes:       [facility] PT [place] 08/28/2017, 11:51 AM

## 2017-08-28 NOTE — Progress Notes (Signed)
Will arrange outpatient stress test and office follow up.  Nigel Mormon, MD [facility] Pager: [phone] Office: [phone] If no answer Cell [phone]

## 2017-08-28 NOTE — Progress Notes (Signed)
PROGRESS NOTE  Madison Marquez TKZ:[id number] DOB: Feb 23, 1924 DOA: 08/25/2017 PCP: Lajean Manes, MD  Summary:  Madison Marquez is a 84 y.o. year old female with medical history significant for hypertension, hyperlipidemia, hypothyroidism who presented on 08/25/2017 with progressive dyspnea on exertion, paroxysmal nocturnal dyspnea lower leg swelling for several weeks.  ED presentation she was found to have BNP elevated to 57.1, chest x-ray showed small bilateral pleural effusions, TSH unremarkable, troponin slightly elevated 0.04.  Patient's dyspnea improved in the past 24 hours status post IV Lasix x1.  Presume symptoms related to new CHF exacerbation, suspect diastolic.  08/28/17 - Patient seen alongside patient's son in law. ECHO revealed EF of 57-32% with diastolic dysfunction. Cardiology input is appreciated. Medications are being adjusted. Will continue to monitor patient for the next 24 Hours on current Medication regimen.   Assessment/Plan: Principal Problem: - Acute CHF, combined systolic and diastolic - On Lisinopril, Coreg and oral Lasix. Cardiology intends to add Spironolactone. Will monitor renal function and electrolytes closely, as well as BP. Cardiology (Dr. Adrian Prows) input is appreciated. Weigh patient daily. Follow PT Eval. Likely DC in am.  Active Problems: - Hypokalemia - Resolved   - Hypercholesterolemia - Optimize - Hypothyroid - TSH within normal range (3.076)    Code Status: DNR   Family Communication: Family at bedside   Disposition Plan: Cardiology consulted.   Consultants:  None  Procedures:  TTE   Antimicrobials:  None  Cultures:  None  DVT prophylaxis: Lovenox   Objective: Vitals:   08/27/17 1352 08/27/17 2253 08/28/17 0453 08/28/17 0827  BP: (!) 116/54 (!) 123/56 (!) 129/58 (!) 111/53  Pulse: 94 83 79 81  Resp:  16 18   Temp:  (!) 97.5 F (36.4 C) 97.7 F (36.5 C)   TempSrc:  Oral Oral   SpO2:  95% 97% 95%  Weight:   73.9 kg  (163 lb)   Height:        Intake/Output Summary (Last 24 hours) at 08/28/2017 1313 Last data filed at 08/28/2017 1247 Gross per 24 hour  Intake 480 ml  Output 900 ml  Net -420 ml   Filed Weights   08/26/17 1450 08/27/17 0513 08/28/17 0453  Weight: 74.8 kg (164 lb 14.4 oz) 73.3 kg (161 lb 11.2 oz) 73.9 kg (163 lb)    Exam:  General: Not in distress. Awake and alert. Eyes: anicteric, EOMI ENT: Oral Mucosa clear ,moist. Neck: No appreciable JVD Cardiovascular: S1S2 Respiratory: Clear bilaterally Abdomen: soft, non-distended, non-tender, normal bowel sounds, no guarding or rebound tenderness Extremities- Very minimal pedal edema. Neurologic: Grossly no focal neuro deficit.Mental status AAOx3, speech normal,  Data Reviewed: CBC: Recent Labs  Lab 08/25/17 0810  WBC 6.0  HGB 13.5  HCT 41.2  MCV 92.4  PLT 202   Basic Metabolic Panel: Recent Labs  Lab 08/25/17 0810 08/25/17 1142 08/26/17 0250 08/27/17 0634 08/28/17 0432  NA 141  --  139 138 137  K 4.0  --  3.4* 3.2* 4.1  CL 110  --  102 103 104  CO2 24  --  27 26 25   GLUCOSE 88  --  91 94 84  BUN 14  --  15 17 17   CREATININE 0.89  --  0.90 0.85 0.84  CALCIUM 8.9  --  8.7* 9.0 8.8*  MG 2.0 2.0  --   --   --    GFR: Estimated Creatinine Clearance: 43 mL/min (by C-G formula based on SCr of 0.84 mg/dL).  Liver Function Tests: No results for input(s): AST, ALT, ALKPHOS, BILITOT, PROT, ALBUMIN in the last 168 hours. No results for input(s): LIPASE, AMYLASE in the last 168 hours. No results for input(s): AMMONIA in the last 168 hours. Coagulation Profile: No results for input(s): INR, PROTIME in the last 168 hours. Cardiac Enzymes: Recent Labs  Lab 08/25/17 1142 08/25/17 1713 08/25/17 2309 08/27/17 1421 08/28/17 0741  TROPONINI 0.04* 0.05* 0.05* 0.05* 0.04*   BNP (last 3 results) No results for input(s): PROBNP in the last 8760 hours. HbA1C: No results for input(s): HGBA1C in the last 72 hours. CBG: No  results for input(s): GLUCAP in the last 168 hours. Lipid Profile: No results for input(s): CHOL, HDL, LDLCALC, TRIG, CHOLHDL, LDLDIRECT in the last 72 hours. Thyroid Function Tests: No results for input(s): TSH, T4TOTAL, FREET4, T3FREE, THYROIDAB in the last 72 hours. Anemia Panel: No results for input(s): VITAMINB12, FOLATE, FERRITIN, TIBC, IRON, RETICCTPCT in the last 72 hours. Urine analysis:    Component Value Date/Time   COLORURINE YELLOW 07/31/2014 [place] 07/31/2014 0949   LABSPEC 1.010 07/31/2014 0949   PHURINE 7.5 07/31/2014 0949   GLUCOSEU NEGATIVE 07/31/2014 0949   HGBUR NEGATIVE 07/31/2014 0949   BILIRUBINUR NEGATIVE 07/31/2014 0949   KETONESUR NEGATIVE 07/31/2014 0949   PROTEINUR NEGATIVE 07/31/2014 0949   UROBILINOGEN 0.2 07/31/2014 0949   NITRITE NEGATIVE 07/31/2014 0949   LEUKOCYTESUR SMALL (A) 07/31/2014 0949   Sepsis Labs: @LABRCNTIP (procalcitonin:4,lacticidven:4)  ) Recent Results (from the past 240 hour(s))  MRSA PCR Screening     Status: None   Collection Time: 08/25/17  7:55 PM  Result Value Ref Range Status   MRSA by PCR NEGATIVE NEGATIVE Final    Comment:        The GeneXpert MRSA Assay (FDA approved for NASAL specimens only), is one component of a comprehensive MRSA colonization surveillance program. It is not intended to diagnose MRSA infection nor to guide or monitor treatment for MRSA infections.       Studies: No results found.  Scheduled Meds: . aspirin  325 mg Oral Daily  . B-complex with vitamin C  1 tablet Oral Daily  . carvedilol  3.125 mg Oral BID WC  . cholecalciferol  1,000 Units Oral Daily  . enoxaparin (LOVENOX) injection  40 mg Subcutaneous Q24H  . furosemide  20 mg Oral q morning - 10a  . levothyroxine  75 mcg Oral QAC breakfast  . lisinopril  5 mg Oral Daily  . sodium chloride flush  3 mL Intravenous Q12H  . spironolactone  25 mg Oral q morning - 10a  . vitamin B-12  1,000 mcg Oral Daily  .  zolpidem  5 mg Oral Once    Continuous Infusions: . sodium chloride       LOS: 3 days     Bonnell Public, MD [facility] Pager [phone]  If 7PM-7AM, please contact night-coverage [email or web address] Password [email or web address] 08/28/2017, 1:13 PM

## 2017-08-28 NOTE — Progress Notes (Signed)
EKG completed per order. Results not in Epic yet but paper EKG place in shadow chart. Shows sinus rhythm with first degree AV block.

## 2017-08-29 ENCOUNTER — Encounter (HOSPITAL_COMMUNITY)
Admission: EM | Disposition: A | Discharge status: Home Health | Payer: Self-pay | Source: Home / Self Care | Attending: Internal Medicine

## 2017-08-29 HISTORY — PX: LEFT HEART CATH AND CORONARY ANGIOGRAPHY: CATH118249

## 2017-08-29 LAB — BASIC METABOLIC PANEL
Anion gap: 6 (ref 5–15)
BUN: 21 mg/dL — AB (ref 6–20)
CHLORIDE: 105 mmol/L (ref 101–111)
CO2: 26 mmol/L (ref 22–32)
Calcium: 9.1 mg/dL (ref 8.9–10.3)
Creatinine, Ser: 0.92 mg/dL (ref 0.44–1.00)
GFR calc Af Amer: >60 mL/min (ref >60)
GFR calc non Af Amer: 53 mL/min — ABNORMAL LOW (ref >60)
GLUCOSE: 87 mg/dL (ref 65–99)
Potassium: 4.2 mmol/L (ref 3.5–5.1)
Sodium: 137 mmol/L (ref 135–145)

## 2017-08-29 SURGERY — LEFT HEART CATH AND CORONARY ANGIOGRAPHY
Anesthesia: LOCAL

## 2017-08-29 MED ORDER — IOPAMIDOL (ISOVUE-370) INJECTION 76%
INTRAVENOUS | Status: DC | PRN
  Administered 2017-08-29: 40 mL via INTRA_ARTERIAL

## 2017-08-29 MED ORDER — SODIUM CHLORIDE 0.9 % IV SOLN
INTRAVENOUS | Status: AC
  Administered 2017-08-29: 20:00:00 via INTRAVENOUS

## 2017-08-29 MED ORDER — SODIUM CHLORIDE 0.9% FLUSH: 3.0000 mL | INTRAVENOUS | Status: DC | PRN

## 2017-08-29 MED ORDER — SODIUM CHLORIDE 0.9% FLUSH
3.0000 mL | Freq: Two times a day (BID) | INTRAVENOUS | Status: DC
  Administered 2017-08-29 – 2017-08-30 (×2): 3 mL via INTRAVENOUS

## 2017-08-29 MED ORDER — FENTANYL CITRATE (PF) 100 MCG/2ML IJ SOLN
INTRAMUSCULAR | Status: AC
  Filled 2017-08-29: qty 2

## 2017-08-29 MED ORDER — CARVEDILOL 6.25 MG PO TABS: 6.2500 mg | ORAL_TABLET | Freq: Two times a day (BID) | ORAL | Status: DC

## 2017-08-29 MED ORDER — SODIUM CHLORIDE 0.9 % IV SOLN
INTRAVENOUS | Status: DC
  Administered 2017-08-29: 15:00:00 via INTRAVENOUS

## 2017-08-29 MED ORDER — CARVEDILOL 6.25 MG PO TABS
6.2500 mg | ORAL_TABLET | Freq: Two times a day (BID) | ORAL | Status: DC
  Administered 2017-08-30: 6.25 mg via ORAL
  Filled 2017-08-29: qty 1

## 2017-08-29 MED ORDER — HEPARIN (PORCINE) IN NACL 2-0.9 UNIT/ML-% IJ SOLN
INTRAMUSCULAR | Status: AC
  Filled 2017-08-29: qty 1000

## 2017-08-29 MED ORDER — MIDAZOLAM HCL 2 MG/2ML IJ SOLN
INTRAMUSCULAR | Status: AC
  Filled 2017-08-29: qty 2

## 2017-08-29 MED ORDER — LIDOCAINE HCL (PF) 1 % IJ SOLN
INTRAMUSCULAR | Status: DC | PRN
  Administered 2017-08-29: 18 mL via INTRADERMAL

## 2017-08-29 MED ORDER — HEPARIN (PORCINE) IN NACL 2-0.9 UNIT/ML-% IJ SOLN
INTRAMUSCULAR | Status: AC | PRN
  Administered 2017-08-29: 1000 mL

## 2017-08-29 MED ORDER — FENTANYL CITRATE (PF) 100 MCG/2ML IJ SOLN
INTRAMUSCULAR | Status: DC | PRN
  Administered 2017-08-29: 25 ug via INTRAVENOUS

## 2017-08-29 MED ORDER — MIDAZOLAM HCL 2 MG/2ML IJ SOLN
INTRAMUSCULAR | Status: DC | PRN
  Administered 2017-08-29: 1 mg via INTRAVENOUS

## 2017-08-29 MED ORDER — SODIUM CHLORIDE 0.9% FLUSH: 3.0000 mL | Freq: Two times a day (BID) | INTRAVENOUS | Status: DC

## 2017-08-29 MED ORDER — LIDOCAINE HCL (PF) 1 % IJ SOLN
INTRAMUSCULAR | Status: AC
  Filled 2017-08-29: qty 30

## 2017-08-29 MED ORDER — IOPAMIDOL (ISOVUE-370) INJECTION 76%
INTRAVENOUS | Status: AC
  Filled 2017-08-29: qty 100

## 2017-08-29 MED ORDER — SODIUM CHLORIDE 0.9 % IV SOLN: 250.0000 mL | INTRAVENOUS | Status: DC | PRN

## 2017-08-29 SURGICAL SUPPLY — 9 items
CATH INFINITI 5FR MPB2 (CATHETERS) ×2 IMPLANT
CATH INFINITI 5FR MULTPACK ANG (CATHETERS) IMPLANT
HOVERMATT SINGLE USE (MISCELLANEOUS) ×2 IMPLANT
KIT HEART LEFT (KITS) ×2 IMPLANT
KIT MICROINTRODUCER STIFF 5F (SHEATH) ×2 IMPLANT
PACK CARDIAC CATHETERIZATION (CUSTOM PROCEDURE TRAY) ×2 IMPLANT
SHEATH PINNACLE 5F 10CM (SHEATH) ×2 IMPLANT
TRANSDUCER W/STOPCOCK (MISCELLANEOUS) ×2 IMPLANT
WIRE EMERALD 3MM-J .035X150CM (WIRE) ×2 IMPLANT

## 2017-08-29 NOTE — Progress Notes (Signed)
Physical Therapy Treatment Patient Details Name: Madison Marquez MRN: [id number] DOB: February 27, 1924 Today's Date: 08/29/2017    History of Present Illness 84 yo female c/o worsening dyspnea on exertion, PND. DX with acute CHF. PMH HTN, hypothyroidism, hx L carpal tunnel release, hernia repair    PT Comments    Pt was seen for evaluation of mobility today, moving better with some fatigue and not letting this stop her.  Son is there to offer information and support with pt participation. Will follow acutely for strength and endurance with walker at home and will monitor her LUE for discomfort as she progresses distances, although pt expects to DC home today.   Follow Up Recommendations  Home health PT     Equipment Recommendations  3in1 (PT)    Recommendations for Other Services       Precautions / Restrictions Precautions Precautions: Fall Precaution Comments: L wrist injury following fall out of chair at home  Restrictions Weight Bearing Restrictions: No Other Position/Activity Restrictions: able to use walker wtih light assist on LUE    Mobility  Bed Mobility Overal bed mobility: Modified Independent                Transfers Overall transfer level: Modified independent                  Ambulation/Gait Ambulation/Gait assistance: Supervision Ambulation Distance (Feet): 225 Feet Assistive device: Rolling walker (2 wheeled) Gait Pattern/deviations: Step-through pattern;Decreased stride length;Wide base of support;Trunk flexed Gait velocity: reduced Gait velocity interpretation: Below normal speed for age/gender General Gait Details: rolling walker with LUE casted and slowly paced walk   Stairs            Wheelchair Mobility    Modified Rankin (Stroke Patients Only)       Balance Overall balance assessment: Needs assistance Sitting-balance support: Feet supported Sitting balance-Leahy Scale: Good     Standing balance support: Bilateral upper  extremity supported;During functional activity Standing balance-Leahy Scale: Fair                              Cognition Arousal/Alertness: Awake/alert Behavior During Therapy: WFL for tasks assessed/performed Overall Cognitive Status: Within Functional Limits for tasks assessed                                        Exercises      General Comments        Pertinent Vitals/Pain Pain Assessment: No/denies pain    Home Living                      Prior Function            PT Goals (current goals can now be found in the care plan section) Progress towards PT goals: Progressing toward goals    Frequency    Min 3X/week      PT Plan Current plan remains appropriate    Co-evaluation              AM-PAC PT "6 Clicks" Daily Activity  Outcome Measure  Difficulty turning over in bed (including adjusting bedclothes, sheets and blankets)?: None Difficulty moving from lying on back to sitting on the side of the bed? : None Difficulty sitting down on and standing up from a chair with arms (e.g., wheelchair, bedside commode, etc,.)?:  None Help needed moving to and from a bed to chair (including a wheelchair)?: A Little Help needed walking in hospital room?: A Little Help needed climbing 3-5 steps with a railing? : A Little 6 Click Score: 21    End of Session   Activity Tolerance: Patient tolerated treatment well Patient left: with call bell/phone within reach;in chair;with family/visitor present Nurse Communication: Mobility status PT Visit Diagnosis: Unsteadiness on feet (R26.81);Muscle weakness (generalized) (M62.81);Difficulty in walking, not elsewhere classified (R26.2);History of falling (Z91.81)     Time: 5643-3295 PT Time Calculation (min) (ACUTE ONLY): 22 min  Charges:  $Gait Training: 8-22 mins                    G Codes:        Ramond Dial 09/20/17, 3:38 PM   Mee Hives, PT MS Acute Rehab Dept. Number:  [place] [facility] [place]

## 2017-08-29 NOTE — Progress Notes (Signed)
RN was not able to set up education channel for patient to watch video.  Access to educational videos phone kept ringing with no response after calling. Marcille Blanco, RN

## 2017-08-29 NOTE — Progress Notes (Signed)
Heart Failure Navigator Consult Note  Presentation: Madison Marquez is a 84 y.o.year old femalewith medical history significant for hypertension, hypothyroidism who presented on 12/31/2018with progressive dyspnea on exertion, paroxysmal nocturnal dyspnea lower leg swelling for several weeks.  On further questioning, she states that over the past 1 year she has noticed gradual onset of worsening dyspnea and also marked fatigue.  2 days prior to the presentation, she could not even do minimal activities around the house and hence presented to the ED.  She was found to be in congestive heart failure.  I now been consulted for evaluation and management of heart failure with decreased LVEF.  Patient states that since admission to the hospital, her symptoms of dyspnea has improved.  She has noticed marked generalized weakness since admission to the hospital.  She has not had any chest pain until this afternoon, went for a walk in the hallway and then when she laid down she has been complaining of upper abdominal discomfort that has been constant.  Thinks that she has had a heart attack.  Her daughter is present at the bedside.  Her symptoms of PND and orthopnea has resolved, leg edema is also improved.  She denies any fever, chills, hemoptysis, dizziness or syncope.  Although 84 years of age, lives in assisted living facility but is fairly independent.  Past Medical History:  Diagnosis Date  . Acute CHF (congestive heart failure) ([place]) 08/25/2017  . Arthritis    "probably in my right knee" (08/25/2017)  . [facility] Patient infection [phone]  . Hypercholesterolemia   . Hypothyroid   . Migraine    "I've had 1 in my lifetime" (08/25/2017)    Social History   Socioeconomic History  . Marital status: Widowed    Spouse name: None  . Number of children: None  . Years of education: None  . Highest education level: None  Social Needs  . Financial resource strain: None  . Food insecurity - worry: None  .  Food insecurity - inability: None  . Transportation needs - medical: None  . Transportation needs - non-medical: None  Occupational History  . None  Tobacco Use  . Smoking status: Never Smoker  . Smokeless tobacco: Never Used  Substance and Sexual Activity  . Alcohol use: No  . Drug use: No  . Sexual activity: None  Other Topics Concern  . None  Social History Narrative  . None    ECHO:Study Conclusions  - Left ventricle: The cavity size was normal. There was moderate   concentric hypertrophy. Systolic function was moderately reduced.   The estimated ejection fraction was in the range of 35% to 40%.   Diffuse hypokinesis. Features are consistent with a pseudonormal   left ventricular filling pattern, with concomitant abnormal   relaxation and increased filling pressure (grade 2 diastolic   dysfunction). Doppler parameters are consistent with elevated   ventricular end-diastolic filling pressure. - Aortic valve: Trileaflet; moderately thickened, severely   calcified leaflets. Transvalvular velocity was minimally   increased. There was no stenosis. There was moderate   regurgitation. - Mitral valve: There was no regurgitation. - Right ventricle: The cavity size was normal. Wall thickness was   normal. Systolic function was normal. - Right atrium: The atrium was normal in size. - Tricuspid valve: There was mild regurgitation. - Pulmonic valve: There was trivial regurgitation. - Pulmonary arteries: Systolic pressure was mildly increased. PA   peak pressure: 32 mm Hg (S). - Inferior vena cava: The vessel was  normal in size. - Pericardium, extracardiac: There was no pericardial effusion.   There was a left pleural effusion.   BNP    Component Value Date/Time   BNP 257.1 (H) 08/25/2017 0810    ProBNP No results found for: PROBNP   Education Assessment and Provision:  Detailed education and instructions provided on heart failure disease management including the  following:  Signs and symptoms of Heart Failure When to call the physician Importance of daily weights Low sodium diet Fluid restriction Medication management Anticipated future follow-up appointments  Patient education given on each of the above topics.  Patient acknowledges understanding and acceptance of all instructions. I discussed with Ms. Kershaw and son in law about her HF and current hospitalization.  She tells me that she does not have an outpatient cardiologist.  She has been seen by Dr Einar Gip while here in the hospital and will likely follow-up with his office.  I reviewed the importance of daily weights and when to contact the physician. She has a scale and has been weighing prior to admission.  I reviewed a low sodium diet and high sodium foods to avoid.  She tells me that she "tries" to be careful with her sodium intake.  She is concerned that the meals provided at [employer] seem to contain a great deal of sodium.  Her son in law said that he will attempt to speak to [employer] about offering other low sodium choices.  She denies any issues getting or taking prescribed medications --yet was only taking thyroid medication prior to admit.  She will follow with [facility] as outpatient.  Education Materials:  "Living Better With Heart Failure" Booklet, Daily Weight Tracker Tool    High Risk Criteria for Readmission and/or Poor Patient Outcomes:  (Recommend Follow-up with [facility])   EF <30%- No 35-40% with grade 2 dias dys  2 or more admissions in 6 months-No  Difficult social situation-No  Demonstrates medication noncompliance-denies, however only took one prescribed med prior to adm.   Barriers of Care:  New HF, Knowledge and compliance  Discharge Planning:   Plans to return to [place] with [facility].

## 2017-08-29 NOTE — Progress Notes (Signed)
Subjective:  Complaints of chest tightness every time she walks in the hallway.  Objective:  Vital Signs in the last 24 hours: Temp:  [97.6 F (36.4 C)-97.7 F (36.5 C)] 97.6 F (36.4 C) (01/04 0507) Pulse Rate:  [64-72] 70 (01/04 0943) Resp:  [18] 18 (01/04 0507) BP: (111-124)/(56-72) 113/56 (01/04 0943) SpO2:  [98 %-100 %] 100 % (01/04 0507) Weight:  [74 kg (163 lb 3.2 oz)] 74 kg (163 lb 3.2 oz) (01/04 0559)  Intake/Output from previous day: 01/03 0701 - 01/04 0700 In: 480 [P.O.:480] Out: 1500 [Urine:1500]  Physical Exam:  General appearance: alert, cooperative, appears stated age and no distress Eyes: negative findings: lids and lashes normal Neck: no adenopathy, no carotid bruit, no JVD, supple, symmetrical, trachea midline and thyroid not enlarged, symmetric, no tenderness/mass/nodules Neck: JVP - normal, carotids 2+= without bruits Resp: clear to auscultation bilaterally Chest wall: no tenderness Cardio: S1, S2 normal, no S3 or S4 and systolic murmur: early systolic 2/6, crescendo at 2nd right intercostal space GI: soft, non-tender; bowel sounds normal; no masses,  no organomegaly Extremities: extremities normal, atraumatic, no cyanosis or edema    Lab Results: BMP Recent Labs    08/27/17 0634 08/28/17 0432 08/29/17 0519  NA 138 137 137  K 3.2* 4.1 4.2  CL 103 104 105  CO2 26 25 26   GLUCOSE 94 84 87  BUN 17 17 21*  CREATININE 0.85 0.84 0.92  CALCIUM 9.0 8.8* 9.1  GFRNONAA 58* 59* 53*  GFRAA >60 >60 >60    CBC Recent Labs  Lab 08/25/17 0810  WBC 6.0  RBC 4.46  HGB 13.5  HCT 41.2  PLT 237  MCV 92.4  MCH 30.3  MCHC 32.8  RDW 14.2    HEMOGLOBIN A1C No results found for: HGBA1C, MPG  Cardiac Panel (last 3 results) Recent Labs    08/25/17 2309 08/27/17 1421 08/28/17 0741  TROPONINI 0.05* 0.05* 0.04*    BNP (last 3 results) No results for input(s): PROBNP in the last 8760 hours.  TSH Recent Labs    08/25/17 1142  TSH 3.076     Lipid Panel     Component Value Date/Time   CHOL 169 08/25/2017 1303   TRIG 51 08/25/2017 1303   HDL 61 08/25/2017 1303   CHOLHDL 2.8 08/25/2017 1303   VLDL 10 08/25/2017 1303   LDLCALC 98 08/25/2017 1303     Hepatic Function Panel Recent Labs    04/07/17 1353  PROT 6.1*  ALBUMIN 3.5  AST 22  ALT 12*  ALKPHOS 56  BILITOT 0.7  BILIDIR 0.2  IBILI 0.5    Imaging: No results found.  Cardiac Studies:  Telemetry: 08/28/2017: Brief episodes of atrial tachycardia.  No atrial fibrillation, no NSVT.  Rare PVCs.  EKG 08/25/2017: Normal sinus rhythm at rate of 75 bpm, poor R wave progression, cannot exclude anteroseptal infarct old.  Incomplete LBBB.  Nonspecific T abnormality.  No significant change compared to 04/07/2017.  Echocardiogram 08/26/2017: Normal LV size, moderate LVH, moderately reduced LVEF at 35-40% with diffuse hypokinesis and grade 2 diastolic dysfunction.  Moderately thickened and severely calcified aortic valve without stenosis, moderate aortic regurgitation present.  Mild pulmonary hypertension, PA pressure 32 mmHg.  Left pleural effusion.   Assessment/Plan:  1.  Acute systolic and diastolic heart failure 2.  Cardiomyopathy, suspect may be related to nonischemic cardiomyopathy in view of no regional wall motion abnormality.  May be related to hypertensive heart disease versus idiopathic.However she continues to c/i upper abdominal  and lower chest pain after she walks and hence ACS and CAD is still high on list 3.  Hypertension 4. Stage 3 CKD due to hypertension  Rec: Plan on cardiac cath as patient is having frequent PVCs and also complains of chest pain just walking down the hallway. Will gently hydrate and f/u S. Cr.  Discussed risks, benefits and alternatives of angiogram including but not limited to <1% risk of death, stroke, MI, need for urgent surgical revascularization, renal failure, but not limited to thest. patient is willing to proceed. Patient's  son in law [name] is present.  Adrian Prows, M.D. 08/29/2017, 11:55 AM [facility] Pager: [phone] Office: [phone] If no answer: [phone]

## 2017-08-29 NOTE — Interval H&P Note (Signed)
History and Physical Interval Note:  08/29/2017 5:58 PM  Madison Marquez  has presented today for surgery, with the diagnosis of cp  The various methods of treatment have been discussed with the patient and family. After consideration of risks, benefits and other options for treatment, the patient has consented to  Procedure(s): LEFT HEART CATH AND CORONARY ANGIOGRAPHY (N/A) and possible PCI as a surgical intervention .  The patient's history has been reviewed, patient examined, no change in status, stable for surgery.  I have reviewed the patient's chart and labs.  Questions were answered to the patient's satisfaction.   Cath Lab Visit (complete for each Cath Lab visit)  Clinical Evaluation Leading to the Procedure:   ACS: Yes.    Non-ACS:    Anginal Classification: CCS IV  Anti-ischemic medical therapy: Minimal Therapy (1 class of medications)  Non-Invasive Test Results: No non-invasive testing performed  Prior CABG: No previous CABG        Adrian Prows

## 2017-08-29 NOTE — Care Management Important Message (Signed)
Important Message  Patient Details  Name: Madison Marquez MRN: [id number] Date of Birth: 01/11/1924   Medicare Important Message Given:  Yes    [name] [name] 08/29/2017, 1:53 PM

## 2017-08-29 NOTE — H&P (View-Only) (Signed)
Subjective:  Complaints of chest tightness every time she walks in the hallway.  Objective:  Vital Signs in the last 24 hours: Temp:  [97.6 F (36.4 C)-97.7 F (36.5 C)] 97.6 F (36.4 C) (01/04 0507) Pulse Rate:  [64-72] 70 (01/04 0943) Resp:  [18] 18 (01/04 0507) BP: (111-124)/(56-72) 113/56 (01/04 0943) SpO2:  [98 %-100 %] 100 % (01/04 0507) Weight:  [74 kg (163 lb 3.2 oz)] 74 kg (163 lb 3.2 oz) (01/04 0559)  Intake/Output from previous day: 01/03 0701 - 01/04 0700 In: 480 [P.O.:480] Out: 1500 [Urine:1500]  Physical Exam:  General appearance: alert, cooperative, appears stated age and no distress Eyes: negative findings: lids and lashes normal Neck: no adenopathy, no carotid bruit, no JVD, supple, symmetrical, trachea midline and thyroid not enlarged, symmetric, no tenderness/mass/nodules Neck: JVP - normal, carotids 2+= without bruits Resp: clear to auscultation bilaterally Chest wall: no tenderness Cardio: S1, S2 normal, no S3 or S4 and systolic murmur: early systolic 2/6, crescendo at 2nd right intercostal space GI: soft, non-tender; bowel sounds normal; no masses,  no organomegaly Extremities: extremities normal, atraumatic, no cyanosis or edema    Lab Results: BMP Recent Labs    08/27/17 0634 08/28/17 0432 08/29/17 0519  NA 138 137 137  K 3.2* 4.1 4.2  CL 103 104 105  CO2 26 25 26   GLUCOSE 94 84 87  BUN 17 17 21*  CREATININE 0.85 0.84 0.92  CALCIUM 9.0 8.8* 9.1  GFRNONAA 58* 59* 53*  GFRAA >60 >60 >60    CBC Recent Labs  Lab 08/25/17 0810  WBC 6.0  RBC 4.46  HGB 13.5  HCT 41.2  PLT 237  MCV 92.4  MCH 30.3  MCHC 32.8  RDW 14.2    HEMOGLOBIN A1C No results found for: HGBA1C, MPG  Cardiac Panel (last 3 results) Recent Labs    08/25/17 2309 08/27/17 1421 08/28/17 0741  TROPONINI 0.05* 0.05* 0.04*    BNP (last 3 results) No results for input(s): PROBNP in the last 8760 hours.  TSH Recent Labs    08/25/17 1142  TSH 3.076     Lipid Panel     Component Value Date/Time   CHOL 169 08/25/2017 1303   TRIG 51 08/25/2017 1303   HDL 61 08/25/2017 1303   CHOLHDL 2.8 08/25/2017 1303   VLDL 10 08/25/2017 1303   LDLCALC 98 08/25/2017 1303     Hepatic Function Panel Recent Labs    04/07/17 1353  PROT 6.1*  ALBUMIN 3.5  AST 22  ALT 12*  ALKPHOS 56  BILITOT 0.7  BILIDIR 0.2  IBILI 0.5    Imaging: No results found.  Cardiac Studies:  Telemetry: 08/28/2017: Brief episodes of atrial tachycardia.  No atrial fibrillation, no NSVT.  Rare PVCs.  EKG 08/25/2017: Normal sinus rhythm at rate of 75 bpm, poor R wave progression, cannot exclude anteroseptal infarct old.  Incomplete LBBB.  Nonspecific T abnormality.  No significant change compared to 04/07/2017.  Echocardiogram 08/26/2017: Normal LV size, moderate LVH, moderately reduced LVEF at 35-40% with diffuse hypokinesis and grade 2 diastolic dysfunction.  Moderately thickened and severely calcified aortic valve without stenosis, moderate aortic regurgitation present.  Mild pulmonary hypertension, PA pressure 32 mmHg.  Left pleural effusion.   Assessment/Plan:  1.  Acute systolic and diastolic heart failure 2.  Cardiomyopathy, suspect may be related to nonischemic cardiomyopathy in view of no regional wall motion abnormality.  May be related to hypertensive heart disease versus idiopathic.However she continues to c/i upper abdominal  and lower chest pain after she walks and hence ACS and CAD is still high on list 3.  Hypertension 4. Stage 3 CKD due to hypertension  Rec: Plan on cardiac cath as patient is having frequent PVCs and also complains of chest pain just walking down the hallway. Will gently hydrate and f/u S. Cr.  Discussed risks, benefits and alternatives of angiogram including but not limited to <1% risk of death, stroke, MI, need for urgent surgical revascularization, renal failure, but not limited to thest. patient is willing to proceed. Patient's  son in law [name] is present.  Adrian Prows, M.D. 08/29/2017, 11:55 AM [facility] Pager: [phone] Office: [phone] If no answer: [phone]

## 2017-08-29 NOTE — Progress Notes (Signed)
PROGRESS NOTE  XOCHIL SHANKER QQV:[id number] DOB: 09/05/1923 DOA: 08/25/2017 PCP: Lajean Manes, MD  Summary:  Madison Marquez is a 84 y.o. year old female with medical history significant for hypertension, hyperlipidemia, hypothyroidism who presented on 08/25/2017 with progressive dyspnea on exertion, paroxysmal nocturnal dyspnea lower leg swelling for several weeks.  On presentation, patient was found to have elevated BNP. Chest x-ray showed small bilateral pleural effusions, TSH unremarkable, troponin slightly elevated 0.04. ECHO revealed EF of 33-29% with diastolic dysfunction. Cardiology input is appreciated. For likely cardiac catheterization prior to discharge as per Cardiology.  No new complaints. Seen alongside patient's Son in law. No chest pain or SOB.  Assessment/Plan: Principal Problem: - Acute CHF, combined systolic and diastolic - On Lisinopril, Coreg, Aldactone and oral Lasix. For likely cardiac catheterization. Will monitor renal function and electrolytes closely, as well as BP. Weigh patient daily. Follow PT Eval. Likely DC after cardiac catheterization.  Active Problems: - Hypokalemia - Resolved   - Hypercholesterolemia - Optimize - Hypothyroid - TSH within normal range (3.076)    Code Status: DNR   Family Communication: Family at bedside   Disposition Plan: Cardiology consulted.   Consultants:  None  Procedures:  TTE   Antimicrobials:  None  Cultures:  None  DVT prophylaxis: Lovenox   Objective: Vitals:   08/29/17 0507 08/29/17 0559 08/29/17 0943 08/29/17 1222  BP: 124/63  (!) 113/56 (!) 106/49  Pulse: 64  70 63  Resp: 18   18  Temp: 97.6 F (36.4 C)   97.7 F (36.5 C)  TempSrc: Oral   Oral  SpO2: 100%   98%  Weight:  74 kg (163 lb 3.2 oz)    Height:        Intake/Output Summary (Last 24 hours) at 08/29/2017 1722 Last data filed at 08/29/2017 0852 Gross per 24 hour  Intake 240 ml  Output 900 ml  Net -660 ml   Filed Weights   08/27/17 0513 08/28/17 0453 08/29/17 0559  Weight: 73.3 kg (161 lb 11.2 oz) 73.9 kg (163 lb) 74 kg (163 lb 3.2 oz)    Exam:  General: Not in distress. Awake and alert. Eyes: anicteric, EOMI ENT: Oral Mucosa clear ,moist. Neck: No appreciable JVD Cardiovascular: S1S2 Respiratory: Clear bilaterally Abdomen: soft, non-distended, non-tender, normal bowel sounds, no guarding or rebound tenderness Extremities- Very minimal pedal edema. Neurologic: Grossly no focal neuro deficit.Mental status AAOx3, speech normal,  Data Reviewed: CBC: Recent Labs  Lab 08/25/17 0810  WBC 6.0  HGB 13.5  HCT 41.2  MCV 92.4  PLT 518   Basic Metabolic Panel: Recent Labs  Lab 08/25/17 0810 08/25/17 1142 08/26/17 0250 08/27/17 0634 08/28/17 0432 08/29/17 0519  NA 141  --  139 138 137 137  K 4.0  --  3.4* 3.2* 4.1 4.2  CL 110  --  102 103 104 105  CO2 24  --  27 26 25 26   GLUCOSE 88  --  91 94 84 87  BUN 14  --  15 17 17  21*  CREATININE 0.89  --  0.90 0.85 0.84 0.92  CALCIUM 8.9  --  8.7* 9.0 8.8* 9.1  MG 2.0 2.0  --   --   --   --    GFR: Estimated Creatinine Clearance: 39.2 mL/min (by C-G formula based on SCr of 0.92 mg/dL). Liver Function Tests: No results for input(s): AST, ALT, ALKPHOS, BILITOT, PROT, ALBUMIN in the last 168 hours. No results for input(s): LIPASE, AMYLASE in  the last 168 hours. No results for input(s): AMMONIA in the last 168 hours. Coagulation Profile: No results for input(s): INR, PROTIME in the last 168 hours. Cardiac Enzymes: Recent Labs  Lab 08/25/17 1142 08/25/17 1713 08/25/17 2309 08/27/17 1421 08/28/17 0741  TROPONINI 0.04* 0.05* 0.05* 0.05* 0.04*   BNP (last 3 results) No results for input(s): PROBNP in the last 8760 hours. HbA1C: No results for input(s): HGBA1C in the last 72 hours. CBG: No results for input(s): GLUCAP in the last 168 hours. Lipid Profile: No results for input(s): CHOL, HDL, LDLCALC, TRIG, CHOLHDL, LDLDIRECT in the last 72  hours. Thyroid Function Tests: No results for input(s): TSH, T4TOTAL, FREET4, T3FREE, THYROIDAB in the last 72 hours. Anemia Panel: No results for input(s): VITAMINB12, FOLATE, FERRITIN, TIBC, IRON, RETICCTPCT in the last 72 hours. Urine analysis:    Component Value Date/Time   COLORURINE YELLOW 07/31/2014 [place] 07/31/2014 0949   LABSPEC 1.010 07/31/2014 0949   PHURINE 7.5 07/31/2014 0949   GLUCOSEU NEGATIVE 07/31/2014 0949   HGBUR NEGATIVE 07/31/2014 0949   BILIRUBINUR NEGATIVE 07/31/2014 0949   KETONESUR NEGATIVE 07/31/2014 0949   PROTEINUR NEGATIVE 07/31/2014 0949   UROBILINOGEN 0.2 07/31/2014 0949   NITRITE NEGATIVE 07/31/2014 0949   LEUKOCYTESUR SMALL (A) 07/31/2014 0949   Sepsis Labs: @LABRCNTIP (procalcitonin:4,lacticidven:4)  ) Recent Results (from the past 240 hour(s))  MRSA PCR Screening     Status: None   Collection Time: 08/25/17  7:55 PM  Result Value Ref Range Status   MRSA by PCR NEGATIVE NEGATIVE Final    Comment:        The GeneXpert MRSA Assay (FDA approved for NASAL specimens only), is one component of a comprehensive MRSA colonization surveillance program. It is not intended to diagnose MRSA infection nor to guide or monitor treatment for MRSA infections.       Studies: No results found.  Scheduled Meds: . aspirin  325 mg Oral Daily  . B-complex with vitamin C  1 tablet Oral Daily  . carvedilol  3.125 mg Oral BID WC  . cholecalciferol  1,000 Units Oral Daily  . enoxaparin (LOVENOX) injection  40 mg Subcutaneous Q24H  . furosemide  20 mg Oral q morning - 10a  . levothyroxine  75 mcg Oral QAC breakfast  . lisinopril  5 mg Oral Daily  . sodium chloride flush  3 mL Intravenous Q12H  . sodium chloride flush  3 mL Intravenous Q12H  . spironolactone  25 mg Oral q morning - 10a  . vitamin B-12  1,000 mcg Oral Daily  . zolpidem  5 mg Oral Once    Continuous Infusions: . sodium chloride    . sodium chloride    . sodium  chloride 100 mL/hr at 08/29/17 1512     LOS: 4 days     Bonnell Public, MD [facility] Pager [phone]  If 7PM-7AM, please contact night-coverage [email or web address] Password [email or web address] 08/29/2017, 5:22 PM

## 2017-08-29 NOTE — Progress Notes (Signed)
Unable to start the 2nd PIV, tried 2x. Primary RN aware.

## 2017-08-30 LAB — BASIC METABOLIC PANEL
Anion gap: 8 (ref 5–15)
BUN: 21 mg/dL — ABNORMAL HIGH (ref 6–20)
CO2: 25 mmol/L (ref 22–32)
Calcium: 8.7 mg/dL — ABNORMAL LOW (ref 8.9–10.3)
Chloride: 104 mmol/L (ref 101–111)
Creatinine, Ser: 0.8 mg/dL (ref 0.44–1.00)
GFR calc Af Amer: >60 mL/min (ref >60)
GLUCOSE: 85 mg/dL (ref 65–99)
POTASSIUM: 3.9 mmol/L (ref 3.5–5.1)
Sodium: 137 mmol/L (ref 135–145)

## 2017-08-30 MED ORDER — FUROSEMIDE 20 MG PO TABS: 20.0000 mg | ORAL_TABLET | Freq: Every morning | ORAL | 0 refills | Status: DC

## 2017-08-30 MED ORDER — LISINOPRIL 5 MG PO TABS: 5.0000 mg | ORAL_TABLET | Freq: Every day | ORAL | 0 refills | Status: DC

## 2017-08-30 MED ORDER — SPIRONOLACTONE 25 MG PO TABS: 25.0000 mg | ORAL_TABLET | Freq: Every morning | ORAL | 0 refills | Status: DC

## 2017-08-30 MED ORDER — CARVEDILOL 6.25 MG PO TABS: 6.2500 mg | ORAL_TABLET | Freq: Two times a day (BID) | ORAL | 0 refills | Status: DC

## 2017-08-30 NOTE — Progress Notes (Signed)
Subjective:  No further chest pain, but continues to complain of generalized weakness and mild shortness of breath.  Patient's granddaughter present at the bedside.  Objective:  Vital Signs in the last 24 hours: Temp:  [97.7 F (36.5 C)-97.9 F (36.6 C)] 97.9 F (36.6 C) (01/05 0630) Pulse Rate:  [59-111] 65 (01/05 0819) Resp:  [13-26] 18 (01/05 0739) BP: (96-142)/(43-73) 111/45 (01/05 0739) SpO2:  [94 %-100 %] 96 % (01/05 0630) Weight:  [74.9 kg (165 lb 3.2 oz)] 74.9 kg (165 lb 3.2 oz) (01/05 0630)  Intake/Output from previous day: 01/04 0701 - 01/05 0700 In: 1043.8 [P.O.:720; I.V.:323.8] Out: 100 [Urine:100] Intake/Output from this shift: Total I/O In: 240 [P.O.:240] Out: -   Physical Exam: Blood pressure (!) 111/45, pulse 65, temperature 97.9 F (36.6 C), temperature source Oral, resp. rate 18, height 5\' 4"  (1.626 m), weight 74.9 kg (165 lb 3.2 oz), SpO2 96 %.  General appearance: alert, cooperative, appears stated age and no distress Eyes: negative findings: lids and lashes normal Neck: no adenopathy, no carotid bruit, no JVD, supple, symmetrical, trachea midline and thyroid not enlarged, symmetric, no tenderness/mass/nodules Neck: JVP - normal, carotids 2+= without bruits Resp: clear to auscultation bilaterally Chest wall: no tenderness Cardio: S1, S2 normal, no S3 or S4 and systolic murmur: early systolic 2/6, crescendo at 2nd right intercostal space GI: soft, non-tender; bowel sounds normal; no masses,  no organomegaly Extremities: extremities normal, atraumatic, no cyanosis or edema  Recent Labs    08/29/17 0519 08/30/17 0437  NA 137 137  K 4.2 3.9  CL 105 104  CO2 26 25  GLUCOSE 87 85  BUN 21* 21*  CREATININE 0.92 0.80   Recent Labs    08/27/17 1421 08/28/17 0741  TROPONINI 0.05* 0.04*   Cardiac Studies:  Telemetry: 08/28/2017: Brief episodes of atrial tachycardia.  No atrial fibrillation, no NSVT.  Rare PVCs.  EKG 08/25/2017:Normal sinus  rhythm at rate of 75 bpm, poor R wave progression, cannot exclude anteroseptal infarct old. Incomplete LBBB. Nonspecific T abnormality. No significant change compared to 04/07/2017.  Echocardiogram 08/26/2017:Normal LV size, moderate LVH, moderately reduced LVEF at 35-40% with diffuse hypokinesis and grade 2 diastolic dysfunction. Moderately thickened and severely calcified aortic valve without stenosis, moderate aortic regurgitation present. Mild pulmonary hypertension, PA pressure 32 mmHg. Left pleural effusion.  Scheduled Meds: . B-complex with vitamin C  1 tablet Oral Daily  . carvedilol  6.25 mg Oral BID WC  . cholecalciferol  1,000 Units Oral Daily  . enoxaparin (LOVENOX) injection  40 mg Subcutaneous Q24H  . furosemide  20 mg Oral q morning - 10a  . levothyroxine  75 mcg Oral QAC breakfast  . lisinopril  5 mg Oral Daily  . sodium chloride flush  3 mL Intravenous Q12H  . sodium chloride flush  3 mL Intravenous Q12H  . spironolactone  25 mg Oral q morning - 10a  . vitamin B-12  1,000 mcg Oral Daily  . zolpidem  5 mg Oral Once   Continuous Infusions: . sodium chloride Stopped (08/30/17 0002)  . sodium chloride     PRN Meds:.sodium chloride, sodium chloride, acetaminophen, gi cocktail, nitroGLYCERIN, ondansetron (ZOFRAN) IV, sodium chloride flush, sodium chloride flush   Assessment/Plan:  1. Acute systolic and diastolic heart failure, resolved.  2. Cardiomyopathy, suspect may be related to nonischemic cardiomyopathy in view of no regional wall motion abnormality. Stress cardiomyopathy in differential as EF has normalized by cardiac cath yesterday. 3. Hypertension 4. Stage 3 CKD due to  hypertension  Recommendation: Patient is presently tolerating the medications, I would recommend that we treat her for chronic diastolic heart failure.  Systolic heart failure has resolved.  Would recommend discharge on the present medications, also give her furosemide 20 mg daily to be taken  on a as needed basis if there is greater than 3 pounds of weight gain in 3 days or she develops dyspnea.  I had a very lengthy discussion with the patient and her granddaughter at the bedside regarding heart failure symptoms, how to watch for symptoms and manage her symptoms with as needed use of furosemide.  Low-salt diet was also discussed.  I would like to see her back in 2 weeks for follow-up.    LOS: 5 days   Adrian Prows, MD 08/30/2017, 10:33 AM [facility]. [place] Pager: [phone] Office: [phone] If no answer: Cell:  [phone]

## 2017-08-30 NOTE — Discharge Summary (Signed)
Physician Discharge Summary  Patient ID: Madison Marquez MRN: [id number] DOB/AGE: [birth year] 84 y.o.  Admit date: 08/25/2017 Discharge date: 08/30/2017  Admission Diagnoses:  Discharge Diagnoses:  Principal Problem:   Acute combined systolic diastolic congestive heart failure ([place]) Active Problems:   Hypercholesterolemia   Hypothyroid   Chest pressure   Discharged Condition: stable  Hospital Course: Patient is a 84 year old female with past medical history significant for hypertension, hyperlipidemia, and hypothyroidism. Patient was admitted with progressive dyspnea on exertion, paroxysmal nocturnal dyspnea and lower leg swelling for several weeks. On presentation, patient was found to have elevated BNP. Chest x-ray showed small bilateral pleural effusions, TSH was unremarkable, troponin was slightly elevated 0.04. ECHO revealed EF of 17-00% with diastolic dysfunction. Patient underwent cardiac catheterization that was said to be normal. Patient will be discharged on Lasix, Lisinopril, Coreg and Aldactone. Cardiology team assisted in directing patient's care.  Consults: cardiology  Significant Diagnostic Studies: angiography: Cardiac catheterization was non revealing.  Treatments: see above.  Discharge Exam: Blood pressure (!) 103/54, pulse 65, temperature (!) 97.4 F (36.3 C), temperature source Oral, resp. rate 20, height 5\' 4"  (1.626 m), weight 74.9 kg (165 lb 3.2 oz), SpO2 95 %.   Disposition: 01-Home or Self Care  Discharge Instructions    Call MD for:   Complete by:  As directed    Call MD if symptoms worsen   Diet - low sodium heart healthy   Complete by:  As directed    Increase activity slowly   Complete by:  As directed      Allergies as of 08/30/2017   No Known Allergies     Medication List    STOP taking these medications   HYDROcodone-acetaminophen 5-325 MG tablet Commonly known as:  NORCO     TAKE these medications   aspirin 325 MG tablet Take 325  mg by mouth at bedtime.   B-complex with vitamin C tablet Take 1 tablet by mouth daily.   carvedilol 6.25 MG tablet Commonly known as:  COREG Take 1 tablet (6.25 mg total) by mouth 2 (two) times daily with a meal.   cholecalciferol 1000 units tablet Commonly known as:  VITAMIN D Take 1,000 Units by mouth daily.   furosemide 20 MG tablet Commonly known as:  LASIX Take 1 tablet (20 mg total) by mouth every morning. Start taking on:  08/31/2017   levothyroxine 75 MCG tablet Commonly known as:  SYNTHROID, LEVOTHROID Take 75 mcg by mouth daily before breakfast.   lisinopril 5 MG tablet Commonly known as:  PRINIVIL,ZESTRIL Take 1 tablet (5 mg total) by mouth daily. Start taking on:  08/31/2017   spironolactone 25 MG tablet Commonly known as:  ALDACTONE Take 1 tablet (25 mg total) by mouth every morning. Start taking on:  08/31/2017   vitamin B-12 1000 MCG tablet Commonly known as:  CYANOCOBALAMIN Take 1,000 mcg by mouth daily.      Follow-up Information    [facility] Follow up.   Specialty:  [place] Why:  They will do your home health care at your home Contact information: [address] [place] [address] [phone]           Signed: Bonnell Public 08/30/2017, 1:30 PM

## 2017-08-31 DIAGNOSIS — Z7982 Long term (current) use of aspirin: Secondary | ICD-10-CM | POA: Diagnosis not present

## 2017-08-31 DIAGNOSIS — M1990 Unspecified osteoarthritis, unspecified site: Secondary | ICD-10-CM | POA: Diagnosis not present

## 2017-08-31 DIAGNOSIS — I429 Cardiomyopathy, unspecified: Secondary | ICD-10-CM | POA: Diagnosis not present

## 2017-08-31 DIAGNOSIS — I110 Hypertensive heart disease with heart failure: Secondary | ICD-10-CM | POA: Diagnosis not present

## 2017-08-31 DIAGNOSIS — I5041 Acute combined systolic (congestive) and diastolic (congestive) heart failure: Secondary | ICD-10-CM | POA: Diagnosis not present

## 2017-09-01 ENCOUNTER — Encounter (HOSPITAL_COMMUNITY): Payer: Self-pay | Admitting: Cardiology

## 2017-09-04 DIAGNOSIS — I429 Cardiomyopathy, unspecified: Secondary | ICD-10-CM | POA: Diagnosis not present

## 2017-09-04 DIAGNOSIS — I110 Hypertensive heart disease with heart failure: Secondary | ICD-10-CM | POA: Diagnosis not present

## 2017-09-04 DIAGNOSIS — Z7982 Long term (current) use of aspirin: Secondary | ICD-10-CM | POA: Diagnosis not present

## 2017-09-04 DIAGNOSIS — M1990 Unspecified osteoarthritis, unspecified site: Secondary | ICD-10-CM | POA: Diagnosis not present

## 2017-09-04 DIAGNOSIS — I5041 Acute combined systolic (congestive) and diastolic (congestive) heart failure: Secondary | ICD-10-CM | POA: Diagnosis not present

## 2017-09-09 DIAGNOSIS — I110 Hypertensive heart disease with heart failure: Secondary | ICD-10-CM | POA: Diagnosis not present

## 2017-09-09 DIAGNOSIS — I429 Cardiomyopathy, unspecified: Secondary | ICD-10-CM | POA: Diagnosis not present

## 2017-09-09 DIAGNOSIS — Z7982 Long term (current) use of aspirin: Secondary | ICD-10-CM | POA: Diagnosis not present

## 2017-09-09 DIAGNOSIS — M1990 Unspecified osteoarthritis, unspecified site: Secondary | ICD-10-CM | POA: Diagnosis not present

## 2017-09-09 DIAGNOSIS — I5041 Acute combined systolic (congestive) and diastolic (congestive) heart failure: Secondary | ICD-10-CM | POA: Diagnosis not present

## 2017-09-10 DIAGNOSIS — M1990 Unspecified osteoarthritis, unspecified site: Secondary | ICD-10-CM | POA: Diagnosis not present

## 2017-09-10 DIAGNOSIS — I5041 Acute combined systolic (congestive) and diastolic (congestive) heart failure: Secondary | ICD-10-CM | POA: Diagnosis not present

## 2017-09-10 DIAGNOSIS — I429 Cardiomyopathy, unspecified: Secondary | ICD-10-CM | POA: Diagnosis not present

## 2017-09-10 DIAGNOSIS — I110 Hypertensive heart disease with heart failure: Secondary | ICD-10-CM | POA: Diagnosis not present

## 2017-09-10 DIAGNOSIS — Z7982 Long term (current) use of aspirin: Secondary | ICD-10-CM | POA: Diagnosis not present

## 2017-09-11 DIAGNOSIS — R0609 Other forms of dyspnea: Secondary | ICD-10-CM | POA: Diagnosis not present

## 2017-09-11 DIAGNOSIS — N183 Chronic kidney disease, stage 3 (moderate): Secondary | ICD-10-CM | POA: Diagnosis not present

## 2017-09-11 DIAGNOSIS — I429 Cardiomyopathy, unspecified: Secondary | ICD-10-CM | POA: Diagnosis not present

## 2017-09-11 DIAGNOSIS — I5041 Acute combined systolic (congestive) and diastolic (congestive) heart failure: Secondary | ICD-10-CM | POA: Diagnosis not present

## 2017-09-11 DIAGNOSIS — I5033 Acute on chronic diastolic (congestive) heart failure: Secondary | ICD-10-CM | POA: Diagnosis not present

## 2017-09-11 DIAGNOSIS — I110 Hypertensive heart disease with heart failure: Secondary | ICD-10-CM | POA: Diagnosis not present

## 2017-09-11 DIAGNOSIS — I10 Essential (primary) hypertension: Secondary | ICD-10-CM | POA: Diagnosis not present

## 2017-09-11 DIAGNOSIS — M1990 Unspecified osteoarthritis, unspecified site: Secondary | ICD-10-CM | POA: Diagnosis not present

## 2017-09-11 DIAGNOSIS — Z7982 Long term (current) use of aspirin: Secondary | ICD-10-CM | POA: Diagnosis not present

## 2017-09-12 DIAGNOSIS — I5041 Acute combined systolic (congestive) and diastolic (congestive) heart failure: Secondary | ICD-10-CM | POA: Diagnosis not present

## 2017-09-12 DIAGNOSIS — I110 Hypertensive heart disease with heart failure: Secondary | ICD-10-CM | POA: Diagnosis not present

## 2017-09-12 DIAGNOSIS — I429 Cardiomyopathy, unspecified: Secondary | ICD-10-CM | POA: Diagnosis not present

## 2017-09-12 DIAGNOSIS — M1990 Unspecified osteoarthritis, unspecified site: Secondary | ICD-10-CM | POA: Diagnosis not present

## 2017-09-12 DIAGNOSIS — Z7982 Long term (current) use of aspirin: Secondary | ICD-10-CM | POA: Diagnosis not present

## 2017-09-16 DIAGNOSIS — Z7982 Long term (current) use of aspirin: Secondary | ICD-10-CM | POA: Diagnosis not present

## 2017-09-16 DIAGNOSIS — I429 Cardiomyopathy, unspecified: Secondary | ICD-10-CM | POA: Diagnosis not present

## 2017-09-16 DIAGNOSIS — I110 Hypertensive heart disease with heart failure: Secondary | ICD-10-CM | POA: Diagnosis not present

## 2017-09-16 DIAGNOSIS — M25532 Pain in left wrist: Secondary | ICD-10-CM | POA: Diagnosis not present

## 2017-09-16 DIAGNOSIS — I5041 Acute combined systolic (congestive) and diastolic (congestive) heart failure: Secondary | ICD-10-CM | POA: Diagnosis not present

## 2017-09-16 DIAGNOSIS — M1990 Unspecified osteoarthritis, unspecified site: Secondary | ICD-10-CM | POA: Diagnosis not present

## 2017-09-17 DIAGNOSIS — I110 Hypertensive heart disease with heart failure: Secondary | ICD-10-CM | POA: Diagnosis not present

## 2017-09-17 DIAGNOSIS — I429 Cardiomyopathy, unspecified: Secondary | ICD-10-CM | POA: Diagnosis not present

## 2017-09-17 DIAGNOSIS — Z7982 Long term (current) use of aspirin: Secondary | ICD-10-CM | POA: Diagnosis not present

## 2017-09-17 DIAGNOSIS — M1990 Unspecified osteoarthritis, unspecified site: Secondary | ICD-10-CM | POA: Diagnosis not present

## 2017-09-17 DIAGNOSIS — I5041 Acute combined systolic (congestive) and diastolic (congestive) heart failure: Secondary | ICD-10-CM | POA: Diagnosis not present

## 2017-09-18 DIAGNOSIS — M1990 Unspecified osteoarthritis, unspecified site: Secondary | ICD-10-CM | POA: Diagnosis not present

## 2017-09-18 DIAGNOSIS — I5041 Acute combined systolic (congestive) and diastolic (congestive) heart failure: Secondary | ICD-10-CM | POA: Diagnosis not present

## 2017-09-18 DIAGNOSIS — I110 Hypertensive heart disease with heart failure: Secondary | ICD-10-CM | POA: Diagnosis not present

## 2017-09-18 DIAGNOSIS — I429 Cardiomyopathy, unspecified: Secondary | ICD-10-CM | POA: Diagnosis not present

## 2017-09-18 DIAGNOSIS — Z7982 Long term (current) use of aspirin: Secondary | ICD-10-CM | POA: Diagnosis not present

## 2017-09-19 DIAGNOSIS — I429 Cardiomyopathy, unspecified: Secondary | ICD-10-CM | POA: Diagnosis not present

## 2017-09-19 DIAGNOSIS — I5041 Acute combined systolic (congestive) and diastolic (congestive) heart failure: Secondary | ICD-10-CM | POA: Diagnosis not present

## 2017-09-19 DIAGNOSIS — Z7982 Long term (current) use of aspirin: Secondary | ICD-10-CM | POA: Diagnosis not present

## 2017-09-19 DIAGNOSIS — I110 Hypertensive heart disease with heart failure: Secondary | ICD-10-CM | POA: Diagnosis not present

## 2017-09-19 DIAGNOSIS — M1990 Unspecified osteoarthritis, unspecified site: Secondary | ICD-10-CM | POA: Diagnosis not present

## 2017-09-22 DIAGNOSIS — I5041 Acute combined systolic (congestive) and diastolic (congestive) heart failure: Secondary | ICD-10-CM | POA: Diagnosis not present

## 2017-09-22 DIAGNOSIS — I429 Cardiomyopathy, unspecified: Secondary | ICD-10-CM | POA: Diagnosis not present

## 2017-09-22 DIAGNOSIS — M1990 Unspecified osteoarthritis, unspecified site: Secondary | ICD-10-CM | POA: Diagnosis not present

## 2017-09-22 DIAGNOSIS — I110 Hypertensive heart disease with heart failure: Secondary | ICD-10-CM | POA: Diagnosis not present

## 2017-09-22 DIAGNOSIS — Z7982 Long term (current) use of aspirin: Secondary | ICD-10-CM | POA: Diagnosis not present

## 2017-09-23 DIAGNOSIS — I429 Cardiomyopathy, unspecified: Secondary | ICD-10-CM | POA: Diagnosis not present

## 2017-09-23 DIAGNOSIS — I110 Hypertensive heart disease with heart failure: Secondary | ICD-10-CM | POA: Diagnosis not present

## 2017-09-23 DIAGNOSIS — Z23 Encounter for immunization: Secondary | ICD-10-CM | POA: Diagnosis not present

## 2017-09-23 DIAGNOSIS — I5041 Acute combined systolic (congestive) and diastolic (congestive) heart failure: Secondary | ICD-10-CM | POA: Diagnosis not present

## 2017-09-23 DIAGNOSIS — Z7982 Long term (current) use of aspirin: Secondary | ICD-10-CM | POA: Diagnosis not present

## 2017-09-23 DIAGNOSIS — Z1389 Encounter for screening for other disorder: Secondary | ICD-10-CM | POA: Diagnosis not present

## 2017-09-23 DIAGNOSIS — I5022 Chronic systolic (congestive) heart failure: Secondary | ICD-10-CM | POA: Diagnosis not present

## 2017-09-23 DIAGNOSIS — M1990 Unspecified osteoarthritis, unspecified site: Secondary | ICD-10-CM | POA: Diagnosis not present

## 2017-09-23 DIAGNOSIS — G459 Transient cerebral ischemic attack, unspecified: Secondary | ICD-10-CM | POA: Diagnosis not present

## 2017-09-23 DIAGNOSIS — E039 Hypothyroidism, unspecified: Secondary | ICD-10-CM | POA: Diagnosis not present

## 2017-09-23 DIAGNOSIS — I700 Atherosclerosis of aorta: Secondary | ICD-10-CM | POA: Diagnosis not present

## 2017-09-23 DIAGNOSIS — Z0000 Encounter for general adult medical examination without abnormal findings: Secondary | ICD-10-CM | POA: Diagnosis not present

## 2017-09-24 DIAGNOSIS — I429 Cardiomyopathy, unspecified: Secondary | ICD-10-CM | POA: Diagnosis not present

## 2017-09-24 DIAGNOSIS — Z7982 Long term (current) use of aspirin: Secondary | ICD-10-CM | POA: Diagnosis not present

## 2017-09-24 DIAGNOSIS — I5041 Acute combined systolic (congestive) and diastolic (congestive) heart failure: Secondary | ICD-10-CM | POA: Diagnosis not present

## 2017-09-24 DIAGNOSIS — M1990 Unspecified osteoarthritis, unspecified site: Secondary | ICD-10-CM | POA: Diagnosis not present

## 2017-09-24 DIAGNOSIS — I110 Hypertensive heart disease with heart failure: Secondary | ICD-10-CM | POA: Diagnosis not present

## 2017-09-25 DIAGNOSIS — I110 Hypertensive heart disease with heart failure: Secondary | ICD-10-CM | POA: Diagnosis not present

## 2017-09-25 DIAGNOSIS — Z7982 Long term (current) use of aspirin: Secondary | ICD-10-CM | POA: Diagnosis not present

## 2017-09-25 DIAGNOSIS — I429 Cardiomyopathy, unspecified: Secondary | ICD-10-CM | POA: Diagnosis not present

## 2017-09-25 DIAGNOSIS — M1990 Unspecified osteoarthritis, unspecified site: Secondary | ICD-10-CM | POA: Diagnosis not present

## 2017-09-25 DIAGNOSIS — I5041 Acute combined systolic (congestive) and diastolic (congestive) heart failure: Secondary | ICD-10-CM | POA: Diagnosis not present

## 2017-09-29 DIAGNOSIS — I429 Cardiomyopathy, unspecified: Secondary | ICD-10-CM | POA: Diagnosis not present

## 2017-09-29 DIAGNOSIS — I5041 Acute combined systolic (congestive) and diastolic (congestive) heart failure: Secondary | ICD-10-CM | POA: Diagnosis not present

## 2017-09-29 DIAGNOSIS — I110 Hypertensive heart disease with heart failure: Secondary | ICD-10-CM | POA: Diagnosis not present

## 2017-09-29 DIAGNOSIS — M1990 Unspecified osteoarthritis, unspecified site: Secondary | ICD-10-CM | POA: Diagnosis not present

## 2017-09-29 DIAGNOSIS — Z7982 Long term (current) use of aspirin: Secondary | ICD-10-CM | POA: Diagnosis not present

## 2017-09-30 DIAGNOSIS — I110 Hypertensive heart disease with heart failure: Secondary | ICD-10-CM | POA: Diagnosis not present

## 2017-09-30 DIAGNOSIS — I429 Cardiomyopathy, unspecified: Secondary | ICD-10-CM | POA: Diagnosis not present

## 2017-09-30 DIAGNOSIS — Z7982 Long term (current) use of aspirin: Secondary | ICD-10-CM | POA: Diagnosis not present

## 2017-09-30 DIAGNOSIS — I5041 Acute combined systolic (congestive) and diastolic (congestive) heart failure: Secondary | ICD-10-CM | POA: Diagnosis not present

## 2017-09-30 DIAGNOSIS — M1990 Unspecified osteoarthritis, unspecified site: Secondary | ICD-10-CM | POA: Diagnosis not present

## 2017-10-02 DIAGNOSIS — M1990 Unspecified osteoarthritis, unspecified site: Secondary | ICD-10-CM | POA: Diagnosis not present

## 2017-10-02 DIAGNOSIS — Z7982 Long term (current) use of aspirin: Secondary | ICD-10-CM | POA: Diagnosis not present

## 2017-10-02 DIAGNOSIS — I429 Cardiomyopathy, unspecified: Secondary | ICD-10-CM | POA: Diagnosis not present

## 2017-10-02 DIAGNOSIS — I5041 Acute combined systolic (congestive) and diastolic (congestive) heart failure: Secondary | ICD-10-CM | POA: Diagnosis not present

## 2017-10-02 DIAGNOSIS — I110 Hypertensive heart disease with heart failure: Secondary | ICD-10-CM | POA: Diagnosis not present

## 2017-10-07 DIAGNOSIS — I5041 Acute combined systolic (congestive) and diastolic (congestive) heart failure: Secondary | ICD-10-CM | POA: Diagnosis not present

## 2017-10-07 DIAGNOSIS — I110 Hypertensive heart disease with heart failure: Secondary | ICD-10-CM | POA: Diagnosis not present

## 2017-10-07 DIAGNOSIS — I429 Cardiomyopathy, unspecified: Secondary | ICD-10-CM | POA: Diagnosis not present

## 2017-10-07 DIAGNOSIS — M1990 Unspecified osteoarthritis, unspecified site: Secondary | ICD-10-CM | POA: Diagnosis not present

## 2017-10-07 DIAGNOSIS — Z7982 Long term (current) use of aspirin: Secondary | ICD-10-CM | POA: Diagnosis not present

## 2017-10-08 DIAGNOSIS — Z7982 Long term (current) use of aspirin: Secondary | ICD-10-CM | POA: Diagnosis not present

## 2017-10-08 DIAGNOSIS — I429 Cardiomyopathy, unspecified: Secondary | ICD-10-CM | POA: Diagnosis not present

## 2017-10-08 DIAGNOSIS — I110 Hypertensive heart disease with heart failure: Secondary | ICD-10-CM | POA: Diagnosis not present

## 2017-10-08 DIAGNOSIS — I5041 Acute combined systolic (congestive) and diastolic (congestive) heart failure: Secondary | ICD-10-CM | POA: Diagnosis not present

## 2017-10-08 DIAGNOSIS — M1990 Unspecified osteoarthritis, unspecified site: Secondary | ICD-10-CM | POA: Diagnosis not present

## 2017-10-09 DIAGNOSIS — I429 Cardiomyopathy, unspecified: Secondary | ICD-10-CM | POA: Diagnosis not present

## 2017-10-09 DIAGNOSIS — I5041 Acute combined systolic (congestive) and diastolic (congestive) heart failure: Secondary | ICD-10-CM | POA: Diagnosis not present

## 2017-10-09 DIAGNOSIS — M1990 Unspecified osteoarthritis, unspecified site: Secondary | ICD-10-CM | POA: Diagnosis not present

## 2017-10-09 DIAGNOSIS — I110 Hypertensive heart disease with heart failure: Secondary | ICD-10-CM | POA: Diagnosis not present

## 2017-10-09 DIAGNOSIS — Z7982 Long term (current) use of aspirin: Secondary | ICD-10-CM | POA: Diagnosis not present

## 2017-10-13 DIAGNOSIS — I429 Cardiomyopathy, unspecified: Secondary | ICD-10-CM | POA: Diagnosis not present

## 2017-10-13 DIAGNOSIS — Z7982 Long term (current) use of aspirin: Secondary | ICD-10-CM | POA: Diagnosis not present

## 2017-10-13 DIAGNOSIS — I110 Hypertensive heart disease with heart failure: Secondary | ICD-10-CM | POA: Diagnosis not present

## 2017-10-13 DIAGNOSIS — I5041 Acute combined systolic (congestive) and diastolic (congestive) heart failure: Secondary | ICD-10-CM | POA: Diagnosis not present

## 2017-10-13 DIAGNOSIS — M1990 Unspecified osteoarthritis, unspecified site: Secondary | ICD-10-CM | POA: Diagnosis not present

## 2017-10-14 DIAGNOSIS — Z7982 Long term (current) use of aspirin: Secondary | ICD-10-CM | POA: Diagnosis not present

## 2017-10-14 DIAGNOSIS — I110 Hypertensive heart disease with heart failure: Secondary | ICD-10-CM | POA: Diagnosis not present

## 2017-10-14 DIAGNOSIS — M1990 Unspecified osteoarthritis, unspecified site: Secondary | ICD-10-CM | POA: Diagnosis not present

## 2017-10-14 DIAGNOSIS — I429 Cardiomyopathy, unspecified: Secondary | ICD-10-CM | POA: Diagnosis not present

## 2017-10-14 DIAGNOSIS — I5041 Acute combined systolic (congestive) and diastolic (congestive) heart failure: Secondary | ICD-10-CM | POA: Diagnosis not present

## 2017-10-16 DIAGNOSIS — I5041 Acute combined systolic (congestive) and diastolic (congestive) heart failure: Secondary | ICD-10-CM | POA: Diagnosis not present

## 2017-10-16 DIAGNOSIS — Z7982 Long term (current) use of aspirin: Secondary | ICD-10-CM | POA: Diagnosis not present

## 2017-10-16 DIAGNOSIS — I429 Cardiomyopathy, unspecified: Secondary | ICD-10-CM | POA: Diagnosis not present

## 2017-10-16 DIAGNOSIS — M1990 Unspecified osteoarthritis, unspecified site: Secondary | ICD-10-CM | POA: Diagnosis not present

## 2017-10-16 DIAGNOSIS — I110 Hypertensive heart disease with heart failure: Secondary | ICD-10-CM | POA: Diagnosis not present

## 2017-10-21 DIAGNOSIS — M1990 Unspecified osteoarthritis, unspecified site: Secondary | ICD-10-CM | POA: Diagnosis not present

## 2017-10-21 DIAGNOSIS — I110 Hypertensive heart disease with heart failure: Secondary | ICD-10-CM | POA: Diagnosis not present

## 2017-10-21 DIAGNOSIS — I429 Cardiomyopathy, unspecified: Secondary | ICD-10-CM | POA: Diagnosis not present

## 2017-10-21 DIAGNOSIS — M25532 Pain in left wrist: Secondary | ICD-10-CM | POA: Diagnosis not present

## 2017-10-21 DIAGNOSIS — I5041 Acute combined systolic (congestive) and diastolic (congestive) heart failure: Secondary | ICD-10-CM | POA: Diagnosis not present

## 2017-10-21 DIAGNOSIS — Z7982 Long term (current) use of aspirin: Secondary | ICD-10-CM | POA: Diagnosis not present

## 2017-10-23 DIAGNOSIS — I110 Hypertensive heart disease with heart failure: Secondary | ICD-10-CM | POA: Diagnosis not present

## 2017-10-23 DIAGNOSIS — I429 Cardiomyopathy, unspecified: Secondary | ICD-10-CM | POA: Diagnosis not present

## 2017-10-23 DIAGNOSIS — Z7982 Long term (current) use of aspirin: Secondary | ICD-10-CM | POA: Diagnosis not present

## 2017-10-23 DIAGNOSIS — M1990 Unspecified osteoarthritis, unspecified site: Secondary | ICD-10-CM | POA: Diagnosis not present

## 2017-10-23 DIAGNOSIS — I5041 Acute combined systolic (congestive) and diastolic (congestive) heart failure: Secondary | ICD-10-CM | POA: Diagnosis not present

## 2017-10-27 DIAGNOSIS — I429 Cardiomyopathy, unspecified: Secondary | ICD-10-CM | POA: Diagnosis not present

## 2017-10-27 DIAGNOSIS — I5041 Acute combined systolic (congestive) and diastolic (congestive) heart failure: Secondary | ICD-10-CM | POA: Diagnosis not present

## 2017-10-27 DIAGNOSIS — Z7982 Long term (current) use of aspirin: Secondary | ICD-10-CM | POA: Diagnosis not present

## 2017-10-27 DIAGNOSIS — M1990 Unspecified osteoarthritis, unspecified site: Secondary | ICD-10-CM | POA: Diagnosis not present

## 2017-10-27 DIAGNOSIS — I110 Hypertensive heart disease with heart failure: Secondary | ICD-10-CM | POA: Diagnosis not present

## 2017-10-28 DIAGNOSIS — M1990 Unspecified osteoarthritis, unspecified site: Secondary | ICD-10-CM | POA: Diagnosis not present

## 2017-10-28 DIAGNOSIS — I5041 Acute combined systolic (congestive) and diastolic (congestive) heart failure: Secondary | ICD-10-CM | POA: Diagnosis not present

## 2017-10-28 DIAGNOSIS — I429 Cardiomyopathy, unspecified: Secondary | ICD-10-CM | POA: Diagnosis not present

## 2017-10-28 DIAGNOSIS — I110 Hypertensive heart disease with heart failure: Secondary | ICD-10-CM | POA: Diagnosis not present

## 2017-10-28 DIAGNOSIS — Z7982 Long term (current) use of aspirin: Secondary | ICD-10-CM | POA: Diagnosis not present

## 2017-11-02 IMAGING — DX DG CHEST 2V
2 series · 2 of 2 positions shown · non-contrast
Comparison: 12/26/2015

CLINICAL DATA: Chest pain.  Coughing.  Nausea and vomiting.

EXAM:
CHEST  2 VIEW

[chest pa]
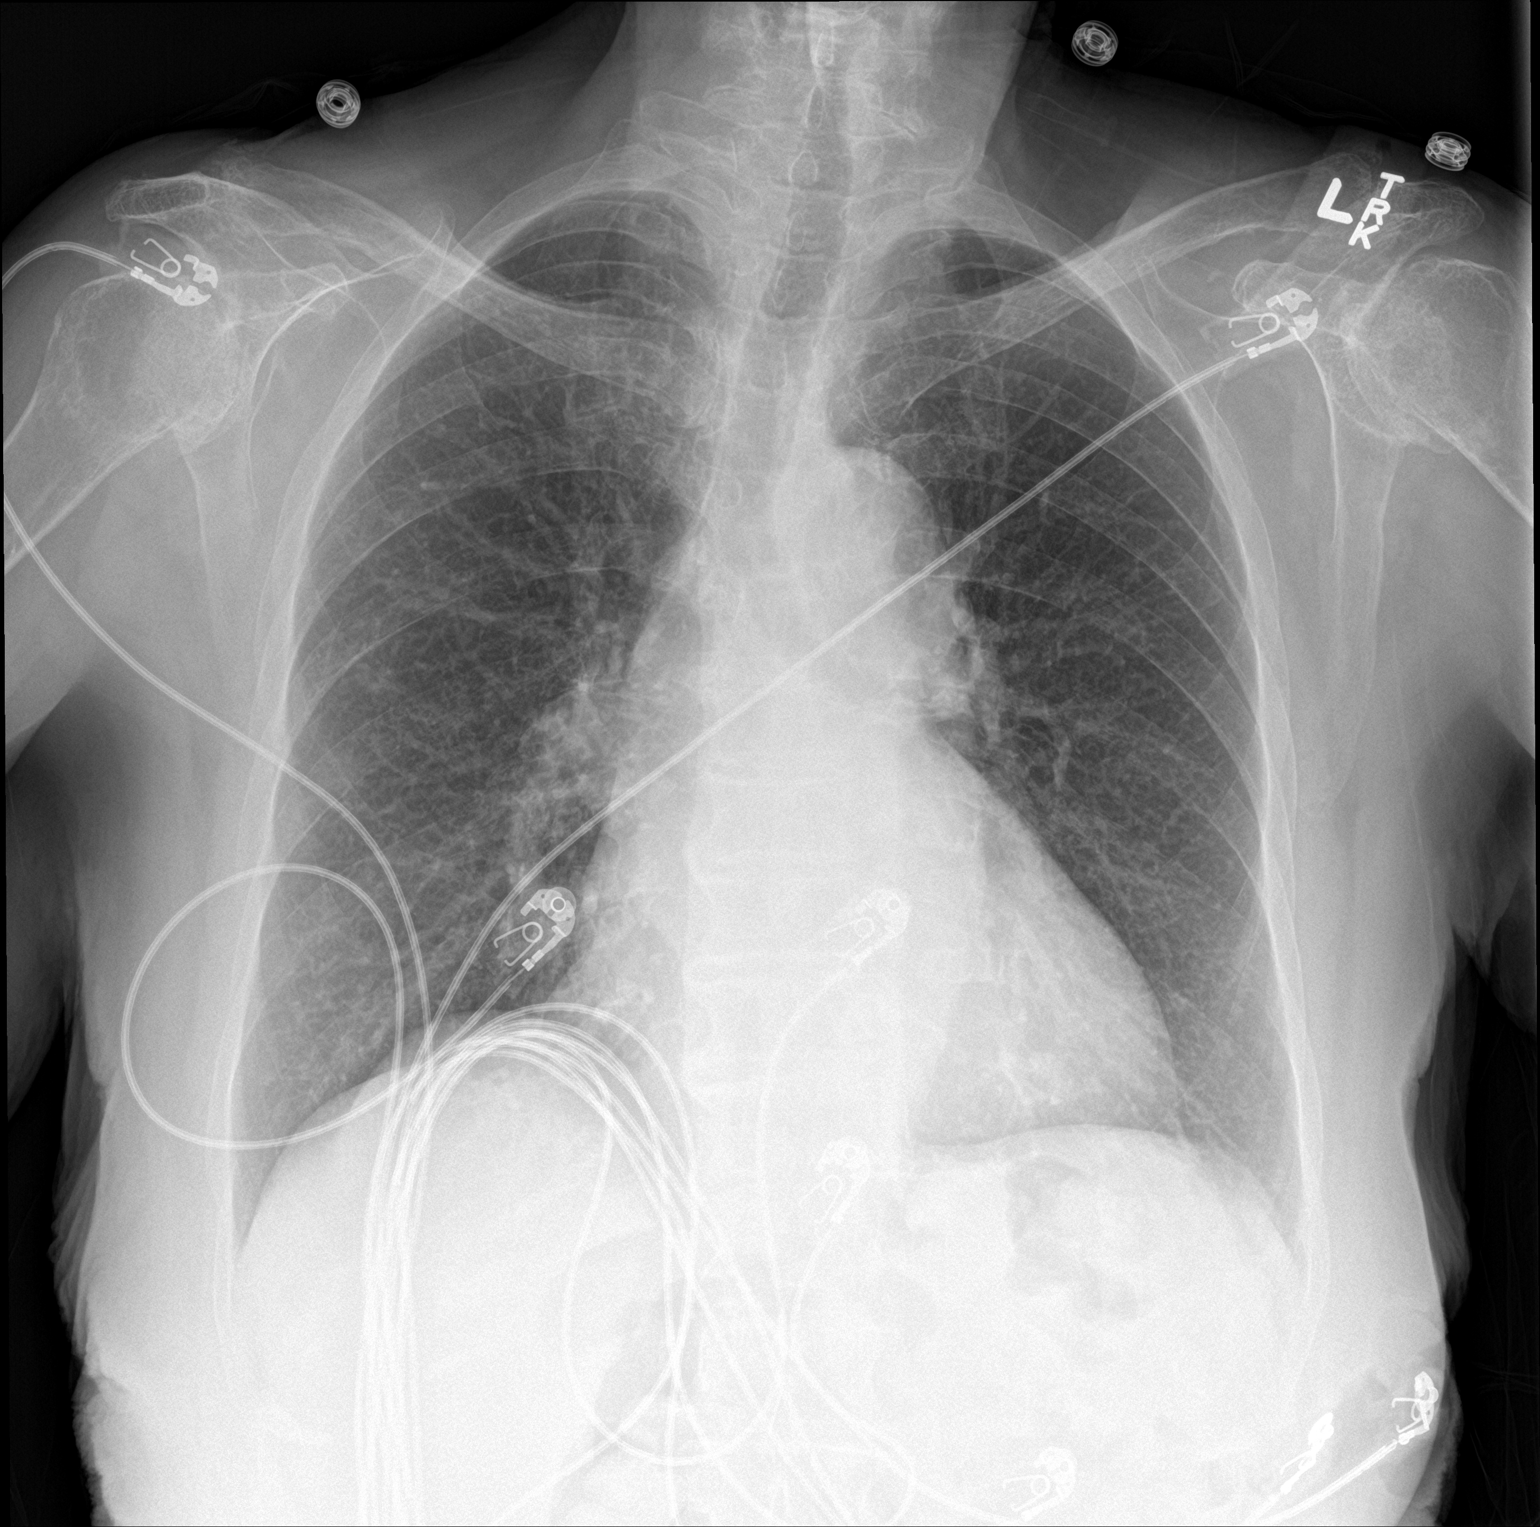

[chest lat]
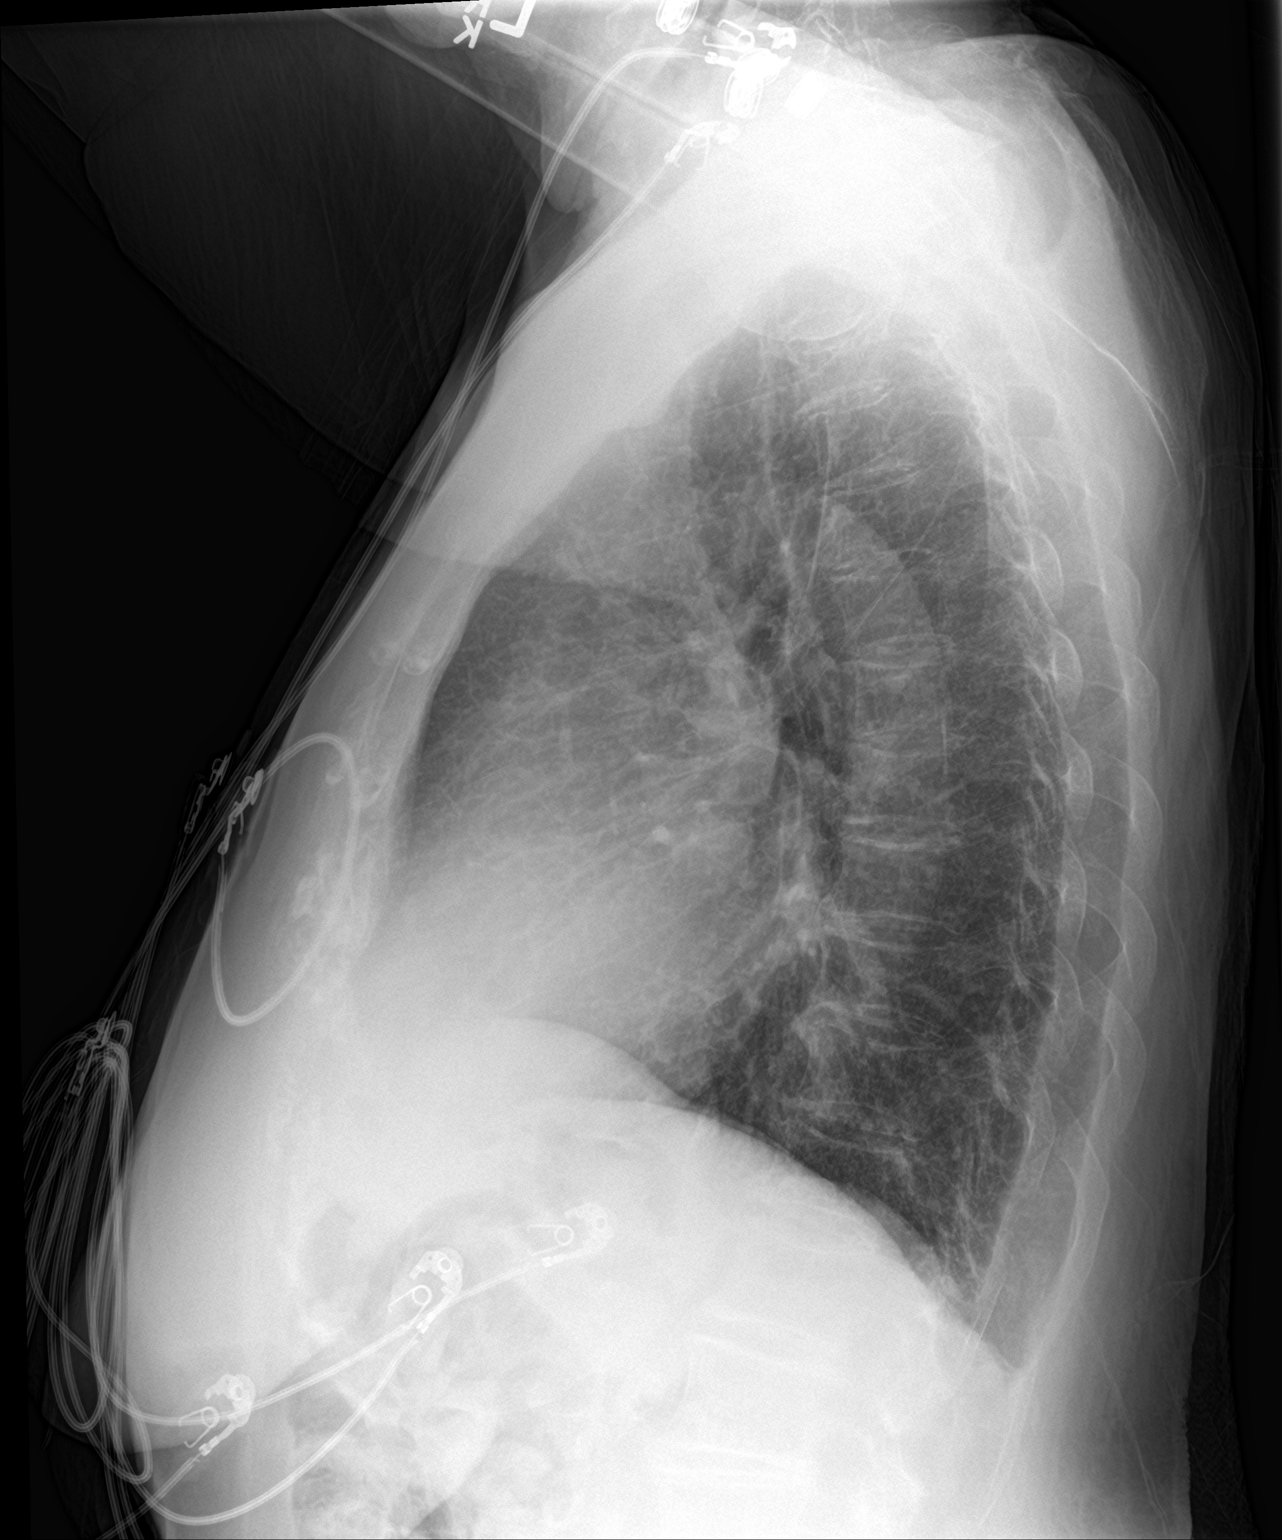

[2 of 2 positions shown; findings below may reference images not displayed]

FINDINGS: Heart size and pulmonary vascularity are normal. Slight tortuosity
and calcification of the thoracic aorta.

The lungs are clear. No effusions. No acute bone abnormality.
Moderate arthritic changes of the glenohumeral joints.
IMPRESSION: No acute abnormalities.

Aortic atherosclerosis.

## 2017-11-04 DIAGNOSIS — R278 Other lack of coordination: Secondary | ICD-10-CM | POA: Diagnosis not present

## 2017-11-04 DIAGNOSIS — M6281 Muscle weakness (generalized): Secondary | ICD-10-CM | POA: Diagnosis not present

## 2017-11-04 DIAGNOSIS — R2681 Unsteadiness on feet: Secondary | ICD-10-CM | POA: Diagnosis not present

## 2017-11-06 DIAGNOSIS — R2681 Unsteadiness on feet: Secondary | ICD-10-CM | POA: Diagnosis not present

## 2017-11-06 DIAGNOSIS — M6281 Muscle weakness (generalized): Secondary | ICD-10-CM | POA: Diagnosis not present

## 2017-11-06 DIAGNOSIS — R278 Other lack of coordination: Secondary | ICD-10-CM | POA: Diagnosis not present

## 2017-11-07 DIAGNOSIS — R278 Other lack of coordination: Secondary | ICD-10-CM | POA: Diagnosis not present

## 2017-11-07 DIAGNOSIS — R2681 Unsteadiness on feet: Secondary | ICD-10-CM | POA: Diagnosis not present

## 2017-11-07 DIAGNOSIS — M6281 Muscle weakness (generalized): Secondary | ICD-10-CM | POA: Diagnosis not present

## 2017-11-11 DIAGNOSIS — R2681 Unsteadiness on feet: Secondary | ICD-10-CM | POA: Diagnosis not present

## 2017-11-11 DIAGNOSIS — R278 Other lack of coordination: Secondary | ICD-10-CM | POA: Diagnosis not present

## 2017-11-11 DIAGNOSIS — M6281 Muscle weakness (generalized): Secondary | ICD-10-CM | POA: Diagnosis not present

## 2017-11-13 DIAGNOSIS — R278 Other lack of coordination: Secondary | ICD-10-CM | POA: Diagnosis not present

## 2017-11-13 DIAGNOSIS — M6281 Muscle weakness (generalized): Secondary | ICD-10-CM | POA: Diagnosis not present

## 2017-11-13 DIAGNOSIS — R2681 Unsteadiness on feet: Secondary | ICD-10-CM | POA: Diagnosis not present

## 2017-11-14 DIAGNOSIS — M6281 Muscle weakness (generalized): Secondary | ICD-10-CM | POA: Diagnosis not present

## 2017-11-14 DIAGNOSIS — R278 Other lack of coordination: Secondary | ICD-10-CM | POA: Diagnosis not present

## 2017-11-14 DIAGNOSIS — R2681 Unsteadiness on feet: Secondary | ICD-10-CM | POA: Diagnosis not present

## 2017-11-18 DIAGNOSIS — R278 Other lack of coordination: Secondary | ICD-10-CM | POA: Diagnosis not present

## 2017-11-18 DIAGNOSIS — R2681 Unsteadiness on feet: Secondary | ICD-10-CM | POA: Diagnosis not present

## 2017-11-18 DIAGNOSIS — M6281 Muscle weakness (generalized): Secondary | ICD-10-CM | POA: Diagnosis not present

## 2017-11-20 DIAGNOSIS — M6281 Muscle weakness (generalized): Secondary | ICD-10-CM | POA: Diagnosis not present

## 2017-11-20 DIAGNOSIS — R278 Other lack of coordination: Secondary | ICD-10-CM | POA: Diagnosis not present

## 2017-11-20 DIAGNOSIS — R2681 Unsteadiness on feet: Secondary | ICD-10-CM | POA: Diagnosis not present

## 2017-11-25 DIAGNOSIS — R2681 Unsteadiness on feet: Secondary | ICD-10-CM | POA: Diagnosis not present

## 2017-11-25 DIAGNOSIS — R278 Other lack of coordination: Secondary | ICD-10-CM | POA: Diagnosis not present

## 2017-11-25 DIAGNOSIS — M6281 Muscle weakness (generalized): Secondary | ICD-10-CM | POA: Diagnosis not present

## 2017-11-27 DIAGNOSIS — R2681 Unsteadiness on feet: Secondary | ICD-10-CM | POA: Diagnosis not present

## 2017-11-27 DIAGNOSIS — M6281 Muscle weakness (generalized): Secondary | ICD-10-CM | POA: Diagnosis not present

## 2017-11-27 DIAGNOSIS — R278 Other lack of coordination: Secondary | ICD-10-CM | POA: Diagnosis not present

## 2017-12-02 DIAGNOSIS — R278 Other lack of coordination: Secondary | ICD-10-CM | POA: Diagnosis not present

## 2017-12-02 DIAGNOSIS — R2681 Unsteadiness on feet: Secondary | ICD-10-CM | POA: Diagnosis not present

## 2017-12-02 DIAGNOSIS — M6281 Muscle weakness (generalized): Secondary | ICD-10-CM | POA: Diagnosis not present

## 2017-12-04 DIAGNOSIS — R278 Other lack of coordination: Secondary | ICD-10-CM | POA: Diagnosis not present

## 2017-12-04 DIAGNOSIS — M6281 Muscle weakness (generalized): Secondary | ICD-10-CM | POA: Diagnosis not present

## 2017-12-04 DIAGNOSIS — R2681 Unsteadiness on feet: Secondary | ICD-10-CM | POA: Diagnosis not present

## 2017-12-09 DIAGNOSIS — R2681 Unsteadiness on feet: Secondary | ICD-10-CM | POA: Diagnosis not present

## 2017-12-09 DIAGNOSIS — R278 Other lack of coordination: Secondary | ICD-10-CM | POA: Diagnosis not present

## 2017-12-09 DIAGNOSIS — M6281 Muscle weakness (generalized): Secondary | ICD-10-CM | POA: Diagnosis not present

## 2017-12-12 DIAGNOSIS — M6281 Muscle weakness (generalized): Secondary | ICD-10-CM | POA: Diagnosis not present

## 2017-12-12 DIAGNOSIS — R2681 Unsteadiness on feet: Secondary | ICD-10-CM | POA: Diagnosis not present

## 2017-12-12 DIAGNOSIS — R278 Other lack of coordination: Secondary | ICD-10-CM | POA: Diagnosis not present

## 2017-12-16 DIAGNOSIS — R278 Other lack of coordination: Secondary | ICD-10-CM | POA: Diagnosis not present

## 2017-12-16 DIAGNOSIS — R2681 Unsteadiness on feet: Secondary | ICD-10-CM | POA: Diagnosis not present

## 2017-12-16 DIAGNOSIS — I5032 Chronic diastolic (congestive) heart failure: Secondary | ICD-10-CM | POA: Diagnosis not present

## 2017-12-16 DIAGNOSIS — N183 Chronic kidney disease, stage 3 (moderate): Secondary | ICD-10-CM | POA: Diagnosis not present

## 2017-12-16 DIAGNOSIS — R0609 Other forms of dyspnea: Secondary | ICD-10-CM | POA: Diagnosis not present

## 2017-12-16 DIAGNOSIS — I10 Essential (primary) hypertension: Secondary | ICD-10-CM | POA: Diagnosis not present

## 2017-12-16 DIAGNOSIS — M6281 Muscle weakness (generalized): Secondary | ICD-10-CM | POA: Diagnosis not present

## 2017-12-18 DIAGNOSIS — R0602 Shortness of breath: Secondary | ICD-10-CM | POA: Diagnosis not present

## 2017-12-18 DIAGNOSIS — M6281 Muscle weakness (generalized): Secondary | ICD-10-CM | POA: Diagnosis not present

## 2017-12-18 DIAGNOSIS — R2681 Unsteadiness on feet: Secondary | ICD-10-CM | POA: Diagnosis not present

## 2017-12-18 DIAGNOSIS — R278 Other lack of coordination: Secondary | ICD-10-CM | POA: Diagnosis not present

## 2017-12-22 DIAGNOSIS — R0602 Shortness of breath: Secondary | ICD-10-CM | POA: Diagnosis not present

## 2017-12-23 DIAGNOSIS — M6281 Muscle weakness (generalized): Secondary | ICD-10-CM | POA: Diagnosis not present

## 2017-12-23 DIAGNOSIS — R2681 Unsteadiness on feet: Secondary | ICD-10-CM | POA: Diagnosis not present

## 2017-12-23 DIAGNOSIS — R278 Other lack of coordination: Secondary | ICD-10-CM | POA: Diagnosis not present

## 2017-12-24 DIAGNOSIS — I5022 Chronic systolic (congestive) heart failure: Secondary | ICD-10-CM | POA: Diagnosis not present

## 2017-12-24 DIAGNOSIS — Z79899 Other long term (current) drug therapy: Secondary | ICD-10-CM | POA: Diagnosis not present

## 2017-12-24 DIAGNOSIS — F5101 Primary insomnia: Secondary | ICD-10-CM | POA: Diagnosis not present

## 2017-12-25 DIAGNOSIS — R2681 Unsteadiness on feet: Secondary | ICD-10-CM | POA: Diagnosis not present

## 2017-12-25 DIAGNOSIS — M6281 Muscle weakness (generalized): Secondary | ICD-10-CM | POA: Diagnosis not present

## 2017-12-25 DIAGNOSIS — R278 Other lack of coordination: Secondary | ICD-10-CM | POA: Diagnosis not present

## 2018-01-15 DIAGNOSIS — R0609 Other forms of dyspnea: Secondary | ICD-10-CM | POA: Diagnosis not present

## 2018-01-15 DIAGNOSIS — I5032 Chronic diastolic (congestive) heart failure: Secondary | ICD-10-CM | POA: Diagnosis not present

## 2018-01-28 DIAGNOSIS — Z79899 Other long term (current) drug therapy: Secondary | ICD-10-CM | POA: Diagnosis not present

## 2018-03-25 DIAGNOSIS — I5032 Chronic diastolic (congestive) heart failure: Secondary | ICD-10-CM | POA: Diagnosis not present

## 2018-03-25 DIAGNOSIS — I4891 Unspecified atrial fibrillation: Secondary | ICD-10-CM | POA: Diagnosis not present

## 2018-03-25 DIAGNOSIS — R0609 Other forms of dyspnea: Secondary | ICD-10-CM | POA: Diagnosis not present

## 2018-03-25 DIAGNOSIS — N183 Chronic kidney disease, stage 3 (moderate): Secondary | ICD-10-CM | POA: Diagnosis not present

## 2018-04-07 DIAGNOSIS — I4891 Unspecified atrial fibrillation: Secondary | ICD-10-CM | POA: Diagnosis not present

## 2018-04-08 DIAGNOSIS — R0609 Other forms of dyspnea: Secondary | ICD-10-CM | POA: Diagnosis not present

## 2018-04-08 DIAGNOSIS — N183 Chronic kidney disease, stage 3 (moderate): Secondary | ICD-10-CM | POA: Diagnosis not present

## 2018-04-08 DIAGNOSIS — I4891 Unspecified atrial fibrillation: Secondary | ICD-10-CM | POA: Diagnosis not present

## 2018-04-08 DIAGNOSIS — I5032 Chronic diastolic (congestive) heart failure: Secondary | ICD-10-CM | POA: Diagnosis not present

## 2018-04-13 DIAGNOSIS — I4891 Unspecified atrial fibrillation: Secondary | ICD-10-CM | POA: Diagnosis present

## 2018-04-13 NOTE — H&P (Signed)
OFFICE VISIT NOTES COPIED TO EPIC FOR DOCUMENTATION  . History of Present Illness Madison Maine FNP-C; April 23, 2018 2:23 PM) Patient words: 2 week f/u for afib; LAst OV 03/25/2018 c/o sob.  The patient is a 84 year old female who presents for a Follow-up for Congestive heart failure. Madison Marquez is a pleasant Caucasian female admitted in January 2019 with acute decompensated heart failure with pulmonary edema, leg edema, PND and orthopnea. Echocardiogram revealed moderate LV systolic dysfunction, EF 00%. She was diuresed, and eventually underwent left heart catheterization due to atypical chest pain and LV systolic dysfunction, this revealed normal coronary arteries and low normal LVEF.  Recently seen for new onset Atrial fibrillation and was started on Xarelto 82m. [place] continues to have fatigue and shortness of breath. No leg edema, PND or orthopnea. Accompanied by her grandaughter. Tolerating medications well.   Problem List/Past Medical (April Harrington; 82019/08/291:30 PM) Hypothyroid (E03.9)  CKD (chronic kidney disease), stage III (N18.3)  due to htn Benign essential hypertension (I10)  Dyspnea on exertion (R06.09)  Nocturnal oximetry 12/18/2017: No significant desaturation noted. Normal study. Laboratory examination (Z01.89)  8Aug 29, 2019 Creatinine 1.02, EGFR 48/56, potassium 4.6, BMP otherwise normal. 01/28/2018: Creatinine 1.3, EGFR 38/46, potassium 4.9, BMP otherwise normal. 12/24/2017: Creatinine 1.10, EGFR 46/56, potassium 4.0, BMP otherwise normal. Labs 08/26/2017: Sodium 139, potassium 3.4, BUN 15, creatinine 0.90, eGFR 54 mL. BNP 257. HB 13.5/HCT 41.2, platelets 237. Glucose of 169, triglycerides 51, HDL 61, LDL 98. TSH normal. Chronic diastolic heart failure (IT62.26  Coronary angiogram 08/29/2017: Normal coronary arteries. Normal LV systolic function. Echocardiogram 01/15/2018: Left ventricle cavity is normal in size. Moderate concentric hypertrophy  of the left ventricle. Normal global wall motion. Doppler evidence of grade II (pseudonormal) diastolic dysfunction. Diastolic dysfunction findings suggests elevated LA/LV end diastolic pressure. Visual EF is 55-60%. Calculated EF 51%. Left atrial cavity is mildly dilated at 4.2 cm. Bicuspid AV with fused non and left coronary cusps. Mildly calcified right coronary cusp. No aortic stenosis. Moderate (Grade II) aortic regurgitation. Mild (Grade I) mitral regurgitation. Mild tricuspid regurgitation. No evidence of pulmonary hypertension. No significant change from Echocardiogram 08/26/2017 reduced LVEF at 35-40% has now improved. Atrial fibrillation, new onset (I48.91) [03/25/2018]: EKG 03/25/2018: Atrial fibrillation with controlled ventricular response at the rate of 88 bpm, left axis deviation, left anterior fascicular block. Anterolateral infarct old. Low-voltage complexes. Normal QT interval. EKG 09/11/2017: Normal sinus rhythm at the rate of 69 bpm, left axis deviation, left anterior fascicular block. Poor R-wave progression, cannot exclude anterolateral infarct old. Low-voltage complexes. PACs. CHA2DS2-VASc Score is 4 with yearly risk of stroke of 4 %. (CHF; HTN; vasc disease DM, Female = 1; Age <65 =1; 65-74 = 2, >75 =3; stroke = 3).  Allergies (April Harrington; 808-29-191:30 PM) No Known Drug Allergies [09/11/2017]:  Family History (April Harrington; 808-29-20191:30 PM) Mother  Deceased. at age [id number] natural causes, no heart attacks or strokes, no known cardiovascular conditions Father  [place] 371[place] no heart attacks or strokes, no known cardiovascular conditions Sister 2  younger-1 deceased from cancer, no heart attacks or strokes, no known cardiovascular conditions Brother 1  younger-DM, open heart surgery, no strokes  Social History (April Harrington; 82019-08-291:30 PM) Current tobacco use  Never smoker. Non Drinker/No Alcohol Use  Marital status  Widowed. Living  Situation  Lives in group home. independent Number of Children  2. 1 deceased  Past Surgical History (April Harrington; 8Aug 29, 20191:30 PM)  Hysterectomy; Total  at age [age] Ankle surgery  at age 31 Wrist surgery  Breast Biopsy - Left   Medication History (April Harrington; 04/08/2018 1:38 PM) Xarelto (15MG Tablet, 1 (one) Tablet Oral every evening after dinner, Taken starting 03/25/2018) Active. B Complex-C CR (1 Oral daily) Active. Carvedilol (6.25MG Tablet, 1 Oral two times daily) Active. Vitamin D (Cholecalciferol) (1000UNIT Tablet, 1 Oral daily) Active. Levothyroxine Sodium (75MCG Tablet, 1 Oral daily) Active. Lisinopril (5MG Tablet, 1 Oral daily) Active. Spironolactone (25MG Tablet, 1/2 tab Oral daily) Active. (dose decrease per JG due to lab results) B-12 (1000MCG Capsule, 1 Oral daily) Active. Medications Reconciled (verbally with pt; Pt states no change)  Diagnostic Studies History (April Harrington; 04/08/2018 1:37 PM) Coronary Angiogram [08/29/2017]: 08/29/2017: Normal coronary arteries. Normal LV systolic function. Treadmill stress test  15 yrs ago Echocardiogram  01/15/2018: Left ventricle cavity is normal in size. Moderate concentric hypertrophy of the left ventricle. Normal global wall motion. Doppler evidence of grade II (pseudonormal) diastolic dysfunction. Diastolic dysfunction findings suggests elevated LA/LV end diastolic pressure. Visual EF is 55-60%. Calculated EF 51%. Left atrial cavity is mildly dilated at 4.2 cm. Bicuspid AV with fused non and left coronary cusps. Mildly calcified right coronary cusp. No aortic stenosis. Moderate (Grade II) aortic regurgitation. Mild (Grade I) mitral regurgitation. Mild tricuspid regurgitation. No evidence of pulmonary hypertension. No significant change from Echocardiogram 08/26/2017 reduced LVEF at 35-40% has now improved.    Review of Systems Madison Maine, FNP-C; 04/08/2018 2:23 PM) General Present-  Fatigue. Not Present- Appetite Loss and Weight Gain. Respiratory Present- Difficulty Breathing on Exertion. Not Present- Chronic Cough and Wakes up from Sleep Wheezing or Short of Breath. Gastrointestinal Not Present- Black, Tarry Stool and Difficulty Swallowing. Musculoskeletal Not Present- Decreased Range of Motion and Muscle Atrophy. Neurological Not Present- Attention Deficit. Psychiatric Not Present- Personality Changes and Suicidal Ideation. Endocrine Not Present- Cold Intolerance and Heat Intolerance. Hematology Not Present- Abnormal Bleeding. All other systems negative  Vitals (April Harrington; 04/08/2018 1:40 PM) 04/08/2018 1:34 PM Weight: 162.56 lb Height: 64in Body Surface Area: 1.79 m Body Mass Index: 27.9 kg/m  Pulse: 71 (Regular)  P.OX: 98% (Room air) BP: 146/63 (Sitting, Right Arm, Standard)  Physical Exam Madison Maine, FNP-C; 04/08/2018 2:23 PM) General Mental Status-Alert. General Appearance-Cooperative and Appears stated age. Build & Nutrition-Well nourished and Moderately built.  Head and Neck Thyroid Gland Characteristics - normal size and consistency and no palpable nodules.  Chest and Lung Exam Chest and lung exam reveals -quiet, even and easy respiratory effort with no use of accessory muscles, non-tender and on auscultation, normal breath sounds, no adventitious sounds.  Cardiovascular Cardiovascular examination reveals -carotid auscultation reveals no bruits, abdominal aorta auscultation reveals no bruits and no prominent pulsation, femoral artery auscultation bilaterally reveals normal pulses, no bruits, no thrills, normal pedal pulses bilaterally and no digital clubbing, cyanosis, edema, increased warmth or tenderness. Auscultation Rhythm - Irregular. Heart Sounds - S1 is variable and S2 WNL. Murmurs & Other Heart Sounds - Normal exam - No Murmurs.  Abdomen Palpation/Percussion Normal exam - Non Tender and No  hepatosplenomegaly.  Neurologic Neurologic evaluation reveals -alert and oriented x 3 with no impairment of recent or remote memory. Motor-Grossly intact without any focal deficits.  Musculoskeletal Global Assessment Left Lower Extremity - no deformities, masses or tenderness, no known fractures. Right Lower Extremity - no deformities, masses or tenderness, no known fractures.  Assessment & Plan Madison Maine FNP-C; 04/08/2018 2:25 PM) Atrial fibrillation with rapid ventricular  response (I48.91) Story: EKG 04/08/2018: Atrial fibrillation with rapid ventricular response at the rate of 110 bpm, leftward axis, cannot exclude inferior infarct, anterolateral infarct old. Low-voltage complexes. Nonspecific T abnormality No significant change from EKG 03/25/2018: Atrial fibrillation with controlled ventricular response at the rate of 88 bpm.  EKG 09/11/2017: Normal sinus rhythm at the rate of 69 bpm, left axis deviation, left anterior fascicular block. Poor R-wave progression, cannot exclude anterolateral infarct old. Low-voltage complexes. PACs.  CHA2DS2-VASc Score is 4 with yearly risk of stroke of 4 %. (CHF; HTN; vasc disease DM, Female = 1; Age <65 =1; 65-74 = 2, >75 =3; stroke = 3). Current Plans Started Amiodarone HCl 200MG, 1 (one) Tablet twice a day, #60, 30 days starting 04/08/2018, Ref. x1. Continued Xarelto 15MG, 1 (one) Tablet every evening after dinner, #30, 30 days starting 04/08/2018, Ref. x2. Complete electrocardiogram (93000) Dyspnea on exertion (R06.09) Story: Nocturnal oximetry 12/18/2017: No significant desaturation noted. Normal study. Chronic diastolic heart failure (K02.54) Story: Coronary angiogram 08/29/2017: Normal coronary arteries. Normal LV systolic function.  Echocardiogram 01/15/2018: Left ventricle cavity is normal in size. Moderate concentric hypertrophy of the left ventricle. Normal global wall motion. Doppler evidence of grade II (pseudonormal) diastolic  dysfunction. Diastolic dysfunction findings suggests elevated LA/LV end diastolic pressure. Visual EF is 55-60%. Calculated EF 51%. Left atrial cavity is mildly dilated at 4.2 cm. Bicuspid AV with fused non and left coronary cusps. Mildly calcified right coronary cusp. No aortic stenosis. Moderate (Grade II) aortic regurgitation. Mild (Grade I) mitral regurgitation. Mild tricuspid regurgitation. No evidence of pulmonary hypertension. No significant change from Echocardiogram 08/26/2017 reduced LVEF at 35-40% has now improved. CKD (chronic kidney disease), stage III (N18.3) Story: due to htn Benign essential hypertension (I10) Laboratory examination (Y70.62) Story: 04/08/2018: Creatinine 1.02, EGFR 48/56, potassium 4.6, BMP otherwise normal.  01/28/2018: Creatinine 1.3, EGFR 38/46, potassium 4.9, BMP otherwise normal.  12/24/2017: Creatinine 1.10, EGFR 46/56, potassium 4.0, BMP otherwise normal.  Labs 08/26/2017: Sodium 139, potassium 3.4, BUN 15, creatinine 0.90, eGFR 54 mL. BNP 257. HB 13.5/HCT 41.2, platelets 237. Glucose of 169, triglycerides 51, HDL 61, LDL 98. TSH normal.  Note:-  Recommendations:  Madison Marquez is a pleasant Caucasian female admitted in January 2019 with acute decompensated heart failure, ejection fraction was 35% by echocardiogram and she had significant leg edema and pulmonary edema that resolved with diuresis. 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   Her echocardiogram done in June 2019 reveals preserved LVEF, her main complaint today is new onset of marked fatigue and worsening dyspnea over the past 2 weeks. Recently seen for new onset A fib. Her creatinine improved on decreasing aldactone since last OV. No clinical e/o acute CHF.  Tolerating Xarelto 15 mg in the evening after dinner. Continues to have  symptomatic atrial fibrillation that is not rate controlled. I'll set her up for direct current cardioversion. Will start her on Amiodarone 200 mg BID to improve success of cardioversion. Schedule for Direct current cardioversion. I have discussed regarding risks benefits rate control vs rhythm control with the patient. Patient understands cardiac arrest and need for CPR, aspiration pneumonia, but not limited to these. Patient is willing. She is having difficulty with Xarelto due to cost and will try to obtain patient assistance. Coumadin is another option; however, wishes to not do this due to lack of transportation and need for frequent monitoring. OV after  the procedure.  *I have discussed this case with Dr. Einar Gip and he personally examined the patient and participated in formulating the plan.*  CC: Lajean Manes, MD  Signed by Madison Maine, FNP-C (04/08/2018 2:26 PM)

## 2018-04-14 ENCOUNTER — Ambulatory Visit (HOSPITAL_COMMUNITY): Payer: Medicare Other | Admitting: Certified Registered Nurse Anesthetist

## 2018-04-14 ENCOUNTER — Encounter (HOSPITAL_COMMUNITY): Payer: Self-pay | Admitting: *Deleted

## 2018-04-14 ENCOUNTER — Encounter (HOSPITAL_COMMUNITY)
Admission: RE | Disposition: A | Discharge status: Home / Self Care | Payer: Self-pay | Source: Ambulatory Visit | Attending: Cardiology

## 2018-04-14 ENCOUNTER — Ambulatory Visit (HOSPITAL_COMMUNITY)
Admission: RE | Admit: 2018-04-14 | Discharge: 2018-04-14 | Disposition: A | Discharge status: Home / Self Care | Payer: Medicare Other | Source: Ambulatory Visit | Attending: Cardiology | Admitting: Cardiology

## 2018-04-14 DIAGNOSIS — Z8249 Family history of ischemic heart disease and other diseases of the circulatory system: Secondary | ICD-10-CM | POA: Insufficient documentation

## 2018-04-14 DIAGNOSIS — R0609 Other forms of dyspnea: Secondary | ICD-10-CM | POA: Diagnosis not present

## 2018-04-14 DIAGNOSIS — I4891 Unspecified atrial fibrillation: Secondary | ICD-10-CM | POA: Diagnosis not present

## 2018-04-14 DIAGNOSIS — E039 Hypothyroidism, unspecified: Secondary | ICD-10-CM | POA: Insufficient documentation

## 2018-04-14 DIAGNOSIS — Z9889 Other specified postprocedural states: Secondary | ICD-10-CM | POA: Diagnosis not present

## 2018-04-14 DIAGNOSIS — Z7902 Long term (current) use of antithrombotics/antiplatelets: Secondary | ICD-10-CM | POA: Diagnosis not present

## 2018-04-14 DIAGNOSIS — I10 Essential (primary) hypertension: Secondary | ICD-10-CM | POA: Diagnosis not present

## 2018-04-14 DIAGNOSIS — Z79890 Hormone replacement therapy (postmenopausal): Secondary | ICD-10-CM | POA: Diagnosis not present

## 2018-04-14 DIAGNOSIS — I5032 Chronic diastolic (congestive) heart failure: Secondary | ICD-10-CM | POA: Insufficient documentation

## 2018-04-14 DIAGNOSIS — I130 Hypertensive heart and chronic kidney disease with heart failure and stage 1 through stage 4 chronic kidney disease, or unspecified chronic kidney disease: Secondary | ICD-10-CM | POA: Insufficient documentation

## 2018-04-14 DIAGNOSIS — N183 Chronic kidney disease, stage 3 (moderate): Secondary | ICD-10-CM | POA: Diagnosis not present

## 2018-04-14 DIAGNOSIS — Z79899 Other long term (current) drug therapy: Secondary | ICD-10-CM | POA: Insufficient documentation

## 2018-04-14 DIAGNOSIS — Z90710 Acquired absence of both cervix and uterus: Secondary | ICD-10-CM | POA: Insufficient documentation

## 2018-04-14 DIAGNOSIS — E7800 Pure hypercholesterolemia, unspecified: Secondary | ICD-10-CM | POA: Diagnosis not present

## 2018-04-14 HISTORY — PX: CARDIOVERSION: SHX1299

## 2018-04-14 SURGERY — CARDIOVERSION
Anesthesia: General

## 2018-04-14 MED ORDER — SODIUM CHLORIDE 0.9 % IV SOLN
INTRAVENOUS | Status: AC | PRN
  Administered 2018-04-14: 500 mL via INTRAVENOUS

## 2018-04-14 MED ORDER — HYDROCORTISONE 1 % EX CREA: 1.0000 "application " | TOPICAL_CREAM | CUTANEOUS | 0 refills | Status: DC | PRN

## 2018-04-14 MED ORDER — SODIUM CHLORIDE 0.9 % IV SOLN
INTRAVENOUS | Status: DC | PRN
  Administered 2018-04-14: 12:00:00 via INTRAVENOUS

## 2018-04-14 MED ORDER — HYDROCORTISONE 1 % EX CREA
1.0000 "application " | TOPICAL_CREAM | CUTANEOUS | Status: DC | PRN
  Administered 2018-04-14: 1 via TOPICAL
  Filled 2018-04-14: qty 28

## 2018-04-14 MED ORDER — PROPOFOL 10 MG/ML IV BOLUS
INTRAVENOUS | Status: DC | PRN
  Administered 2018-04-14: 50 mg via INTRAVENOUS

## 2018-04-14 MED ORDER — PHENYLEPHRINE 40 MCG/ML (10ML) SYRINGE FOR IV PUSH (FOR BLOOD PRESSURE SUPPORT)
PREFILLED_SYRINGE | INTRAVENOUS | Status: DC | PRN
  Administered 2018-04-14 (×2): 80 ug via INTRAVENOUS

## 2018-04-14 MED ORDER — LIDOCAINE 2% (20 MG/ML) 5 ML SYRINGE
INTRAMUSCULAR | Status: DC | PRN
  Administered 2018-04-14: 50 mg via INTRAVENOUS

## 2018-04-14 NOTE — Interval H&P Note (Signed)
History and Physical Interval Note:  04/14/2018 12:29 PM  Madison Marquez  has presented today for surgery, with the diagnosis of afib  The various methods of treatment have been discussed with the patient and family. After consideration of risks, benefits and other options for treatment, the patient has consented to  Procedure(s): CARDIOVERSION (N/A) as a surgical intervention .  The patient's history has been reviewed, patient examined, no change in status, stable for surgery.  I have reviewed the patient's chart and labs.  Questions were answered to the patient's satisfaction.     Adrian Prows

## 2018-04-14 NOTE — Anesthesia Postprocedure Evaluation (Signed)
Anesthesia Post Note  Patient: [place]  Procedure(s) Performed: CARDIOVERSION (N/A )     Patient location during evaluation: PACU Anesthesia Type: General Level of consciousness: awake and alert Pain management: pain level controlled Vital Signs Assessment: post-procedure vital signs reviewed and stable Respiratory status: spontaneous breathing, nonlabored ventilation, respiratory function stable and patient connected to nasal cannula oxygen Cardiovascular status: blood pressure returned to baseline and stable Postop Assessment: no apparent nausea or vomiting Anesthetic complications: no    Last Vitals:  Vitals:   04/14/18 1300 04/14/18 1310  BP: 99/62 (!) 108/47  Pulse: (!) 57 (!) 48  Resp: 16 15  Temp:    SpO2: 100% 96%    Last Pain:  Vitals:   04/14/18 1300  TempSrc:   PainSc: 0-No pain                 Ryan P Ellender

## 2018-04-14 NOTE — Discharge Instructions (Signed)
Electrical Cardioversion, Care After °This sheet gives you information about how to care for yourself after your procedure. Your health care provider may also give you more specific instructions. If you have problems or questions, contact your health care provider. °What can I expect after the procedure? °After the procedure, it is common to have: °· Some redness on the skin where the shocks were given. ° °Follow these instructions at home: °· Do not drive for 24 hours if you were given a medicine to help you relax (sedative). °· Take over-the-counter and prescription medicines only as told by your health care provider. °· Ask your health care provider how to check your pulse. Check it often. °· Rest for 48 hours after the procedure or as told by your health care provider. °· Avoid or limit your caffeine use as told by your health care provider. °Contact a health care provider if: °· You feel like your heart is beating too quickly or your pulse is not regular. °· You have a serious muscle cramp that does not go away. °Get help right away if: °· You have discomfort in your chest. °· You are dizzy or you feel faint. °· You have trouble breathing or you are short of breath. °· Your speech is slurred. °· You have trouble moving an arm or leg on one side of your body. °· Your fingers or toes turn cold or blue. °This information is not intended to replace advice given to you by your health care provider. Make sure you discuss any questions you have with your health care provider. °Document Released: 06/02/2013 Document Revised: 03/15/2016 Document Reviewed: 02/16/2016 °Elsevier Interactive Patient Education © 2018 [employer] ° °

## 2018-04-14 NOTE — Anesthesia Preprocedure Evaluation (Addendum)
Anesthesia Evaluation  Patient identified by MRN, date of birth, ID band Patient awake    Reviewed: Allergy & Precautions, NPO status , Patient's Chart, lab work & pertinent test results  Airway Mallampati: III  TM Distance: >3 FB Neck ROM: Full    Dental no notable dental hx. (+) Partial Upper, Partial Lower, Dental Advisory Given   Pulmonary neg pulmonary ROS,    Pulmonary exam normal breath sounds clear to auscultation       Cardiovascular hypertension, Pt. on medications and Pt. on home beta blockers Normal cardiovascular exam+ dysrhythmias Atrial Fibrillation  Rhythm:Irregular Rate:Normal  Left Ventricle The left ventricular size is normal. The left ventricular systolic function is normal. LV end diastolic pressure is normal. The left ventricular ejection fraction is 55-65% by visual estimate. No regional wall motion abnormalities. There is no evidence of mitral regurgitation.    Neuro/Psych  Headaches, negative psych ROS   GI/Hepatic negative GI ROS, Neg liver ROS,   Endo/Other  Hypothyroidism   Renal/GU negative Renal ROS     Musculoskeletal negative musculoskeletal ROS (+)   Abdominal   Peds  Hematology negative hematology ROS (+)   Anesthesia Other Findings   Reproductive/Obstetrics                           Anesthesia Physical  Anesthesia Plan  ASA: III  Anesthesia Plan: General   Post-op Pain Management:    Induction: Intravenous  PONV Risk Score and Plan: Treatment may vary due to age or medical condition and Propofol infusion  Airway Management Planned: Mask  Additional Equipment:   Intra-op Plan:   Post-operative Plan:   Informed Consent: I have reviewed the patients History and Physical, chart, labs and discussed the procedure including the risks, benefits and alternatives for the proposed anesthesia with the patient or authorized representative who has indicated  his/her understanding and acceptance.   Dental advisory given  Plan Discussed with: CRNA  Anesthesia Plan Comments:       Anesthesia Quick Evaluation

## 2018-04-14 NOTE — Transfer of Care (Signed)
Immediate Anesthesia Transfer of Care Note  Patient: Madison Marquez  Procedure(s) Performed: CARDIOVERSION (N/A )  Patient Location: Endoscopy Unit  Anesthesia Type:General  Level of Consciousness: awake, alert  and oriented  Airway & Oxygen Therapy: Patient Spontanous Breathing  Post-op Assessment: Report given to RN, Post -op Vital signs reviewed and stable and Patient moving all extremities X 4  Post vital signs: Reviewed and stable  Last Vitals:  Vitals Value Taken Time  BP 83/50 04/14/2018 12:47 PM  Temp 36.4 C 04/14/2018 12:47 PM  Pulse 45 04/14/2018 12:47 PM  Resp 21 04/14/2018 12:47 PM  SpO2 99 % 04/14/2018 12:47 PM    Last Pain:  Vitals:   04/14/18 1247  TempSrc: Oral  PainSc:          Complications: No apparent anesthesia complications

## 2018-04-14 NOTE — CV Procedure (Signed)
Direct current cardioversion:  Indication symptomatic A. Fibrillation.  Procedure: Using 50 mg of IV Propofol and 50 IV Lidocaine (for reducing venous pain) for achieving deep sedation, synchronized direct current cardioversion performed. Patient was delivered with 100x1, 120x1, 150  Joules of electricity X 3 with success to NSR. Patient tolerated the procedure well. No immediate complication noted.

## 2018-04-14 NOTE — Anesthesia Procedure Notes (Signed)
Procedure Name: General with mask airway Date/Time: 04/14/2018 12:28 PM Performed by: Harden Mo, CRNA Pre-anesthesia Checklist: Patient identified, Emergency Drugs available, Patient being monitored and Suction available Patient Re-evaluated:Patient Re-evaluated prior to induction Oxygen Delivery Method: Ambu bag Preoxygenation: Pre-oxygenation with 100% oxygen Induction Type: IV induction Placement Confirmation: positive ETCO2 and breath sounds checked- equal and bilateral Dental Injury: Teeth and Oropharynx as per pre-operative assessment

## 2018-04-24 DIAGNOSIS — R0609 Other forms of dyspnea: Secondary | ICD-10-CM | POA: Diagnosis not present

## 2018-04-24 DIAGNOSIS — I4892 Unspecified atrial flutter: Secondary | ICD-10-CM | POA: Diagnosis not present

## 2018-04-24 DIAGNOSIS — I480 Paroxysmal atrial fibrillation: Secondary | ICD-10-CM | POA: Diagnosis not present

## 2018-04-24 DIAGNOSIS — I5032 Chronic diastolic (congestive) heart failure: Secondary | ICD-10-CM | POA: Diagnosis not present

## 2018-05-01 DIAGNOSIS — I480 Paroxysmal atrial fibrillation: Secondary | ICD-10-CM | POA: Diagnosis not present

## 2018-05-05 ENCOUNTER — Encounter (HOSPITAL_COMMUNITY): Payer: Self-pay

## 2018-05-05 ENCOUNTER — Other Ambulatory Visit: Payer: Self-pay

## 2018-05-05 ENCOUNTER — Ambulatory Visit (HOSPITAL_COMMUNITY): Payer: Medicare Other | Admitting: Anesthesiology

## 2018-05-05 ENCOUNTER — Encounter (HOSPITAL_COMMUNITY)
Admission: RE | Disposition: A | Discharge status: Home / Self Care | Payer: Self-pay | Source: Ambulatory Visit | Attending: Cardiology

## 2018-05-05 ENCOUNTER — Ambulatory Visit (HOSPITAL_COMMUNITY)
Admission: RE | Admit: 2018-05-05 | Discharge: 2018-05-05 | Disposition: A | Discharge status: Home / Self Care | Payer: Medicare Other | Source: Ambulatory Visit | Attending: Cardiology | Admitting: Cardiology

## 2018-05-05 DIAGNOSIS — I130 Hypertensive heart and chronic kidney disease with heart failure and stage 1 through stage 4 chronic kidney disease, or unspecified chronic kidney disease: Secondary | ICD-10-CM | POA: Insufficient documentation

## 2018-05-05 DIAGNOSIS — I4892 Unspecified atrial flutter: Secondary | ICD-10-CM | POA: Insufficient documentation

## 2018-05-05 DIAGNOSIS — I083 Combined rheumatic disorders of mitral, aortic and tricuspid valves: Secondary | ICD-10-CM | POA: Insufficient documentation

## 2018-05-05 DIAGNOSIS — R0789 Other chest pain: Secondary | ICD-10-CM | POA: Insufficient documentation

## 2018-05-05 DIAGNOSIS — I4891 Unspecified atrial fibrillation: Secondary | ICD-10-CM | POA: Diagnosis not present

## 2018-05-05 DIAGNOSIS — M1990 Unspecified osteoarthritis, unspecified site: Secondary | ICD-10-CM | POA: Insufficient documentation

## 2018-05-05 DIAGNOSIS — I5032 Chronic diastolic (congestive) heart failure: Secondary | ICD-10-CM | POA: Diagnosis not present

## 2018-05-05 DIAGNOSIS — E7800 Pure hypercholesterolemia, unspecified: Secondary | ICD-10-CM | POA: Diagnosis not present

## 2018-05-05 DIAGNOSIS — E039 Hypothyroidism, unspecified: Secondary | ICD-10-CM | POA: Insufficient documentation

## 2018-05-05 DIAGNOSIS — I480 Paroxysmal atrial fibrillation: Secondary | ICD-10-CM | POA: Diagnosis not present

## 2018-05-05 DIAGNOSIS — R0609 Other forms of dyspnea: Secondary | ICD-10-CM | POA: Insufficient documentation

## 2018-05-05 DIAGNOSIS — Z7901 Long term (current) use of anticoagulants: Secondary | ICD-10-CM | POA: Diagnosis not present

## 2018-05-05 DIAGNOSIS — I444 Left anterior fascicular block: Secondary | ICD-10-CM | POA: Diagnosis not present

## 2018-05-05 DIAGNOSIS — N183 Chronic kidney disease, stage 3 (moderate): Secondary | ICD-10-CM | POA: Diagnosis not present

## 2018-05-05 HISTORY — PX: CARDIOVERSION: SHX1299

## 2018-05-05 SURGERY — CARDIOVERSION
Anesthesia: General

## 2018-05-05 MED ORDER — LIDOCAINE 2% (20 MG/ML) 5 ML SYRINGE
INTRAMUSCULAR | Status: DC | PRN
  Administered 2018-05-05: 40 mg via INTRAVENOUS

## 2018-05-05 MED ORDER — EPHEDRINE SULFATE-NACL 50-0.9 MG/10ML-% IV SOSY
PREFILLED_SYRINGE | INTRAVENOUS | Status: DC | PRN
  Administered 2018-05-05: 10 mg via INTRAVENOUS

## 2018-05-05 MED ORDER — PROPOFOL 10 MG/ML IV BOLUS
INTRAVENOUS | Status: DC | PRN
  Administered 2018-05-05: 50 mg via INTRAVENOUS

## 2018-05-05 MED ORDER — SODIUM CHLORIDE 0.9 % IV SOLN
INTRAVENOUS | Status: DC
  Administered 2018-05-05: 500 mL via INTRAVENOUS

## 2018-05-05 NOTE — H&P (Signed)
Madison Marquez 05/01/2018 1:00 PM Location: [facility] Patient #: [id number] DOB: 09/08/1923 Widowed / Language: Madison Marquez / Race: Refused to Report/Unreported Female   The patient is a 84 year old female.   Assessment & Plan Madison Page MD; 05/01/2018 1:57 PM) Paroxysmal atrial fibrillation (I48.0) Story: Direct current cardioversion 04/14/2018: A. Fibrillation: 120x1, 150x2, 150x3 to NSR  EKG 05/01/2018: Atrial fibrillation with controlled 20) at the rate of 89 bpm him a inferior infarct old, anterolateral lateral infarct old. Low-voltage complexes. Nonspecific T abnormality. Compared to EKG 04/08/2018: Atrial fibrillation with rapid ventricular response at the rate of 110 bpm Impression: CHA2DS2-VASc Score is 4 with yearly risk of stroke of 4 %. (CHF; HTN; vasc disease DM, Female = 1; Age <65 =1;  65-74 = 2, >75 =3; stroke = 3). Current Plans Changed Xarelto 15MG, 1 (one) Tablet every evening after dinner, #30, 30 days starting 05/01/2018, Ref. x2. Started Carvedilol 6.25MG, 1 Tablet two times daily, #180, 90 days starting 05/01/2018, Ref. x3. Complete electrocardiogram (56812) Chronic diastolic heart failure (X51.70) Story: Coronary angiogram 08/29/2017: Normal coronary arteries. Normal LV systolic function.  Echocardiogram 01/15/2018: Left ventricle cavity is normal in size. Moderate concentric hypertrophy of the left ventricle. Normal global wall motion. Doppler evidence of grade II (pseudonormal) diastolic dysfunction. Diastolic dysfunction findings suggests elevated LA/LV end diastolic pressure. Visual EF is 55-60%. Calculated EF 51%. Left atrial cavity is mildly dilated at 4.2 cm. Bicuspid AV with fused non and left coronary cusps. Mildly calcified right coronary cusp. No aortic stenosis. Moderate (Grade II) aortic regurgitation. Mild (Grade I) mitral regurgitation. Mild tricuspid regurgitation. No evidence of pulmonary hypertension. No significant  change from Echocardiogram 08/26/2017 reduced LVEF at 35-40% has now improved. Current Plans Started Spironolactone 25MG, 1/2 tab Tablet daily, #30, 30 days starting 05/01/2018, Ref. x2. Local Order: dose decrease per Roby due to lab results   Signed by Madison Page, MD (05/01/2018 1:58 PM)  ------------------------------------------------------------------------------------------  Madison Marquez 04/24/2018 1:00 PM Location: [facility] Patient #: [id number] DOB: [birth year] Widowed / Language: Madison Marquez / Race: Refused to Report/Unreported Female   History of Present Illness Madison Maine FNP-C; 04/25/2018 9:10 PM) Patient words: Last O/V 04/08/2018; F/U for Cardioversion.  The patient is a 84 year old female who presents for a Follow-up for Congestive heart failure. Madison Marquez is a pleasant Caucasian female admitted in January 2019 with acute decompensated heart failure with pulmonary edema, leg edema, PND and orthopnea. Echocardiogram revealed moderate LV systolic dysfunction, EF 44%. She was diuresed, and eventually underwent left heart catheterization due to atypical chest pain and LV systolic dysfunction, this revealed normal coronary arteries and low normal LVEF.  Patient was started on amiodarone prior to cardioversion that was performed on 04/14/2018 and was successful. She now presents for post procedure follow-up. Continues to have fatigue and dyspnea. No bleeding from anticoagulation.   Problem List/Past Medical (Madison Marquez; 04/24/2018 1:07 PM) Hypothyroid (E03.9)  CKD (chronic kidney disease), stage III (N18.3)  due to htn Benign essential hypertension (I10)  Dyspnea on exertion (R06.09)  Nocturnal oximetry 12/18/2017: No significant desaturation noted. Normal study. Laboratory examination (Z01.89)  04/08/2018: Creatinine 1.02, EGFR 48/56, potassium 4.6, BMP otherwise normal. 01/28/2018: Creatinine 1.3, EGFR 38/46, potassium 4.9, BMP  otherwise normal. 12/24/2017: Creatinine 1.10, EGFR 46/56, potassium 4.0, BMP otherwise normal. Labs 08/26/2017: Sodium 139, potassium 3.4, BUN 15, creatinine 0.90, eGFR 54 mL. BNP 257. HB 13.5/HCT 41.2, platelets 237. Glucose of 169, triglycerides 51, HDL 61,  LDL 98. TSH normal. Chronic diastolic heart failure (T51.76)  Coronary angiogram 08/29/2017: Normal coronary arteries. Normal LV systolic function. Echocardiogram 01/15/2018: Left ventricle cavity is normal in size. Moderate concentric hypertrophy of the left ventricle. Normal global wall motion. Doppler evidence of grade II (pseudonormal) diastolic dysfunction. Diastolic dysfunction findings suggests elevated LA/LV end diastolic pressure. Visual EF is 55-60%. Calculated EF 51%. Left atrial cavity is mildly dilated at 4.2 cm. Bicuspid AV with fused non and left coronary cusps. Mildly calcified right coronary cusp. No aortic stenosis. Moderate (Grade II) aortic regurgitation. Mild (Grade I) mitral regurgitation. Mild tricuspid regurgitation. No evidence of pulmonary hypertension. No significant change from Echocardiogram 08/26/2017 reduced LVEF at 35-40% has now improved. Atrial fibrillation with rapid ventricular response (I48.91) [03/25/2018]: Direct current cardioversion 04/14/2018: A. Fibrillation: 120x1, 150x2, 150x3 to NSR EKG 04/08/2018: Atrial fibrillation with rapid ventricular response at the rate of 110 bpm, leftward axis, cannot exclude inferior infarct, anterolateral infarct old. Low-voltage complexes. Nonspecific T abnormality No significant change from EKG 03/25/2018: Atrial fibrillation with controlled ventricular response at the rate of 88 bpm. EKG 09/11/2017: Normal sinus rhythm at the rate of 69 bpm, left axis deviation, left anterior fascicular block. Poor R-wave progression, cannot exclude anterolateral infarct old. Low-voltage complexes. PACs. CHA2DS2-VASc Score is 4 with yearly risk of stroke of 4 %. (CHF; HTN; vasc disease DM, Female =  1; Age <65 =1; 65-74 = 2, >75 =3; stroke = 3).  Allergies (Madison Marquez; 2018/05/17 1:07 PM) No Known Drug Allergies [09/11/2017]:  Family History (Madison Marquez; 17-May-2018 1:07 PM) Mother  Deceased. at age 4 from natural causes, no heart attacks or strokes, no known cardiovascular conditions Father  Deceased. at age 41 from [place], no heart attacks or strokes, no known cardiovascular conditions Sister 2  younger-1 deceased from cancer, no heart attacks or strokes, no known cardiovascular conditions Brother 1  younger-DM, open heart surgery, no strokes  Social History (Madison Marquez; 05-17-2018 1:07 PM) Current tobacco use  Never smoker. Non Drinker/No Alcohol Use  Marital status  Widowed. Living Situation  Lives in group home. independent Number of Children  2. 1 deceased  Past Surgical History (Madison Marquez; 05-17-2018 1:07 PM) Hysterectomy; Total  at age [age] Ankle surgery  at age 25 Wrist surgery  Breast Biopsy - Left   Medication History (Madison Marquez; 2018/05/17 1:14 PM) Xarelto (15MG Tablet, 1 (one) Tablet Oral every evening after dinner, Taken starting 04/08/2018) Active. Amiodarone HCl (200MG Tablet, 1 (one) Tablet Oral twice a day, Taken starting 04/08/2018) Active. B Complex-C CR (1 Oral daily) Active. Carvedilol (6.25MG Tablet, 1 Oral two times daily) Active. Vitamin D (Cholecalciferol) (1000UNIT Tablet, 1 Oral daily) Active. Levothyroxine Sodium (75MCG Tablet, 1 Oral daily) Active. Lisinopril (5MG Tablet, 1 Oral daily) Active. Spironolactone (25MG Tablet, 1/2 tab Oral daily) Active. (dose decrease per JG due to lab results) B-12 (1000MCG Capsule, 1 Oral daily) Active. Medications Reconciled (Pt brought list)  Diagnostic Studies History (Madison Marquez; 05/17/18 1:12 PM) Coronary Angiogram [08/29/2017]: 08/29/2017: Normal coronary arteries. Normal LV systolic function. Treadmill stress test  15 yrs ago Echocardiogram  01/15/2018: Left  ventricle cavity is normal in size. Moderate concentric hypertrophy of the left ventricle. Normal global wall motion. Doppler evidence of grade II (pseudonormal) diastolic dysfunction. Diastolic dysfunction findings suggests elevated LA/LV end diastolic pressure. Visual EF is 55-60%. Calculated EF 51%. Left atrial cavity is mildly dilated at 4.2 cm. Bicuspid AV with fused non and left coronary cusps. Mildly calcified right coronary cusp. No aortic stenosis. Moderate (  Grade II) aortic regurgitation. Mild (Grade I) mitral regurgitation. Mild tricuspid regurgitation. No evidence of pulmonary hypertension. No significant change from Echocardiogram 08/26/2017 reduced LVEF at 35-40% has now improved.    Review of Systems Madison Maine, FNP-C; 04/25/2018 9:19 PM) General Present- Fatigue. Not Present- Appetite Loss and Weight Gain. Respiratory Present- Difficulty Breathing on Exertion. Not Present- Chronic Cough and Wakes up from Sleep Wheezing or Short of Breath. Gastrointestinal Not Present- Black, Tarry Stool and Difficulty Swallowing. Musculoskeletal Not Present- Decreased Range of Motion and Muscle Atrophy. Neurological Not Present- Attention Deficit. Psychiatric Not Present- Personality Changes and Suicidal Ideation. Endocrine Not Present- Cold Intolerance and Heat Intolerance. Hematology Not Present- Abnormal Bleeding. All other systems negative  Vitals (Madison Marquez; 04/24/2018 1:15 PM) 04/24/2018 1:08 PM Weight: 162.38 lb Height: 64in Body Surface Area: 1.79 m Body Mass Index: 27.87 kg/m  Pulse: 67 (Regular)  P.OX: 97% (Room air) BP: 109/74 (Sitting, Left Arm, Standard)       Physical Exam Madison Maine, FNP-C; 04/25/2018 9:19 PM) General Mental Status-Alert. General Appearance-Cooperative and Appears stated age. Build & Nutrition-Well nourished and Moderately built.  Head and Neck Thyroid Gland Characteristics - normal size and consistency and no  palpable nodules.  Chest and Lung Exam Chest and lung exam reveals -quiet, even and easy respiratory effort with no use of accessory muscles, non-tender and on auscultation, normal breath sounds, no adventitious sounds.  Cardiovascular Cardiovascular examination reveals -carotid auscultation reveals no bruits, abdominal aorta auscultation reveals no bruits and no prominent pulsation, femoral artery auscultation bilaterally reveals normal pulses, no bruits, no thrills, normal pedal pulses bilaterally and no digital clubbing, cyanosis, edema, increased warmth or tenderness. Auscultation Rhythm - Irregular. Heart Sounds - S1 is variable and S2 WNL. Murmurs & Other Heart Sounds - Normal exam - No Murmurs.  Abdomen Palpation/Percussion Normal exam - Non Tender and No hepatosplenomegaly.  Neurologic Neurologic evaluation reveals -alert and oriented x 3 with no impairment of recent or remote memory. Motor-Grossly intact without any focal deficits.  Musculoskeletal Global Assessment Left Lower Extremity - no deformities, masses or tenderness, no known fractures. Right Lower Extremity - no deformities, masses or tenderness, no known fractures.    Assessment & Plan Madison Maine FNP-C; 04/25/2018 9:19 PM) Atrial flutter with controlled response (I48.92) Paroxysmal atrial fibrillation (I48.0) Story: Direct current cardioversion 04/14/2018: A. Fibrillation: 120x1, 150x2, 150x3 to NSR  EKG 04/08/2018: Atrial fibrillation with rapid ventricular response at the rate of 110 bpm, leftward axis, cannot exclude inferior infarct, anterolateral infarct old. Low-voltage complexes. Nonspecific T abnormality No significant change from EKG 03/25/2018: Atrial fibrillation with controlled ventricular response at the rate of 88 bpm.  EKG 09/11/2017: Normal sinus rhythm at the rate of 69 bpm, left axis deviation, left anterior fascicular block. Poor R-wave progression, cannot exclude anterolateral  infarct old. Low-voltage complexes. PACs.  CHA2DS2-VASc Score is 4 with yearly risk of stroke of 4 %. (CHF; HTN; vasc disease DM, Female = 1; Age <65 =1; 65-74 = 2, >75 =3; stroke = 3). Impression: CHA2DS2-VASc Score is 4 with yearly risk of stroke of 4 %. (CHF; HTN; vasc disease DM, Female = 1; Age <65 =1;  65-74 = 2, >75 =3; stroke = 3). Dyspnea on exertion (R06.09) Story: Nocturnal oximetry 12/18/2017: No significant desaturation noted. Normal study. Chronic diastolic heart failure (H70.26) Story: Coronary angiogram 08/29/2017: Normal coronary arteries. Normal LV systolic function.  Echocardiogram 01/15/2018: Left ventricle cavity is normal in size. Moderate concentric hypertrophy of the left ventricle. Normal  global wall motion. Doppler evidence of grade II (pseudonormal) diastolic dysfunction. Diastolic dysfunction findings suggests elevated LA/LV end diastolic pressure. Visual EF is 55-60%. Calculated EF 51%. Left atrial cavity is mildly dilated at 4.2 cm. Bicuspid AV with fused non and left coronary cusps. Mildly calcified right coronary cusp. No aortic stenosis. Moderate (Grade II) aortic regurgitation. Mild (Grade I) mitral regurgitation. Mild tricuspid regurgitation. No evidence of pulmonary hypertension. No significant change from Echocardiogram 08/26/2017 reduced LVEF at 35-40% has now improved. Benign essential hypertension (I10) Laboratory examination (W38.93) Story: 04/08/2018: Creatinine 1.02, EGFR 48/56, potassium 4.6, BMP otherwise normal.  01/28/2018: Creatinine 1.3, EGFR 38/46, potassium 4.9, BMP otherwise normal.  12/24/2017: Creatinine 1.10, EGFR 46/56, potassium 4.0, BMP otherwise normal.  Labs 08/26/2017: Sodium 139, potassium 3.4, BUN 15, creatinine 0.90, eGFR 54 mL. BNP 257. HB 13.5/HCT 41.2, platelets 237. Glucose of 169, triglycerides 51, HDL 61, LDL 98. TSH normal.  Note:-  Recommendations:  Susan Marquez is a pleasant Caucasian female admitted in  January 2019 with acute decompensated heart failure, ejection fraction was 35% by echocardiogram and she had significant leg edema and pulmonary edema that resolved with diuresis. 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   Patient underwent successful cardioversion on 04/14/2018; however, EKG today reveals atrial flutter that is rate controlled. She continues to be symptomatic with fatigue and dyspnea on exertion. She is currently on amiodarone. Will continue with current dose and repeat cardioversion if she continues to be in atrial flutter in one week. Tolerating anticoagulation well without any bleeding diathesis. Blood pressure is stable. We'll see her back in one week for repeat EKG.  *I have discussed this case with Dr. Einar Gip and he participated in formulating the plan.*  CC: Lajean Manes, MD    Signed by Madison Maine, FNP-C (04/25/2018 9:20 PM)

## 2018-05-05 NOTE — Anesthesia Preprocedure Evaluation (Addendum)
Anesthesia Evaluation  Patient identified by MRN, date of birth, ID band Patient awake    Reviewed: Allergy & Precautions, NPO status , Patient's Chart, lab work & pertinent test results, reviewed documented beta blocker date and time   Airway Mallampati: II  TM Distance: >3 FB Neck ROM: Full    Dental  (+) Teeth Intact, Dental Advisory Given   Pulmonary neg pulmonary ROS,    breath sounds clear to auscultation       Cardiovascular +CHF  + dysrhythmias Atrial Fibrillation  Rhythm:Irregular Rate:Abnormal     Neuro/Psych  Headaches,    GI/Hepatic negative GI ROS, Neg liver ROS,   Endo/Other  Hypothyroidism   Renal/GU negative Renal ROS     Musculoskeletal  (+) Arthritis ,   Abdominal Normal abdominal exam  (+)   Peds  Hematology negative hematology ROS (+)   Anesthesia Other Findings   Reproductive/Obstetrics                            Echo: - Left ventricle: The cavity size was normal. There was moderate   concentric hypertrophy. Systolic function was moderately reduced.   The estimated ejection fraction was in the range of 35% to 40%.   Diffuse hypokinesis. Features are consistent with a pseudonormal   left ventricular filling pattern, with concomitant abnormal   relaxation and increased filling pressure (grade 2 diastolic   dysfunction). Doppler parameters are consistent with elevated   ventricular end-diastolic filling pressure. - Aortic valve: Trileaflet; moderately thickened, severely   calcified leaflets. Transvalvular velocity was minimally   increased. There was no stenosis. There was moderate   regurgitation. - Mitral valve: There was no regurgitation. - Right ventricle: The cavity size was normal. Wall thickness was   normal. Systolic function was normal. - Right atrium: The atrium was normal in size. - Tricuspid valve: There was mild regurgitation. - Pulmonic valve: There  was trivial regurgitation. - Pulmonary arteries: Systolic pressure was mildly increased. PA   peak pressure: 32 mm Hg (S). - Inferior vena cava: The vessel was normal in size. - Pericardium, extracardiac: There was no pericardial effusion.   There was a left pleural effusion.  Anesthesia Physical Anesthesia Plan  ASA: III  Anesthesia Plan: General   Post-op Pain Management:    Induction: Intravenous  PONV Risk Score and Plan: Treatment may vary due to age or medical condition  Airway Management Planned: Mask  Additional Equipment: None  Intra-op Plan:   Post-operative Plan:   Informed Consent: I have reviewed the patients History and Physical, chart, labs and discussed the procedure including the risks, benefits and alternatives for the proposed anesthesia with the patient or authorized representative who has indicated his/her understanding and acceptance.     Plan Discussed with: CRNA  Anesthesia Plan Comments:        Anesthesia Quick Evaluation

## 2018-05-05 NOTE — Transfer of Care (Signed)
Immediate Anesthesia Transfer of Care Note  Patient: Madison Marquez  Procedure(s) Performed: CARDIOVERSION (N/A )  Patient Location: Endoscopy Unit  Anesthesia Type:General  Level of Consciousness: drowsy and patient cooperative  Airway & Oxygen Therapy: Patient Spontanous Breathing  Post-op Assessment: Report given to RN and Post -op Vital signs reviewed and stable  Post vital signs: Reviewed and stable  Last Vitals:  Vitals Value Taken Time  BP    Temp    Pulse    Resp    SpO2      Last Pain:  Vitals:   05/05/18 1304  TempSrc: Oral  PainSc: 0-No pain         Complications: No apparent anesthesia complications

## 2018-05-05 NOTE — CV Procedure (Signed)
Direct current cardioversion:  Indication symptomatic A. Fibrillation.  Procedure: Under deep sedation administered by anesthesiology, synchronized direct current cardioversion performed. Patient was delivered with 120, 120 Joules of electricity X 2. Patient was in intermittent slow Afib and sinus bradycardia with multifocal PAC's. Given her hypotension 70/50 mmHg, bradycardia, and suspicion of sinus node dysfunction, I decided not to perform more attempts of cardioversion. Patient was previously on amiodarone. If necessary, we could consider other antiarrhtymic drugs in the future.   Patient tolerated the procedure well. No immediate complication noted.   Nigel Mormon, MD [facility] Pager: [phone] Office: [phone] If no answer Cell [phone]

## 2018-05-05 NOTE — Interval H&P Note (Signed)
History and Physical Interval Note:  05/05/2018 2:16 PM  Madison Marquez  has presented today for surgery, with the diagnosis of A-FIB  The various methods of treatment have been discussed with the patient and family. After consideration of risks, benefits and other options for treatment, the patient has consented to  Procedure(s): CARDIOVERSION (N/A) as a surgical intervention .  The patient's history has been reviewed, patient examined, no change in status, stable for surgery.  I have reviewed the patient's chart and labs.  Questions were answered to the patient's satisfaction.     [name]

## 2018-05-05 NOTE — Discharge Instructions (Signed)
Electrical Cardioversion, Care After °This sheet gives you information about how to care for yourself after your procedure. Your health care provider may also give you more specific instructions. If you have problems or questions, contact your health care provider. °What can I expect after the procedure? °After the procedure, it is common to have: °· Some redness on the skin where the shocks were given. ° °Follow these instructions at home: °· Do not drive for 24 hours if you were given a medicine to help you relax (sedative). °· Take over-the-counter and prescription medicines only as told by your health care provider. °· Ask your health care provider how to check your pulse. Check it often. °· Rest for 48 hours after the procedure or as told by your health care provider. °· Avoid or limit your caffeine use as told by your health care provider. °Contact a health care provider if: °· You feel like your heart is beating too quickly or your pulse is not regular. °· You have a serious muscle cramp that does not go away. °Get help right away if: °· You have discomfort in your chest. °· You are dizzy or you feel faint. °· You have trouble breathing or you are short of breath. °· Your speech is slurred. °· You have trouble moving an arm or leg on one side of your body. °· Your fingers or toes turn cold or blue. °This information is not intended to replace advice given to you by your health care provider. Make sure you discuss any questions you have with your health care provider. °Document Released: 06/02/2013 Document Revised: 03/15/2016 Document Reviewed: 02/16/2016 °Elsevier Interactive Patient Education © 2018 [employer] ° °

## 2018-05-06 ENCOUNTER — Encounter (HOSPITAL_COMMUNITY): Payer: Self-pay | Admitting: Cardiology

## 2018-05-06 NOTE — Anesthesia Postprocedure Evaluation (Signed)
Anesthesia Post Note  Patient: Madison Marquez  Procedure(s) Performed: CARDIOVERSION (N/A )     Patient location during evaluation: PACU Anesthesia Type: General Level of consciousness: awake and alert Pain management: pain level controlled Vital Signs Assessment: post-procedure vital signs reviewed and stable Respiratory status: spontaneous breathing, nonlabored ventilation, respiratory function stable and patient connected to nasal cannula oxygen Cardiovascular status: blood pressure returned to baseline and stable Postop Assessment: no apparent nausea or vomiting Anesthetic complications: no    Last Vitals:  Vitals:   05/05/18 1500 05/05/18 1510  BP: 106/72 (!) 106/45  Pulse: 99 (!) 52  Resp: (!) 22 18  Temp:    SpO2: 96% (!) 64%    Last Pain:  Vitals:   05/05/18 1510  TempSrc:   PainSc: 0-No pain                 Effie Berkshire

## 2018-05-14 DIAGNOSIS — I480 Paroxysmal atrial fibrillation: Secondary | ICD-10-CM | POA: Diagnosis not present

## 2018-05-14 DIAGNOSIS — I4892 Unspecified atrial flutter: Secondary | ICD-10-CM | POA: Diagnosis not present

## 2018-05-14 DIAGNOSIS — R0609 Other forms of dyspnea: Secondary | ICD-10-CM | POA: Diagnosis not present

## 2018-05-14 DIAGNOSIS — R001 Bradycardia, unspecified: Secondary | ICD-10-CM | POA: Diagnosis not present

## 2018-06-08 DIAGNOSIS — I480 Paroxysmal atrial fibrillation: Secondary | ICD-10-CM | POA: Diagnosis not present

## 2018-06-09 DIAGNOSIS — M6281 Muscle weakness (generalized): Secondary | ICD-10-CM | POA: Diagnosis not present

## 2018-06-09 DIAGNOSIS — M25552 Pain in left hip: Secondary | ICD-10-CM | POA: Diagnosis not present

## 2018-06-09 DIAGNOSIS — R262 Difficulty in walking, not elsewhere classified: Secondary | ICD-10-CM | POA: Diagnosis not present

## 2018-06-10 DIAGNOSIS — M25552 Pain in left hip: Secondary | ICD-10-CM | POA: Diagnosis not present

## 2018-06-10 DIAGNOSIS — M6281 Muscle weakness (generalized): Secondary | ICD-10-CM | POA: Diagnosis not present

## 2018-06-10 DIAGNOSIS — R262 Difficulty in walking, not elsewhere classified: Secondary | ICD-10-CM | POA: Diagnosis not present

## 2018-06-11 DIAGNOSIS — I480 Paroxysmal atrial fibrillation: Secondary | ICD-10-CM | POA: Diagnosis not present

## 2018-06-11 DIAGNOSIS — I4892 Unspecified atrial flutter: Secondary | ICD-10-CM | POA: Diagnosis not present

## 2018-06-11 DIAGNOSIS — R0609 Other forms of dyspnea: Secondary | ICD-10-CM | POA: Diagnosis not present

## 2018-06-11 DIAGNOSIS — R001 Bradycardia, unspecified: Secondary | ICD-10-CM | POA: Diagnosis not present

## 2018-06-12 DIAGNOSIS — R262 Difficulty in walking, not elsewhere classified: Secondary | ICD-10-CM | POA: Diagnosis not present

## 2018-06-12 DIAGNOSIS — M25552 Pain in left hip: Secondary | ICD-10-CM | POA: Diagnosis not present

## 2018-06-12 DIAGNOSIS — M6281 Muscle weakness (generalized): Secondary | ICD-10-CM | POA: Diagnosis not present

## 2018-06-16 DIAGNOSIS — M25552 Pain in left hip: Secondary | ICD-10-CM | POA: Diagnosis not present

## 2018-06-16 DIAGNOSIS — R262 Difficulty in walking, not elsewhere classified: Secondary | ICD-10-CM | POA: Diagnosis not present

## 2018-06-16 DIAGNOSIS — M6281 Muscle weakness (generalized): Secondary | ICD-10-CM | POA: Diagnosis not present

## 2018-06-18 DIAGNOSIS — M25552 Pain in left hip: Secondary | ICD-10-CM | POA: Diagnosis not present

## 2018-06-18 DIAGNOSIS — R262 Difficulty in walking, not elsewhere classified: Secondary | ICD-10-CM | POA: Diagnosis not present

## 2018-06-18 DIAGNOSIS — M6281 Muscle weakness (generalized): Secondary | ICD-10-CM | POA: Diagnosis not present

## 2018-06-19 DIAGNOSIS — M6281 Muscle weakness (generalized): Secondary | ICD-10-CM | POA: Diagnosis not present

## 2018-06-19 DIAGNOSIS — M25552 Pain in left hip: Secondary | ICD-10-CM | POA: Diagnosis not present

## 2018-06-19 DIAGNOSIS — R262 Difficulty in walking, not elsewhere classified: Secondary | ICD-10-CM | POA: Diagnosis not present

## 2018-06-22 DIAGNOSIS — R262 Difficulty in walking, not elsewhere classified: Secondary | ICD-10-CM | POA: Diagnosis not present

## 2018-06-22 DIAGNOSIS — M25552 Pain in left hip: Secondary | ICD-10-CM | POA: Diagnosis not present

## 2018-06-22 DIAGNOSIS — M6281 Muscle weakness (generalized): Secondary | ICD-10-CM | POA: Diagnosis not present

## 2018-06-24 DIAGNOSIS — R262 Difficulty in walking, not elsewhere classified: Secondary | ICD-10-CM | POA: Diagnosis not present

## 2018-06-24 DIAGNOSIS — M25552 Pain in left hip: Secondary | ICD-10-CM | POA: Diagnosis not present

## 2018-06-24 DIAGNOSIS — M6281 Muscle weakness (generalized): Secondary | ICD-10-CM | POA: Diagnosis not present

## 2018-06-25 DIAGNOSIS — M6281 Muscle weakness (generalized): Secondary | ICD-10-CM | POA: Diagnosis not present

## 2018-06-25 DIAGNOSIS — M25552 Pain in left hip: Secondary | ICD-10-CM | POA: Diagnosis not present

## 2018-06-25 DIAGNOSIS — R262 Difficulty in walking, not elsewhere classified: Secondary | ICD-10-CM | POA: Diagnosis not present

## 2018-06-30 DIAGNOSIS — M6281 Muscle weakness (generalized): Secondary | ICD-10-CM | POA: Diagnosis not present

## 2018-06-30 DIAGNOSIS — M25552 Pain in left hip: Secondary | ICD-10-CM | POA: Diagnosis not present

## 2018-06-30 DIAGNOSIS — R262 Difficulty in walking, not elsewhere classified: Secondary | ICD-10-CM | POA: Diagnosis not present

## 2018-07-02 DIAGNOSIS — R262 Difficulty in walking, not elsewhere classified: Secondary | ICD-10-CM | POA: Diagnosis not present

## 2018-07-02 DIAGNOSIS — M6281 Muscle weakness (generalized): Secondary | ICD-10-CM | POA: Diagnosis not present

## 2018-07-02 DIAGNOSIS — M25552 Pain in left hip: Secondary | ICD-10-CM | POA: Diagnosis not present

## 2018-07-03 DIAGNOSIS — M6281 Muscle weakness (generalized): Secondary | ICD-10-CM | POA: Diagnosis not present

## 2018-07-03 DIAGNOSIS — M25552 Pain in left hip: Secondary | ICD-10-CM | POA: Diagnosis not present

## 2018-07-03 DIAGNOSIS — R262 Difficulty in walking, not elsewhere classified: Secondary | ICD-10-CM | POA: Diagnosis not present

## 2018-07-06 DIAGNOSIS — M25552 Pain in left hip: Secondary | ICD-10-CM | POA: Diagnosis not present

## 2018-07-06 DIAGNOSIS — R262 Difficulty in walking, not elsewhere classified: Secondary | ICD-10-CM | POA: Diagnosis not present

## 2018-07-06 DIAGNOSIS — M6281 Muscle weakness (generalized): Secondary | ICD-10-CM | POA: Diagnosis not present

## 2018-07-08 DIAGNOSIS — M6281 Muscle weakness (generalized): Secondary | ICD-10-CM | POA: Diagnosis not present

## 2018-07-08 DIAGNOSIS — R262 Difficulty in walking, not elsewhere classified: Secondary | ICD-10-CM | POA: Diagnosis not present

## 2018-07-08 DIAGNOSIS — M25552 Pain in left hip: Secondary | ICD-10-CM | POA: Diagnosis not present

## 2018-07-10 DIAGNOSIS — M25552 Pain in left hip: Secondary | ICD-10-CM | POA: Diagnosis not present

## 2018-07-10 DIAGNOSIS — M6281 Muscle weakness (generalized): Secondary | ICD-10-CM | POA: Diagnosis not present

## 2018-07-10 DIAGNOSIS — R262 Difficulty in walking, not elsewhere classified: Secondary | ICD-10-CM | POA: Diagnosis not present

## 2018-07-13 DIAGNOSIS — R262 Difficulty in walking, not elsewhere classified: Secondary | ICD-10-CM | POA: Diagnosis not present

## 2018-07-13 DIAGNOSIS — M25552 Pain in left hip: Secondary | ICD-10-CM | POA: Diagnosis not present

## 2018-07-13 DIAGNOSIS — M6281 Muscle weakness (generalized): Secondary | ICD-10-CM | POA: Diagnosis not present

## 2018-07-15 DIAGNOSIS — M25552 Pain in left hip: Secondary | ICD-10-CM | POA: Diagnosis not present

## 2018-07-15 DIAGNOSIS — R262 Difficulty in walking, not elsewhere classified: Secondary | ICD-10-CM | POA: Diagnosis not present

## 2018-07-15 DIAGNOSIS — M6281 Muscle weakness (generalized): Secondary | ICD-10-CM | POA: Diagnosis not present

## 2018-07-17 DIAGNOSIS — M25552 Pain in left hip: Secondary | ICD-10-CM | POA: Diagnosis not present

## 2018-07-17 DIAGNOSIS — R262 Difficulty in walking, not elsewhere classified: Secondary | ICD-10-CM | POA: Diagnosis not present

## 2018-07-17 DIAGNOSIS — M6281 Muscle weakness (generalized): Secondary | ICD-10-CM | POA: Diagnosis not present

## 2018-07-20 DIAGNOSIS — M6281 Muscle weakness (generalized): Secondary | ICD-10-CM | POA: Diagnosis not present

## 2018-07-20 DIAGNOSIS — R262 Difficulty in walking, not elsewhere classified: Secondary | ICD-10-CM | POA: Diagnosis not present

## 2018-07-20 DIAGNOSIS — M25552 Pain in left hip: Secondary | ICD-10-CM | POA: Diagnosis not present

## 2018-07-21 DIAGNOSIS — M25552 Pain in left hip: Secondary | ICD-10-CM | POA: Diagnosis not present

## 2018-07-21 DIAGNOSIS — R262 Difficulty in walking, not elsewhere classified: Secondary | ICD-10-CM | POA: Diagnosis not present

## 2018-07-21 DIAGNOSIS — M6281 Muscle weakness (generalized): Secondary | ICD-10-CM | POA: Diagnosis not present

## 2018-07-31 DIAGNOSIS — M6281 Muscle weakness (generalized): Secondary | ICD-10-CM | POA: Diagnosis not present

## 2018-07-31 DIAGNOSIS — M25552 Pain in left hip: Secondary | ICD-10-CM | POA: Diagnosis not present

## 2018-07-31 DIAGNOSIS — R262 Difficulty in walking, not elsewhere classified: Secondary | ICD-10-CM | POA: Diagnosis not present

## 2018-08-03 DIAGNOSIS — M6281 Muscle weakness (generalized): Secondary | ICD-10-CM | POA: Diagnosis not present

## 2018-08-03 DIAGNOSIS — R262 Difficulty in walking, not elsewhere classified: Secondary | ICD-10-CM | POA: Diagnosis not present

## 2018-08-03 DIAGNOSIS — M25552 Pain in left hip: Secondary | ICD-10-CM | POA: Diagnosis not present

## 2018-08-05 DIAGNOSIS — M25552 Pain in left hip: Secondary | ICD-10-CM | POA: Diagnosis not present

## 2018-08-05 DIAGNOSIS — R262 Difficulty in walking, not elsewhere classified: Secondary | ICD-10-CM | POA: Diagnosis not present

## 2018-08-05 DIAGNOSIS — M6281 Muscle weakness (generalized): Secondary | ICD-10-CM | POA: Diagnosis not present

## 2018-08-10 DIAGNOSIS — M6281 Muscle weakness (generalized): Secondary | ICD-10-CM | POA: Diagnosis not present

## 2018-08-10 DIAGNOSIS — M25552 Pain in left hip: Secondary | ICD-10-CM | POA: Diagnosis not present

## 2018-08-10 DIAGNOSIS — R262 Difficulty in walking, not elsewhere classified: Secondary | ICD-10-CM | POA: Diagnosis not present

## 2018-08-12 DIAGNOSIS — R262 Difficulty in walking, not elsewhere classified: Secondary | ICD-10-CM | POA: Diagnosis not present

## 2018-08-12 DIAGNOSIS — M25552 Pain in left hip: Secondary | ICD-10-CM | POA: Diagnosis not present

## 2018-08-12 DIAGNOSIS — M6281 Muscle weakness (generalized): Secondary | ICD-10-CM | POA: Diagnosis not present

## 2018-08-18 DIAGNOSIS — M6281 Muscle weakness (generalized): Secondary | ICD-10-CM | POA: Diagnosis not present

## 2018-08-18 DIAGNOSIS — M25552 Pain in left hip: Secondary | ICD-10-CM | POA: Diagnosis not present

## 2018-08-18 DIAGNOSIS — R262 Difficulty in walking, not elsewhere classified: Secondary | ICD-10-CM | POA: Diagnosis not present

## 2018-08-21 DIAGNOSIS — M6281 Muscle weakness (generalized): Secondary | ICD-10-CM | POA: Diagnosis not present

## 2018-08-21 DIAGNOSIS — R262 Difficulty in walking, not elsewhere classified: Secondary | ICD-10-CM | POA: Diagnosis not present

## 2018-08-21 DIAGNOSIS — M25552 Pain in left hip: Secondary | ICD-10-CM | POA: Diagnosis not present

## 2018-08-24 DIAGNOSIS — M6281 Muscle weakness (generalized): Secondary | ICD-10-CM | POA: Diagnosis not present

## 2018-08-24 DIAGNOSIS — M25552 Pain in left hip: Secondary | ICD-10-CM | POA: Diagnosis not present

## 2018-08-24 DIAGNOSIS — R262 Difficulty in walking, not elsewhere classified: Secondary | ICD-10-CM | POA: Diagnosis not present

## 2018-09-09 DIAGNOSIS — R0609 Other forms of dyspnea: Secondary | ICD-10-CM | POA: Diagnosis not present

## 2018-09-09 DIAGNOSIS — R001 Bradycardia, unspecified: Secondary | ICD-10-CM | POA: Diagnosis not present

## 2018-09-09 DIAGNOSIS — I480 Paroxysmal atrial fibrillation: Secondary | ICD-10-CM | POA: Diagnosis not present

## 2018-09-09 DIAGNOSIS — I4892 Unspecified atrial flutter: Secondary | ICD-10-CM | POA: Diagnosis not present

## 2018-09-25 DIAGNOSIS — M7062 Trochanteric bursitis, left hip: Secondary | ICD-10-CM | POA: Diagnosis not present

## 2018-09-25 DIAGNOSIS — Z1389 Encounter for screening for other disorder: Secondary | ICD-10-CM | POA: Diagnosis not present

## 2018-09-25 DIAGNOSIS — Z79899 Other long term (current) drug therapy: Secondary | ICD-10-CM | POA: Diagnosis not present

## 2018-09-25 DIAGNOSIS — E039 Hypothyroidism, unspecified: Secondary | ICD-10-CM | POA: Diagnosis not present

## 2018-09-25 DIAGNOSIS — I5032 Chronic diastolic (congestive) heart failure: Secondary | ICD-10-CM | POA: Diagnosis not present

## 2018-09-25 DIAGNOSIS — Z0000 Encounter for general adult medical examination without abnormal findings: Secondary | ICD-10-CM | POA: Diagnosis not present

## 2018-09-25 DIAGNOSIS — Z23 Encounter for immunization: Secondary | ICD-10-CM | POA: Diagnosis not present

## 2018-09-25 DIAGNOSIS — I480 Paroxysmal atrial fibrillation: Secondary | ICD-10-CM | POA: Diagnosis not present

## 2018-09-28 DIAGNOSIS — M25552 Pain in left hip: Secondary | ICD-10-CM | POA: Diagnosis not present

## 2018-10-14 DIAGNOSIS — M25552 Pain in left hip: Secondary | ICD-10-CM | POA: Diagnosis not present

## 2018-11-03 ENCOUNTER — Other Ambulatory Visit: Payer: Self-pay | Admitting: Cardiology

## 2018-12-11 ENCOUNTER — Other Ambulatory Visit: Payer: Self-pay | Admitting: Cardiology

## 2018-12-11 DIAGNOSIS — I5032 Chronic diastolic (congestive) heart failure: Secondary | ICD-10-CM

## 2019-01-05 ENCOUNTER — Other Ambulatory Visit: Payer: Self-pay | Admitting: Cardiology

## 2019-01-05 NOTE — Telephone Encounter (Signed)
Please fill

## 2019-02-18 DIAGNOSIS — M25552 Pain in left hip: Secondary | ICD-10-CM | POA: Diagnosis not present

## 2019-03-04 ENCOUNTER — Ambulatory Visit (INDEPENDENT_AMBULATORY_CARE_PROVIDER_SITE_OTHER): Payer: Medicare Other

## 2019-03-04 ENCOUNTER — Other Ambulatory Visit: Payer: Self-pay

## 2019-03-04 DIAGNOSIS — I5032 Chronic diastolic (congestive) heart failure: Secondary | ICD-10-CM

## 2019-03-08 NOTE — Progress Notes (Signed)
Echo unchanged from Jan 2019, will discuss at [facility]

## 2019-03-11 ENCOUNTER — Ambulatory Visit (INDEPENDENT_AMBULATORY_CARE_PROVIDER_SITE_OTHER): Payer: Medicare Other | Admitting: Cardiology

## 2019-03-11 ENCOUNTER — Other Ambulatory Visit: Payer: Self-pay

## 2019-03-11 ENCOUNTER — Encounter: Payer: Medicare Other | Admitting: Cardiology

## 2019-03-11 ENCOUNTER — Encounter: Payer: Self-pay | Admitting: Cardiology

## 2019-03-11 VITALS — BP 119/73 | HR 137 | Ht 64.0 in | Wt 160.1 lb

## 2019-03-11 DIAGNOSIS — I5042 Chronic combined systolic (congestive) and diastolic (congestive) heart failure: Secondary | ICD-10-CM

## 2019-03-11 DIAGNOSIS — N183 Chronic kidney disease, stage 3 unspecified: Secondary | ICD-10-CM

## 2019-03-11 DIAGNOSIS — I4892 Unspecified atrial flutter: Secondary | ICD-10-CM | POA: Diagnosis not present

## 2019-03-11 DIAGNOSIS — I480 Paroxysmal atrial fibrillation: Secondary | ICD-10-CM

## 2019-03-11 MED ORDER — AMIODARONE HCL 200 MG PO TABS: 200.0000 mg | ORAL_TABLET | Freq: Two times a day (BID) | ORAL | 1 refills | Status: DC

## 2019-03-11 NOTE — Progress Notes (Signed)
Erroneous encounter

## 2019-03-11 NOTE — Progress Notes (Signed)
Primary Physician:  Lajean Manes, MD   Patient ID: Madison Marquez, female    DOB: October 12, 1923, 85 y.o.   MRN: [id number]  Subjective:    Chief Complaint  Patient presents with  . Follow-up    ECHO RESULTS    HPI: Madison Marquez  is a 85 y.o. female  with chronic diastolic heart failure, CKD stage 3, hypertension, hypothyroidism, and paroxysmal atrial flutter and paroxysmal atrial fibrillation.  Patient was admitted in Jan 2019 with acute decompensated heart failure. Echocardiogram revealed moderate LV systolic dysfunction, EF 31%. She was diuresed, and eventually underwent left heart catheterization due to atypical chest pain and LV systolic dysfunction, this revealed normal coronary arteries and low normal LVEF. Patient underwent successful cardioversion on 04/14/2018; however, on follow up was in atrial flutter. Due to recurrent atrial fibrillation, underwent cardioversion x 2 on 05/05/2018 that continued to show intermittent slow A. fib and sinus bradycardia with multifocal PACs and given hypotension, bradycardia, and suspicion of sinus node dysfunction further attempts at cardioversion were not attempted. She has had bradycardia with higher doses of Coreg. By echocardiogram in May 2019, LVEF had normalized with maintaining sinus rhythm.   She is here on 6 month office visit and follow up for paroxysmal atrial fibrillation and to discuss recent echocardiogram results  Over the last few months she has been feeling short of breath and extremely tired.  Her son in law is present at bedside.  She had previously been taken off of Aldactone due to worsening kidney function, but due to confusion with pharmacy she has been back on this for the last few months.  She believes she is taking Coreg 6.25 mg twice daily.  She admits to taking only a half a tablet of her Xarelto daily due to cost.  No bleeding diathesis reported.  Denies any leg swelling or chest pain.  Past Medical History:   Diagnosis Date  . Acute CHF (congestive heart failure) ([place]) 08/25/2017  . Arthritis    "probably in my right knee" (08/25/2017)  . [facility]ell Patient infection [phone]  . Hypercholesterolemia   . Hypothyroid   . Migraine    "I've had 1 in my lifetime" (08/25/2017)    Past Surgical History:  Procedure Laterality Date  . ABDOMINAL HYSTERECTOMY    . ANKLE FRACTURE SURGERY Right   . BLADDER SUSPENSION    . BREAST LUMPECTOMY Left   . BREAST SURGERY Left    "leaky nipple"  . CARDIOVERSION N/A 04/14/2018   Procedure: CARDIOVERSION;  Surgeon: Adrian Prows, MD;  [facility]: [facility] ENDOSCOPY;  Service: Cardiovascular;  Laterality: N/A;  . CARDIOVERSION N/A 05/05/2018   Procedure: CARDIOVERSION;  Surgeon: Nigel Mormon, MD;  Location: [place] ENDOSCOPY;  Service: Cardiovascular;  Laterality: N/A;  . CARPAL TUNNEL RELEASE Left 10/15/2016   Procedure: LEFT CARPAL TUNNEL RELEASE;  Surgeon: Daryll Brod, MD;  Location: [place];  Service: Orthopedics;  Laterality: Left;  . CATARACT EXTRACTION W/ INTRAOCULAR LENS  IMPLANT, BILATERAL Bilateral 2017  . FRACTURE SURGERY    . HERNIA REPAIR    . LEFT HEART CATH AND CORONARY ANGIOGRAPHY N/A 08/29/2017   Procedure: LEFT HEART CATH AND CORONARY ANGIOGRAPHY;  Surgeon: Adrian Prows, MD;  Location: [facility] CV LAB;  Service: Cardiovascular;  Laterality: N/A;  . UMBILICAL HERNIA REPAIR      Social History   Socioeconomic History  . Marital status: Widowed    Spouse name: Not on file  . Number of children: 2  . Years  of education: Not on file  . Highest education level: Not on file  Occupational History  . Not on file  Social Needs  . Financial resource strain: Not on file  . Food insecurity    Worry: Not on file    Inability: Not on file  . Transportation needs    Medical: Not on file    Non-medical: Not on file  Tobacco Use  . Smoking status: Never Smoker  . Smokeless tobacco: Never Used  Substance and Sexual Activity  . Alcohol use: No   . Drug use: No  . Sexual activity: Not on file  Lifestyle  . Physical activity    Days per week: Not on file    Minutes per session: Not on file  . Stress: Not on file  Relationships  . Social Herbalist on phone: Not on file    Gets together: Not on file    Attends religious service: Not on file    Active member of club or organization: Not on file    Attends meetings of clubs or organizations: Not on file    Relationship status: Not on file  . Intimate partner violence    Fear of current or ex partner: Not on file    Emotionally abused: Not on file    Physically abused: Not on file    Forced sexual activity: Not on file  Other Topics Concern  . Not on file  Social History Narrative  . Not on file    Review of Systems  Constitution: Positive for malaise/fatigue. Negative for decreased appetite, weight gain and weight loss.  Eyes: Negative for visual disturbance.  Cardiovascular: Positive for dyspnea on exertion. Negative for chest pain, claudication, leg swelling, orthopnea, palpitations and syncope.  Respiratory: Negative for hemoptysis and wheezing.   Endocrine: Negative for cold intolerance and heat intolerance.  Hematologic/Lymphatic: Does not bruise/bleed easily.  Skin: Negative for nail changes.  Musculoskeletal: Positive for joint pain (left hip). Negative for muscle weakness and myalgias.  Gastrointestinal: Negative for abdominal pain, change in bowel habit, nausea and vomiting.  Neurological: Negative for difficulty with concentration, dizziness, focal weakness and headaches.  Psychiatric/Behavioral: Negative for altered mental status and suicidal ideas.  All other systems reviewed and are negative.     Objective:  Blood pressure 119/73, pulse (!) 137, height 5' 4"  (1.626 m), weight 160 lb 1.6 oz (72.6 kg), SpO2 95 %. Body mass index is 27.48 kg/m.    Physical Exam  Constitutional: She is oriented to person, place, and time. Vital signs are normal.  She appears well-developed and well-nourished.  HENT:  Head: Normocephalic and atraumatic.  Neck: Normal range of motion.  Cardiovascular: Regular rhythm, normal heart sounds and intact distal pulses. Tachycardia present.  Pulmonary/Chest: Effort normal and breath sounds normal. No accessory muscle usage. No respiratory distress.  Abdominal: Soft. Bowel sounds are normal.  Musculoskeletal: Normal range of motion.  Neurological: She is alert and oriented to person, place, and time.  Skin: Skin is warm and dry.  Vitals reviewed.  Radiology: No results found.  Laboratory examination:   06/08/2018: Creatinine 1.02, EGFR 48/55, potassium 4.6, BMP otherwise normal. 05/01/2018: Creatinine 1.32, eGFR 35/41, Potassium 5.1, BMP otherwise normal.   CMP Latest Ref Rng & Units 08/30/2017 08/29/2017 08/28/2017  Glucose 65 - 99 mg/dL 85 87 84  BUN 6 - 20 mg/dL 21(H) 21(H) 17  Creatinine 0.44 - 1.00 mg/dL 0.80 0.92 0.84  Sodium 135 - 145 mmol/L 137 137  137  Potassium 3.5 - 5.1 mmol/L 3.9 4.2 4.1  Chloride 101 - 111 mmol/L 104 105 104  CO2 22 - 32 mmol/L 25 26 25   Calcium 8.9 - 10.3 mg/dL 8.7(L) 9.1 8.8(L)  Total Protein 6.5 - 8.1 g/dL - - -  Total Bilirubin 0.3 - 1.2 mg/dL - - -  Alkaline Phos 38 - 126 U/L - - -  AST 15 - 41 U/L - - -  ALT 14 - 54 U/L - - -   CBC Latest Ref Rng & Units 08/25/2017 04/07/2017 07/31/2014  WBC 4.0 - 10.5 K/uL 6.0 6.5 4.7  Hemoglobin 12.0 - 15.0 g/dL 13.5 13.0 12.3  Hematocrit 36.0 - 46.0 % 41.2 40.3 38.3  Platelets 150 - 400 K/uL 237 213 193   Lipid Panel     Component Value Date/Time   CHOL 169 08/25/2017 1303   TRIG 51 08/25/2017 1303   HDL 61 08/25/2017 1303   CHOLHDL 2.8 08/25/2017 1303   VLDL 10 08/25/2017 1303   LDLCALC 98 08/25/2017 1303   HEMOGLOBIN A1C No results found for: HGBA1C, MPG TSH No results for input(s): TSH in the last 8760 hours.  PRN Meds:. There are no discontinued medications. Current Meds  Medication Sig  . B Complex-C  (B-COMPLEX WITH VITAMIN C) tablet Take 1 tablet by mouth daily.  . Cholecalciferol (VITAMIN D) 2000 units tablet Take 2,000 Units by mouth daily.   . Cyanocobalamin (VITAMIN B-12) 5000 MCG TBDP Take 5,000 mcg by mouth daily.   Marland Kitchen levothyroxine (SYNTHROID, LEVOTHROID) 75 MCG tablet Take 75 mcg by mouth daily before breakfast.   . lisinopril (PRINIVIL,ZESTRIL) 5 MG tablet Take 1 tablet (5 mg total) by mouth daily.  . Rivaroxaban (XARELTO) 15 MG TABS tablet Take 7.5 mg by mouth at bedtime.     Cardiac Studies:   Echocardiogram 03/05/2019: Left ventricle cavity is normal in size. Moderate concentric hypertrophy of the left ventricle. Moderate global hypokinesis.  Moderately depressed LV systolic function with visual EF 35-40%. Diastolic function not assessed due to possible atrial fibrillation. Calculated EF 35%. Left atrial cavity is mildly dilated. Trileaflet aortic valve. Mild aortic valve leaflet calcification. Moderate (Grade II) aortic regurgitation. Mild to moderate mitral regurgitation. Mild tricuspid regurgitation. Estimated pulmonary artery systolic pressure is 29 mmHg. Mild pulmonic regurgitation. No significant change compared to previous study on 08/26/2017.  Coronary angiogram 08/29/2017: Normal coronary arteries. Normal LV systolic function.  Direct current cardioversion 04/14/2018: A. Fibrillation: 120x1, 150x2, 150x3 to NSR.  Assessment:     ICD-10-CM   1. Atrial flutter with rapid ventricular response (HCC)  L07.86 Basic metabolic panel    Basic metabolic panel  2. Paroxysmal atrial fibrillation (HCC)  I48.0 EKG 12-Lead  3. Chronic combined systolic and diastolic heart failure (HCC)  I50.42 EKG 12-Lead  4. CKD (chronic kidney disease) stage 3, GFR 30-59 ml/min (HCC)  N18.3 EKG 12-Lead    CHA2DS2-VASc Score is 5 with yearly risk of stroke of 6.7 %. (CHF; HTN; vasc disease DM, Female = 1; Age <65 =1; 65-74 = 2, >75 =3; stroke = 3).  EKG 03/11/2019: Atrial flutter with  RVR at 138 bpm, left axis deviation, cannot exclude inferior infarct old. Poor R wave progression cannot exclude anterolateral infarct old. Compared to EKG 05/14/2018, A flutter is new.   Recommendations:   I discussed recently obtained echocardiogram results with the patient and her son and will, previously normalized LVEF has again decreased to 30 to 35%.  She was noted to possibly be  in atrial fibrillation during her echo.  EKG today shows atrial flutter with RVR at a rate of 140 bpm.  She has been having worsening shortness of breath and fatigue over the last few months.  Feel that her best option would be to proceed with cardioversion.  I will start amiodarone 200 mg twice daily and will likely decrease her dose after cardioversion.  She has had issues with bradycardia in the past.  She is currently taking Coreg 6.25 mg twice daily, will continue the same for now.    She has been continuing to take her Xarelto; however, admits to cutting it in half.  I have strongly encouraged her that she should not be cutting her prescription in half and she will need full dosage.  We will plan for cardioversion in 2 weeks to allow time being on full dose anticoagulation.  She also has a history of chronic kidney disease stage III that had previously progressed to 4 with addition of Aldactone.  She has been on Aldactone for the last few months due to confusion with her pharmacy. She appears to be tolerating this well, and despite her low EF appears to be well compensated.  We will continue with this for now.  She will have BMP performed prior to her procedure and will allow [place] to follow-up on her kidney function with being on Aldactone.  We will make needed changes at that time.  I will see her back after cardioversion for follow-up.  Encouraged him to contact me sooner if needed.   *I have discussed this case with Dr. Einar Gip and he personally examined the patient and participated in formulating the plan.*     Miquel Dunn, MSN, APRN, FNP-C [facility]. [place] Office: [phone] Fax: [phone]

## 2019-03-17 ENCOUNTER — Other Ambulatory Visit: Payer: Self-pay | Admitting: Cardiology

## 2019-03-25 DIAGNOSIS — I4892 Unspecified atrial flutter: Secondary | ICD-10-CM | POA: Diagnosis not present

## 2019-03-26 LAB — BASIC METABOLIC PANEL
BUN/Creatinine Ratio: 16 (ref 12–28)
BUN: 18 mg/dL (ref 10–36)
CO2: 24 mmol/L (ref 20–29)
Calcium: 9.4 mg/dL (ref 8.7–10.3)
Chloride: 106 mmol/L (ref 96–106)
Creatinine, Ser: 1.14 mg/dL — ABNORMAL HIGH (ref 0.57–1.00)
GFR calc Af Amer: 48 mL/min/{1.73_m2} — ABNORMAL LOW (ref >59)
GFR calc non Af Amer: 42 mL/min/{1.73_m2} — ABNORMAL LOW (ref >59)
Glucose: 54 mg/dL — ABNORMAL LOW (ref 65–99)
Potassium: 4.7 mmol/L (ref 3.5–5.2)
Sodium: 144 mmol/L (ref 134–144)

## 2019-03-31 DIAGNOSIS — M25552 Pain in left hip: Secondary | ICD-10-CM | POA: Diagnosis not present

## 2019-04-02 ENCOUNTER — Other Ambulatory Visit (HOSPITAL_COMMUNITY)
Admission: RE | Admit: 2019-04-02 | Discharge: 2019-04-02 | Disposition: A | Discharge status: Home / Self Care | Payer: Medicare Other | Source: Ambulatory Visit | Attending: Cardiology | Admitting: Cardiology

## 2019-04-02 DIAGNOSIS — Z20828 Contact with and (suspected) exposure to other viral communicable diseases: Secondary | ICD-10-CM | POA: Diagnosis not present

## 2019-04-02 DIAGNOSIS — Z01812 Encounter for preprocedural laboratory examination: Secondary | ICD-10-CM | POA: Insufficient documentation

## 2019-04-02 LAB — SARS CORONAVIRUS 2 (TAT 6-24 HRS): SARS Coronavirus 2: NEGATIVE

## 2019-04-06 ENCOUNTER — Ambulatory Visit (HOSPITAL_COMMUNITY)
Admission: RE | Admit: 2019-04-06 | Discharge: 2019-04-06 | Disposition: A | Discharge status: Home / Self Care | Payer: Medicare Other | Attending: Cardiology | Admitting: Cardiology

## 2019-04-06 ENCOUNTER — Encounter (HOSPITAL_COMMUNITY): Payer: Self-pay | Admitting: Anesthesiology

## 2019-04-06 ENCOUNTER — Encounter (HOSPITAL_COMMUNITY)
Admission: RE | Disposition: A | Discharge status: Home / Self Care | Payer: Self-pay | Source: Home / Self Care | Attending: Cardiology

## 2019-04-06 DIAGNOSIS — I4891 Unspecified atrial fibrillation: Secondary | ICD-10-CM | POA: Diagnosis not present

## 2019-04-06 DIAGNOSIS — Z5309 Procedure and treatment not carried out because of other contraindication: Secondary | ICD-10-CM | POA: Diagnosis not present

## 2019-04-06 SURGERY — CANCELLED PROCEDURE

## 2019-04-06 NOTE — Progress Notes (Signed)
Pt arrived to unit for elective cardioversion, however, pt appears to be in sinus rhythm. 12 lead EKG done. Called MD Gangi with EKG results. MD confirms NSR and states pt is okay to be discharged at this time.

## 2019-04-06 NOTE — Anesthesia Preprocedure Evaluation (Deleted)
Anesthesia Evaluation    Reviewed: Allergy & Precautions, Patient's Chart, lab work & pertinent test results  History of Anesthesia Complications Negative for: history of anesthetic complications  Airway        Dental   Pulmonary neg pulmonary ROS,           Cardiovascular +CHF  + dysrhythmias Atrial Fibrillation + Valvular Problems/Murmurs AI and MR    '20 TTE - Moderate concentric hypertrophy of the left ventricle. Moderate global hypokinesis. EF 35-40%. Left atrial cavity is mildly dilated. Moderate (Grade II) aortic regurgitation. Mild to moderate mitral regurgitation. Mild tricuspid regurgitation. Estimated pulmonary artery systolic pressure is 29 mmHg. Mild pulmonic regurgitation.    Neuro/Psych  Headaches, negative psych ROS   GI/Hepatic negative GI ROS, Neg liver ROS,   Endo/Other  Hypothyroidism   Renal/GU negative Renal ROS     Musculoskeletal  (+) Arthritis ,   Abdominal   Peds  Hematology negative hematology ROS (+)   Anesthesia Other Findings   Reproductive/Obstetrics                             Anesthesia Physical Anesthesia Plan  ASA: III  Anesthesia Plan: General   Post-op Pain Management:    Induction: Intravenous  PONV Risk Score and Plan: 3 and Treatment may vary due to age or medical condition and Propofol infusion  Airway Management Planned: Mask and Natural Airway  Additional Equipment: None  Intra-op Plan:   Post-operative Plan:   Informed Consent:   Plan Discussed with: CRNA and Anesthesiologist  Anesthesia Plan Comments:         Anesthesia Quick Evaluation

## 2019-04-07 DIAGNOSIS — R262 Difficulty in walking, not elsewhere classified: Secondary | ICD-10-CM | POA: Diagnosis not present

## 2019-04-07 DIAGNOSIS — M1612 Unilateral primary osteoarthritis, left hip: Secondary | ICD-10-CM | POA: Diagnosis not present

## 2019-04-07 DIAGNOSIS — M6281 Muscle weakness (generalized): Secondary | ICD-10-CM | POA: Diagnosis not present

## 2019-04-07 DIAGNOSIS — M25552 Pain in left hip: Secondary | ICD-10-CM | POA: Diagnosis not present

## 2019-04-08 DIAGNOSIS — E039 Hypothyroidism, unspecified: Secondary | ICD-10-CM | POA: Diagnosis not present

## 2019-04-08 DIAGNOSIS — I5022 Chronic systolic (congestive) heart failure: Secondary | ICD-10-CM | POA: Diagnosis not present

## 2019-04-08 DIAGNOSIS — I5032 Chronic diastolic (congestive) heart failure: Secondary | ICD-10-CM | POA: Diagnosis not present

## 2019-04-08 DIAGNOSIS — M25552 Pain in left hip: Secondary | ICD-10-CM | POA: Diagnosis not present

## 2019-04-08 DIAGNOSIS — I480 Paroxysmal atrial fibrillation: Secondary | ICD-10-CM | POA: Diagnosis not present

## 2019-04-08 DIAGNOSIS — I700 Atherosclerosis of aorta: Secondary | ICD-10-CM | POA: Diagnosis not present

## 2019-04-10 NOTE — H&P (Signed)
Patient scheduled for DCCV, patient in sinus rhythm. Hence procedure cancelled.

## 2019-04-13 DIAGNOSIS — M1612 Unilateral primary osteoarthritis, left hip: Secondary | ICD-10-CM | POA: Diagnosis not present

## 2019-04-13 DIAGNOSIS — M6281 Muscle weakness (generalized): Secondary | ICD-10-CM | POA: Diagnosis not present

## 2019-04-13 DIAGNOSIS — R262 Difficulty in walking, not elsewhere classified: Secondary | ICD-10-CM | POA: Diagnosis not present

## 2019-04-13 DIAGNOSIS — M25552 Pain in left hip: Secondary | ICD-10-CM | POA: Diagnosis not present

## 2019-04-14 ENCOUNTER — Ambulatory Visit: Payer: PRIVATE HEALTH INSURANCE | Admitting: Cardiology

## 2019-04-14 DIAGNOSIS — M1612 Unilateral primary osteoarthritis, left hip: Secondary | ICD-10-CM | POA: Diagnosis not present

## 2019-04-14 DIAGNOSIS — M6281 Muscle weakness (generalized): Secondary | ICD-10-CM | POA: Diagnosis not present

## 2019-04-14 DIAGNOSIS — R262 Difficulty in walking, not elsewhere classified: Secondary | ICD-10-CM | POA: Diagnosis not present

## 2019-04-14 DIAGNOSIS — M25552 Pain in left hip: Secondary | ICD-10-CM | POA: Diagnosis not present

## 2019-04-15 DIAGNOSIS — M1612 Unilateral primary osteoarthritis, left hip: Secondary | ICD-10-CM | POA: Diagnosis not present

## 2019-04-15 DIAGNOSIS — M25552 Pain in left hip: Secondary | ICD-10-CM | POA: Diagnosis not present

## 2019-04-15 DIAGNOSIS — M6281 Muscle weakness (generalized): Secondary | ICD-10-CM | POA: Diagnosis not present

## 2019-04-15 DIAGNOSIS — R262 Difficulty in walking, not elsewhere classified: Secondary | ICD-10-CM | POA: Diagnosis not present

## 2019-04-16 DIAGNOSIS — M25552 Pain in left hip: Secondary | ICD-10-CM | POA: Diagnosis not present

## 2019-04-16 DIAGNOSIS — M6281 Muscle weakness (generalized): Secondary | ICD-10-CM | POA: Diagnosis not present

## 2019-04-16 DIAGNOSIS — M1612 Unilateral primary osteoarthritis, left hip: Secondary | ICD-10-CM | POA: Diagnosis not present

## 2019-04-16 DIAGNOSIS — R262 Difficulty in walking, not elsewhere classified: Secondary | ICD-10-CM | POA: Diagnosis not present

## 2019-04-18 ENCOUNTER — Other Ambulatory Visit: Payer: Self-pay

## 2019-04-18 ENCOUNTER — Emergency Department (HOSPITAL_COMMUNITY): Payer: Medicare Other

## 2019-04-18 ENCOUNTER — Emergency Department (HOSPITAL_COMMUNITY)
Admission: EM | Admit: 2019-04-18 | Discharge: 2019-04-18 | Disposition: A | Discharge status: Home / Self Care | Payer: Medicare Other | Attending: Emergency Medicine | Admitting: Emergency Medicine

## 2019-04-18 ENCOUNTER — Encounter (HOSPITAL_COMMUNITY): Payer: Self-pay | Admitting: Emergency Medicine

## 2019-04-18 DIAGNOSIS — E039 Hypothyroidism, unspecified: Secondary | ICD-10-CM | POA: Insufficient documentation

## 2019-04-18 DIAGNOSIS — M2550 Pain in unspecified joint: Secondary | ICD-10-CM | POA: Diagnosis not present

## 2019-04-18 DIAGNOSIS — R0902 Hypoxemia: Secondary | ICD-10-CM | POA: Diagnosis not present

## 2019-04-18 DIAGNOSIS — Z7401 Bed confinement status: Secondary | ICD-10-CM | POA: Diagnosis not present

## 2019-04-18 DIAGNOSIS — Z7901 Long term (current) use of anticoagulants: Secondary | ICD-10-CM | POA: Diagnosis not present

## 2019-04-18 DIAGNOSIS — I959 Hypotension, unspecified: Secondary | ICD-10-CM | POA: Diagnosis not present

## 2019-04-18 DIAGNOSIS — Z79899 Other long term (current) drug therapy: Secondary | ICD-10-CM | POA: Insufficient documentation

## 2019-04-18 DIAGNOSIS — M25552 Pain in left hip: Secondary | ICD-10-CM | POA: Insufficient documentation

## 2019-04-18 DIAGNOSIS — M1612 Unilateral primary osteoarthritis, left hip: Secondary | ICD-10-CM | POA: Diagnosis not present

## 2019-04-18 DIAGNOSIS — R52 Pain, unspecified: Secondary | ICD-10-CM | POA: Diagnosis not present

## 2019-04-18 MED ORDER — PREDNISONE 20 MG PO TABS
40.0000 mg | ORAL_TABLET | Freq: Once | ORAL | Status: AC
  Administered 2019-04-18: 14:00:00 40 mg via ORAL
  Filled 2019-04-18: qty 2

## 2019-04-18 MED ORDER — TRAMADOL HCL 50 MG PO TABS
50.0000 mg | ORAL_TABLET | Freq: Once | ORAL | Status: DC
  Filled 2019-04-18: qty 1

## 2019-04-18 MED ORDER — PREDNISONE 20 MG PO TABS: ORAL_TABLET | ORAL | 0 refills | Status: DC

## 2019-04-18 MED ORDER — MORPHINE SULFATE (PF) 4 MG/ML IV SOLN
4.0000 mg | Freq: Once | INTRAVENOUS | Status: AC
  Administered 2019-04-18: 4 mg via INTRAMUSCULAR
  Filled 2019-04-18: qty 1

## 2019-04-18 MED ORDER — OXYCODONE-ACETAMINOPHEN 5-325 MG PO TABS: 1.0000 | ORAL_TABLET | Freq: Four times a day (QID) | ORAL | 0 refills | Status: AC | PRN

## 2019-04-18 MED ORDER — OXYCODONE-ACETAMINOPHEN 5-325 MG PO TABS
1.0000 | ORAL_TABLET | Freq: Once | ORAL | Status: AC
  Administered 2019-04-18: 1 via ORAL
  Filled 2019-04-18: qty 1

## 2019-04-18 NOTE — ED Triage Notes (Signed)
Pt BIB EMS from [facility] for worsening L hip pain for the past week. Pt has a hx of hip pain, no recent fall/trauma, no bruising or deformity. Pt normally gets injections in hip, last 8/12, says pain has been worsening and radiating down into leg.

## 2019-04-18 NOTE — ED Provider Notes (Signed)
[facility] EMERGENCY DEPARTMENT Provider Note   CSN: [id number] Arrival date & time: 04/18/19  1111     History   Chief Complaint Chief Complaint  Patient presents with  . Hip Pain    HPI Madison Marquez is a 85 y.o. female.     Patient c/o left hip pain for the past several weeks. Pain gradual onset, constant, dull, mod-severe, worse w walking. Pain occasionally radiates down right leg. States has received prior injections to hip area for the same in the past with only mild improvement. Denies back pain. No numbness or weakness. No leg swelling. Denies trauma or fall. No fever or chills.   The history is provided by the patient.  Hip Pain Pertinent negatives include no chest pain, no abdominal pain, no headaches and no shortness of breath.    Past Medical History:  Diagnosis Date  . Acute CHF (congestive heart failure) ([place]) 08/25/2017  . Arthritis    "probably in my right knee" (08/25/2017)  . [facility]ell Patient infection [phone]  . Hypercholesterolemia   . Hypothyroid   . Migraine    "I've had 1 in my lifetime" (08/25/2017)    Patient Active Problem List   Diagnosis Date Noted  . Atrial fibrillation ([place]) 04/13/2018  . Hypercholesterolemia 08/25/2017  . Hypothyroid 08/25/2017    Past Surgical History:  Procedure Laterality Date  . ABDOMINAL HYSTERECTOMY    . ANKLE FRACTURE SURGERY Right   . BLADDER SUSPENSION    . BREAST LUMPECTOMY Left   . BREAST SURGERY Left    "leaky nipple"  . CARDIOVERSION N/A 04/14/2018   Procedure: CARDIOVERSION;  Surgeon: Adrian Prows, MD;  Location: [facility] ENDOSCOPY;  Service: Cardiovascular;  Laterality: N/A;  . CARDIOVERSION N/A 05/05/2018   Procedure: CARDIOVERSION;  Surgeon: Nigel Mormon, MD;  Location: [facility] ENDOSCOPY;  Service: Cardiovascular;  Laterality: N/A;  . CARPAL TUNNEL RELEASE Left 10/15/2016   Procedure: LEFT CARPAL TUNNEL RELEASE;  Surgeon: Daryll Brod, MD;  Location: [place];  Service:  Orthopedics;  Laterality: Left;  . CATARACT EXTRACTION W/ INTRAOCULAR LENS  IMPLANT, BILATERAL Bilateral 2017  . FRACTURE SURGERY    . HERNIA REPAIR    . LEFT HEART CATH AND CORONARY ANGIOGRAPHY N/A 08/29/2017   Procedure: LEFT HEART CATH AND CORONARY ANGIOGRAPHY;  Surgeon: Adrian Prows, MD;  Location: [place] CV LAB;  Service: Cardiovascular;  Laterality: N/A;  . UMBILICAL HERNIA REPAIR       OB History    Gravida  2   Para  2   Term      Preterm      AB      Living        SAB      TAB      Ectopic      Multiple      Live Births               Home Medications    Prior to Admission medications   Medication Sig Start Date End Date Taking? Authorizing Provider  amiodarone (PACERONE) 200 MG tablet Take 1 tablet (200 mg total) by mouth 2 (two) times daily. 03/11/19   Miquel Dunn, NP  B Complex-C (B-COMPLEX WITH VITAMIN C) tablet Take 1 tablet by mouth daily.    [provider]  carvedilol (COREG) 6.25 MG tablet Take 1 tablet (6.25 mg total) by mouth 2 (two) times daily with a meal. 08/30/17   Bonnell Public, MD  Cholecalciferol (VITAMIN  D) 2000 units tablet Take 2,000 Units by mouth daily.     [provider]  Cyanocobalamin (VITAMIN B-12) 5000 MCG TBDP Take 5,000 mcg by mouth daily.     [provider]  levothyroxine (SYNTHROID, LEVOTHROID) 75 MCG tablet Take 75 mcg by mouth daily before breakfast.     [provider]  lisinopril (PRINIVIL,ZESTRIL) 5 MG tablet Take 1 tablet (5 mg total) by mouth daily. Patient not taking: Reported on 04/02/2019 08/31/17   Dana Allan I, MD  Rivaroxaban (XARELTO) 15 MG TABS tablet Take 15 mg by mouth daily with supper.     [provider]  spironolactone (ALDACTONE) 25 MG tablet TAKE 1/2 TABLET BY MOUTH DAILY 03/17/19   Adrian Prows, MD    Family History Family History  Problem Relation Age of Onset  . Heart failure Brother   . Diabetes Brother   . Cancer Sister      Social History Social History   Tobacco Use  . Smoking status: Never Smoker  . Smokeless tobacco: Never Used  Substance Use Topics  . Alcohol use: No  . Drug use: No     Allergies   Patient has no known allergies.   Review of Systems Review of Systems  Constitutional: Negative for fever.  HENT: Negative for sore throat.   Eyes: Negative for redness.  Respiratory: Negative for shortness of breath.   Cardiovascular: Negative for chest pain.  Gastrointestinal: Negative for abdominal pain.  Genitourinary: Negative for flank pain.  Musculoskeletal: Negative for back pain.  Skin: Negative for rash.  Neurological: Negative for weakness, numbness and headaches.  Hematological: Does not bruise/bleed easily.  Psychiatric/Behavioral: Negative for confusion.     Physical Exam Updated Vital Signs BP (!) 141/72 (BP Location: Right Arm)   Pulse 78   Temp (!) 97.5 F (36.4 C) (Oral)   Resp 16   Ht 1.626 m (5\' 4" )   Wt 72 kg   SpO2 99%   BMI 27.25 kg/m   Physical Exam Vitals signs and nursing note reviewed.  Constitutional:      Appearance: Normal appearance. She is well-developed.  HENT:     Head: Atraumatic.     Nose: Nose normal.     Mouth/Throat:     Mouth: Mucous membranes are moist.  Eyes:     General: No scleral icterus.    Conjunctiva/sclera: Conjunctivae normal.  Neck:     Musculoskeletal: Normal range of motion and neck supple. No neck rigidity or muscular tenderness.     Trachea: No tracheal deviation.  Cardiovascular:     Rate and Rhythm: Normal rate.     Pulses: Normal pulses.  Pulmonary:     Effort: Pulmonary effort is normal. No respiratory distress.  Abdominal:     General: There is no distension.     Palpations: Abdomen is soft.     Tenderness: There is no abdominal tenderness.  Genitourinary:    Comments: No cva tenderness.  Musculoskeletal:        General: No swelling.     Right lower leg: No edema.     Left lower leg: No edema.      Comments: Pain w active movement of left hip. With slow, passive rom hip, minimal discomfort. No pain w rom knee. Distal pulses palp bil. L/S spine non tender, aligned. No leg swelling. No shingles lesions in area of pain. Patient does have two small, 1 cm blisters to skin on lateral aspect hip - states she was using  heating pad on high setting and thinks it got too hot.   Skin:    General: Skin is warm and dry.     Findings: No rash.  Neurological:     Mental Status: She is alert.     Comments: Alert, speech normal. LLE motor intact, str 5/5. sens grossly intact.   Psychiatric:        Mood and Affect: Mood normal.      ED Treatments / Results  Labs (all labs ordered are listed, but only abnormal results are displayed) Labs Reviewed - No data to display  EKG None  Radiology Dg Hip Unilat W Or W/o Pelvis 2-3 Views Left  Result Date: 04/18/2019 CLINICAL DATA:  Left hip pain x1 week EXAM: DG HIP (WITH OR WITHOUT PELVIS) 2-3V LEFT COMPARISON:  None. FINDINGS: No fracture or dislocation is seen. Moderate degenerative changes of the left hip. Mild degenerative changes of the right hip. Visualized bony pelvis appears intact. Mild degenerative changes of the lower lumbar spine. IMPRESSION: No fracture or dislocation is seen. Moderate degenerative changes of the left hip. Mild degenerative changes of the right hip. Electronically Signed   By: Julian Hy M.D.   On: 04/18/2019 13:15    Procedures Procedures (including critical care time)  Medications Ordered in ED Medications  morphine 4 MG/ML injection 4 mg (has no administration in time range)     Initial Impression / Assessment and Plan / ED Course  I have reviewed the triage vital signs and the nursing notes.  Pertinent labs & imaging results that were available during my care of the patient were reviewed by me and considered in my medical decision making (see chart for details).  Pt requests something for pain/shot. Morphine  im.   Xrays.   Reviewed nursing notes and prior charts for additional history.   xrays reviewed by me - no fx.   Given radiating nature to symptoms, will try course of prednisone. Prednisone po.   Ultram ordered - pt states recently had at home, and didn't help - pt requests other, 'stronger' medication. Percocet 1 po.  Pt appears comfortable and in no apparent distress, and appears stable for d/c.     Final Clinical Impressions(s) / ED Diagnoses   Final diagnoses:  None    ED Discharge Orders    None       Lajean Saver, MD 04/18/19 [id number]

## 2019-04-18 NOTE — Discharge Instructions (Signed)
It was our pleasure to provide your ER care today - we hope that you feel better.  Take prednisone as prescribed.   You may take percocet as need for pain - no driving when taking.   If applying heat/heating pad to area, use low to medium setting so as to avoid burning your skin.   Follow up with your doctor/orthopedist in the coming week - call office tomorrow AM to arrange follow up with them.   Return to ER if worse, new symptoms, fevers, leg numbness/weakness, other concern.

## 2019-04-18 NOTE — ED Notes (Signed)
Patient transported to X-ray 

## 2019-04-19 DIAGNOSIS — R262 Difficulty in walking, not elsewhere classified: Secondary | ICD-10-CM | POA: Diagnosis not present

## 2019-04-19 DIAGNOSIS — M25552 Pain in left hip: Secondary | ICD-10-CM | POA: Diagnosis not present

## 2019-04-19 DIAGNOSIS — M1612 Unilateral primary osteoarthritis, left hip: Secondary | ICD-10-CM | POA: Diagnosis not present

## 2019-04-19 DIAGNOSIS — M6281 Muscle weakness (generalized): Secondary | ICD-10-CM | POA: Diagnosis not present

## 2019-04-21 DIAGNOSIS — M25552 Pain in left hip: Secondary | ICD-10-CM | POA: Diagnosis not present

## 2019-04-21 DIAGNOSIS — R262 Difficulty in walking, not elsewhere classified: Secondary | ICD-10-CM | POA: Diagnosis not present

## 2019-04-21 DIAGNOSIS — M6281 Muscle weakness (generalized): Secondary | ICD-10-CM | POA: Diagnosis not present

## 2019-04-21 DIAGNOSIS — M1612 Unilateral primary osteoarthritis, left hip: Secondary | ICD-10-CM | POA: Diagnosis not present

## 2019-04-22 DIAGNOSIS — M25552 Pain in left hip: Secondary | ICD-10-CM | POA: Diagnosis not present

## 2019-04-27 DIAGNOSIS — M1612 Unilateral primary osteoarthritis, left hip: Secondary | ICD-10-CM | POA: Diagnosis not present

## 2019-04-27 DIAGNOSIS — M25552 Pain in left hip: Secondary | ICD-10-CM | POA: Diagnosis not present

## 2019-04-27 DIAGNOSIS — R262 Difficulty in walking, not elsewhere classified: Secondary | ICD-10-CM | POA: Diagnosis not present

## 2019-04-27 DIAGNOSIS — M6281 Muscle weakness (generalized): Secondary | ICD-10-CM | POA: Diagnosis not present

## 2019-04-28 DIAGNOSIS — M1612 Unilateral primary osteoarthritis, left hip: Secondary | ICD-10-CM | POA: Diagnosis not present

## 2019-04-28 DIAGNOSIS — M25552 Pain in left hip: Secondary | ICD-10-CM | POA: Diagnosis not present

## 2019-04-28 DIAGNOSIS — M6281 Muscle weakness (generalized): Secondary | ICD-10-CM | POA: Diagnosis not present

## 2019-04-28 DIAGNOSIS — R262 Difficulty in walking, not elsewhere classified: Secondary | ICD-10-CM | POA: Diagnosis not present

## 2019-04-29 DIAGNOSIS — R262 Difficulty in walking, not elsewhere classified: Secondary | ICD-10-CM | POA: Diagnosis not present

## 2019-04-29 DIAGNOSIS — M1612 Unilateral primary osteoarthritis, left hip: Secondary | ICD-10-CM | POA: Diagnosis not present

## 2019-04-29 DIAGNOSIS — M25552 Pain in left hip: Secondary | ICD-10-CM | POA: Diagnosis not present

## 2019-04-29 DIAGNOSIS — M6281 Muscle weakness (generalized): Secondary | ICD-10-CM | POA: Diagnosis not present

## 2019-05-02 ENCOUNTER — Other Ambulatory Visit: Payer: Self-pay | Admitting: Cardiology

## 2019-05-04 DIAGNOSIS — M6281 Muscle weakness (generalized): Secondary | ICD-10-CM | POA: Diagnosis not present

## 2019-05-04 DIAGNOSIS — M1612 Unilateral primary osteoarthritis, left hip: Secondary | ICD-10-CM | POA: Diagnosis not present

## 2019-05-04 DIAGNOSIS — R262 Difficulty in walking, not elsewhere classified: Secondary | ICD-10-CM | POA: Diagnosis not present

## 2019-05-04 DIAGNOSIS — M25552 Pain in left hip: Secondary | ICD-10-CM | POA: Diagnosis not present

## 2019-05-06 DIAGNOSIS — M1612 Unilateral primary osteoarthritis, left hip: Secondary | ICD-10-CM | POA: Diagnosis not present

## 2019-05-06 DIAGNOSIS — M25552 Pain in left hip: Secondary | ICD-10-CM | POA: Diagnosis not present

## 2019-05-06 DIAGNOSIS — M6281 Muscle weakness (generalized): Secondary | ICD-10-CM | POA: Diagnosis not present

## 2019-05-06 DIAGNOSIS — R262 Difficulty in walking, not elsewhere classified: Secondary | ICD-10-CM | POA: Diagnosis not present

## 2019-05-11 DIAGNOSIS — M1612 Unilateral primary osteoarthritis, left hip: Secondary | ICD-10-CM | POA: Diagnosis not present

## 2019-05-11 DIAGNOSIS — M6281 Muscle weakness (generalized): Secondary | ICD-10-CM | POA: Diagnosis not present

## 2019-05-11 DIAGNOSIS — M25552 Pain in left hip: Secondary | ICD-10-CM | POA: Diagnosis not present

## 2019-05-11 DIAGNOSIS — R262 Difficulty in walking, not elsewhere classified: Secondary | ICD-10-CM | POA: Diagnosis not present

## 2019-05-12 ENCOUNTER — Other Ambulatory Visit: Payer: Self-pay

## 2019-05-12 ENCOUNTER — Telehealth: Payer: Self-pay

## 2019-05-12 DIAGNOSIS — R262 Difficulty in walking, not elsewhere classified: Secondary | ICD-10-CM | POA: Diagnosis not present

## 2019-05-12 DIAGNOSIS — M6281 Muscle weakness (generalized): Secondary | ICD-10-CM | POA: Diagnosis not present

## 2019-05-12 DIAGNOSIS — M1612 Unilateral primary osteoarthritis, left hip: Secondary | ICD-10-CM | POA: Diagnosis not present

## 2019-05-12 DIAGNOSIS — M25552 Pain in left hip: Secondary | ICD-10-CM | POA: Diagnosis not present

## 2019-05-12 MED ORDER — AMIODARONE HCL 200 MG PO TABS: 200.0000 mg | ORAL_TABLET | Freq: Every day | ORAL | 3 refills | Status: DC

## 2019-05-12 NOTE — Telephone Encounter (Signed)
Can I refill her Amiodarone

## 2019-05-12 NOTE — Telephone Encounter (Signed)
Should be on Amiodarone 200 mg daily not BID

## 2019-05-12 NOTE — Telephone Encounter (Signed)
OK SENT CORRECTED PRESCRIPTION

## 2019-05-12 NOTE — Telephone Encounter (Signed)
Left VM for pt to make apt.also [facility] will call pt as well

## 2019-05-12 NOTE — Telephone Encounter (Signed)
She needs an appt also to come back and see me. Looks like it was missed as she didn't have the cardioversion

## 2019-05-18 ENCOUNTER — Other Ambulatory Visit: Payer: Self-pay | Admitting: Cardiology

## 2019-05-18 DIAGNOSIS — M25552 Pain in left hip: Secondary | ICD-10-CM | POA: Diagnosis not present

## 2019-05-18 DIAGNOSIS — M1612 Unilateral primary osteoarthritis, left hip: Secondary | ICD-10-CM | POA: Diagnosis not present

## 2019-05-18 DIAGNOSIS — M6281 Muscle weakness (generalized): Secondary | ICD-10-CM | POA: Diagnosis not present

## 2019-05-18 DIAGNOSIS — R262 Difficulty in walking, not elsewhere classified: Secondary | ICD-10-CM | POA: Diagnosis not present

## 2019-05-19 DIAGNOSIS — Z119 Encounter for screening for infectious and parasitic diseases, unspecified: Secondary | ICD-10-CM | POA: Diagnosis not present

## 2019-05-20 DIAGNOSIS — R262 Difficulty in walking, not elsewhere classified: Secondary | ICD-10-CM | POA: Diagnosis not present

## 2019-05-20 DIAGNOSIS — M1612 Unilateral primary osteoarthritis, left hip: Secondary | ICD-10-CM | POA: Diagnosis not present

## 2019-05-20 DIAGNOSIS — M6281 Muscle weakness (generalized): Secondary | ICD-10-CM | POA: Diagnosis not present

## 2019-05-20 DIAGNOSIS — M25552 Pain in left hip: Secondary | ICD-10-CM | POA: Diagnosis not present

## 2019-05-26 DIAGNOSIS — M1612 Unilateral primary osteoarthritis, left hip: Secondary | ICD-10-CM | POA: Diagnosis not present

## 2019-05-26 DIAGNOSIS — M25552 Pain in left hip: Secondary | ICD-10-CM | POA: Diagnosis not present

## 2019-05-26 DIAGNOSIS — M6281 Muscle weakness (generalized): Secondary | ICD-10-CM | POA: Diagnosis not present

## 2019-05-26 DIAGNOSIS — R262 Difficulty in walking, not elsewhere classified: Secondary | ICD-10-CM | POA: Diagnosis not present

## 2019-05-27 ENCOUNTER — Telehealth: Payer: Self-pay

## 2019-05-27 NOTE — Telephone Encounter (Signed)
Telephone encounter:  Reason for call: Pt daughter called to inform us that pt ankle is swollen and would like to know should she come in sooner. Please advice thank you  Usual provider: DR. Lajean Manes  Last office visit: 03/11/2019  Next office visit: 06/08/2019   Last hospitalization: 04/18/2019   Current Outpatient Medications on File Prior to Visit  Medication Sig Dispense Refill  . amiodarone (PACERONE) 200 MG tablet Take 1 tablet (200 mg total) by mouth daily. 60 tablet 3  . B Complex-C (B-COMPLEX WITH VITAMIN C) tablet Take 1 tablet by mouth daily.    . carvedilol (COREG) 6.25 MG tablet Take 1 tablet (6.25 mg total) by mouth 2 (two) times daily with a meal. 60 tablet 0  . Cholecalciferol (VITAMIN D) 2000 units tablet Take 2,000 Units by mouth daily.     . Cyanocobalamin (VITAMIN B-12) 5000 MCG TBDP Take 5,000 mcg by mouth daily.     Marland Kitchen levothyroxine (SYNTHROID, LEVOTHROID) 75 MCG tablet Take 75 mcg by mouth daily before breakfast.     . lisinopril (PRINIVIL,ZESTRIL) 5 MG tablet Take 1 tablet (5 mg total) by mouth daily. (Patient not taking: Reported on 04/02/2019) 30 tablet 0  . oxyCODONE-acetaminophen (PERCOCET/ROXICET) 5-325 MG tablet Take 1 tablet by mouth every 6 (six) hours as needed for severe pain. 15 tablet 0  . predniSONE (DELTASONE) 20 MG tablet 2 po once a day for 3 days, then 1 po once a day for 3 days 9 tablet 0  . spironolactone (ALDACTONE) 25 MG tablet TAKE 1/2 TABLET BY MOUTH DAILY 30 tablet 0  . XARELTO 15 MG TABS tablet TAKE 1 TABLET BY MOUTH EVERY EVENING AFTER DINNER 30 tablet 1   No current facility-administered medications on file prior to visit.

## 2019-05-27 NOTE — Telephone Encounter (Signed)
Does she have any lasix that she use to take?

## 2019-05-28 ENCOUNTER — Other Ambulatory Visit: Payer: Self-pay

## 2019-05-28 MED ORDER — FUROSEMIDE 40 MG PO TABS: 40.0000 mg | ORAL_TABLET | Freq: Every day | ORAL | 0 refills | Status: DC

## 2019-05-28 NOTE — Telephone Encounter (Signed)
Pt daughter called back due to not hearing back from [place]. I verbally discussed with MP due AK being out of office and he said to send in Lasix 40mg  qd #15. //ah

## 2019-05-28 NOTE — Telephone Encounter (Signed)
Called pt daughter and she informed me that she does not have any lasix left.

## 2019-06-08 ENCOUNTER — Ambulatory Visit (INDEPENDENT_AMBULATORY_CARE_PROVIDER_SITE_OTHER): Payer: Medicare Other | Admitting: Cardiology

## 2019-06-08 ENCOUNTER — Encounter: Payer: Self-pay | Admitting: Cardiology

## 2019-06-08 ENCOUNTER — Other Ambulatory Visit: Payer: Self-pay

## 2019-06-08 VITALS — BP 117/59 | HR 67 | Temp 98.1°F | Ht 64.0 in | Wt 145.0 lb

## 2019-06-08 DIAGNOSIS — N1831 Chronic kidney disease, stage 3a: Secondary | ICD-10-CM

## 2019-06-08 DIAGNOSIS — Z79899 Other long term (current) drug therapy: Secondary | ICD-10-CM | POA: Diagnosis not present

## 2019-06-08 DIAGNOSIS — I480 Paroxysmal atrial fibrillation: Secondary | ICD-10-CM

## 2019-06-08 DIAGNOSIS — D649 Anemia, unspecified: Secondary | ICD-10-CM | POA: Diagnosis not present

## 2019-06-08 DIAGNOSIS — I5042 Chronic combined systolic (congestive) and diastolic (congestive) heart failure: Secondary | ICD-10-CM | POA: Diagnosis not present

## 2019-06-08 DIAGNOSIS — G894 Chronic pain syndrome: Secondary | ICD-10-CM | POA: Diagnosis not present

## 2019-06-08 DIAGNOSIS — M25552 Pain in left hip: Secondary | ICD-10-CM | POA: Diagnosis not present

## 2019-06-08 DIAGNOSIS — Z23 Encounter for immunization: Secondary | ICD-10-CM | POA: Diagnosis not present

## 2019-06-08 DIAGNOSIS — E039 Hypothyroidism, unspecified: Secondary | ICD-10-CM | POA: Diagnosis not present

## 2019-06-08 NOTE — Progress Notes (Signed)
Primary Physician:  Lajean Manes, MD   Patient ID: Madison Marquez, female    DOB: [birth year], 85 y.o.   MRN: [id number]  Subjective:    Chief Complaint  Patient presents with   Leg Swelling   PAF   Follow-up    HPI: Madison Marquez  is a 85 y.o. female  with chronic diastolic heart failure, CKD stage 3, hypertension, hypothyroidism, and paroxysmal atrial flutter and paroxysmal atrial fibrillation.  Patient was admitted in Jan 2019 with acute decompensated heart failure. Echocardiogram revealed moderate LV systolic dysfunction, EF 01%. She was diuresed, and eventually underwent left heart catheterization due to atypical chest pain and LV systolic dysfunction, this revealed normal coronary arteries and low normal LVEF. Patient underwent successful cardioversion on 04/14/2018; however, on follow up was in atrial flutter. Due to recurrent atrial fibrillation, underwent cardioversion x 2 on 05/05/2018 that continued to show intermittent slow A. fib and sinus bradycardia with multifocal PACs and given hypotension, bradycardia, and suspicion of sinus node dysfunction further attempts at cardioversion were not attempted. She has had bradycardia with higher doses of Coreg. By echocardiogram in May 2019, LVEF had normalized with maintaining sinus rhythm.   Patient was last seen in July with complaints of shortness of breath and fatigue, found to be in atrial flutter with RVR, she was started back on amiodarone and set up for cardioversion.  When she presented for cardioversion, she was found to be back in sinus rhythm.  She was lost to follow-up.  She recently called our office with complaints of leg swelling and follow-up appointment was made.  She was started back on Lasix that improved her leg swelling. She denies any shortness of breath, except for if she was to over exert herself. She does mention fatigue. She feels as though she is depressed due to isolation and not doing much. States that  her left hip pain significantly limits her activities. She is tolerating medications well. No bleeding. NO heart racing or palpitations.   Past Medical History:  Diagnosis Date   Acute CHF (congestive heart failure) ([facility]) 08/25/2017   Arthritis    "probably in my right knee" (08/25/2017)   [facility] Patient infection 1950s   Hypercholesterolemia    Hypothyroid    Migraine    "I've had 1 in my lifetime" (08/25/2017)    Past Surgical History:  Procedure Laterality Date   ABDOMINAL HYSTERECTOMY     ANKLE FRACTURE SURGERY Right    BLADDER SUSPENSION     BREAST LUMPECTOMY Left    BREAST SURGERY Left    "leaky nipple"   CARDIOVERSION N/A 04/14/2018   Procedure: CARDIOVERSION;  Surgeon: Adrian Prows, MD;  Location: [facility];  Service: Cardiovascular;  Laterality: N/A;   CARDIOVERSION N/A 05/05/2018   Procedure: CARDIOVERSION;  Surgeon: Nigel Mormon, MD;  Location: [facility];  Service: Cardiovascular;  Laterality: N/A;   CARPAL TUNNEL RELEASE Left 10/15/2016   Procedure: LEFT CARPAL TUNNEL RELEASE;  Surgeon: Daryll Brod, MD;  Location: [facility];  Service: Orthopedics;  Laterality: Left;   CATARACT EXTRACTION W/ INTRAOCULAR LENS  IMPLANT, BILATERAL Bilateral 2017   FRACTURE SURGERY     HERNIA REPAIR     LEFT HEART CATH AND CORONARY ANGIOGRAPHY N/A 08/29/2017   Procedure: LEFT HEART CATH AND CORONARY ANGIOGRAPHY;  Surgeon: Adrian Prows, MD;  Location: [facility];  Service: Cardiovascular;  Laterality: N/A;   UMBILICAL HERNIA REPAIR      Social History   Socioeconomic History  Marital status: Widowed    Spouse name: Not on file   Number of children: 2   Years of education: Not on file   Highest education level: Not on file  Occupational History   Not on file  Social Needs   Financial resource strain: Not on file   Food insecurity    Worry: Not on file    Inability: Not on file   Transportation needs    Medical: Not on file      Non-medical: Not on file  Tobacco Use   Smoking status: Never Smoker   Smokeless tobacco: Never Used  Substance and Sexual Activity   Alcohol use: No   Drug use: No   Sexual activity: Not on file  Lifestyle   Physical activity    Days per week: Not on file    Minutes per session: Not on file   Stress: Not on file  Relationships   Social connections    Talks on phone: Not on file    Gets together: Not on file    Attends religious service: Not on file    Active member of club or organization: Not on file    Attends meetings of clubs or organizations: Not on file    Relationship status: Not on file   Intimate partner violence    Fear of current or ex partner: Not on file    Emotionally abused: Not on file    Physically abused: Not on file    Forced sexual activity: Not on file  Other Topics Concern   Not on file  Social History Narrative   Not on file    Review of Systems  Constitution: Positive for malaise/fatigue. Negative for decreased appetite, weight gain and weight loss.  Eyes: Negative for visual disturbance.  Cardiovascular: Positive for dyspnea on exertion. Negative for chest pain, claudication, leg swelling, orthopnea, palpitations and syncope.  Respiratory: Negative for hemoptysis and wheezing.   Endocrine: Negative for cold intolerance and heat intolerance.  Hematologic/Lymphatic: Does not bruise/bleed easily.  Skin: Negative for nail changes.  Musculoskeletal: Positive for joint pain (left hip). Negative for muscle weakness and myalgias.  Gastrointestinal: Negative for abdominal pain, change in bowel habit, nausea and vomiting.  Neurological: Negative for difficulty with concentration, dizziness, focal weakness and headaches.  Psychiatric/Behavioral: Negative for altered mental status and suicidal ideas.  All other systems reviewed and are negative.     Objective:  Blood pressure (!) 117/59, pulse 67, temperature 98.1 F (36.7 C), height 5' 4"   (1.626 m), weight 145 lb (65.8 kg), SpO2 93 %. Body mass index is 24.89 kg/m.    Physical Exam  Constitutional: She is oriented to person, place, and time. Vital signs are normal. She appears well-developed and well-nourished.  HENT:  Head: Normocephalic and atraumatic.  Neck: Normal range of motion.  Cardiovascular: Normal rate, regular rhythm, normal heart sounds and intact distal pulses.  Pulmonary/Chest: Effort normal and breath sounds normal. No accessory muscle usage. No respiratory distress.  Abdominal: Soft. Bowel sounds are normal.  Musculoskeletal: Normal range of motion.  Neurological: She is alert and oriented to person, place, and time.  Skin: Skin is warm and dry.  Vitals reviewed.  Radiology: No results found.  Laboratory examination:   06/08/2018: Creatinine 1.02, EGFR 48/55, potassium 4.6, BMP otherwise normal. 05/01/2018: Creatinine 1.32, eGFR 35/41, Potassium 5.1, BMP otherwise normal.   CMP Latest Ref Rng & Units 03/25/2019 08/30/2017 08/29/2017  Glucose 65 - 99 mg/dL 54(L) 85 87  BUN  10 - 36 mg/dL 18 21(H) 21(H)  Creatinine 0.57 - 1.00 mg/dL 1.14(H) 0.80 0.92  Sodium 134 - 144 mmol/L 144 137 137  Potassium 3.5 - 5.2 mmol/L 4.7 3.9 4.2  Chloride 96 - 106 mmol/L 106 104 105  CO2 20 - 29 mmol/L 24 25 26   Calcium 8.7 - 10.3 mg/dL 9.4 8.7(L) 9.1  Total Protein 6.5 - 8.1 g/dL - - -  Total Bilirubin 0.3 - 1.2 mg/dL - - -  Alkaline Phos 38 - 126 U/L - - -  AST 15 - 41 U/L - - -  ALT 14 - 54 U/L - - -   CBC Latest Ref Rng & Units 08/25/2017 04/07/2017 07/31/2014  WBC 4.0 - 10.5 K/uL 6.0 6.5 4.7  Hemoglobin 12.0 - 15.0 g/dL 13.5 13.0 12.3  Hematocrit 36.0 - 46.0 % 41.2 40.3 38.3  Platelets 150 - 400 K/uL 237 213 193   Lipid Panel     Component Value Date/Time   CHOL 169 08/25/2017 1303   TRIG 51 08/25/2017 1303   HDL 61 08/25/2017 1303   CHOLHDL 2.8 08/25/2017 1303   VLDL 10 08/25/2017 1303   LDLCALC 98 08/25/2017 1303   HEMOGLOBIN A1C No results found for:  HGBA1C, MPG TSH No results for input(s): TSH in the last 8760 hours.  PRN Meds:. Medications Discontinued During This Encounter  Medication Reason   B Complex-C (B-COMPLEX WITH VITAMIN C) tablet Error   Current Meds  Medication Sig   amiodarone (PACERONE) 200 MG tablet Take 1 tablet (200 mg total) by mouth daily.   carvedilol (COREG) 6.25 MG tablet Take 1 tablet (6.25 mg total) by mouth 2 (two) times daily with a meal.   Cholecalciferol (VITAMIN D) 2000 units tablet Take 2,000 Units by mouth daily.    Cyanocobalamin (VITAMIN B-12) 5000 MCG TBDP Take 5,000 mcg by mouth daily.    furosemide (LASIX) 40 MG tablet Take 1 tablet (40 mg total) by mouth daily for 15 days.   levothyroxine (SYNTHROID, LEVOTHROID) 75 MCG tablet Take 75 mcg by mouth daily before breakfast.    oxyCODONE-acetaminophen (PERCOCET/ROXICET) 5-325 MG tablet Take 1 tablet by mouth every 6 (six) hours as needed for severe pain.   predniSONE (DELTASONE) 20 MG tablet 2 po once a day for 3 days, then 1 po once a day for 3 days   spironolactone (ALDACTONE) 25 MG tablet TAKE 1/2 TABLET BY MOUTH DAILY   XARELTO 15 MG TABS tablet TAKE 1 TABLET BY MOUTH EVERY EVENING AFTER DINNER    Cardiac Studies:   Echocardiogram 03/05/2019: Left ventricle cavity is normal in size. Moderate concentric hypertrophy of the left ventricle. Moderate global hypokinesis.  Moderately depressed LV systolic function with visual EF 35-40%. Diastolic function not assessed due to possible atrial fibrillation. Calculated EF 35%. Left atrial cavity is mildly dilated. Trileaflet aortic valve. Mild aortic valve leaflet calcification. Moderate (Grade II) aortic regurgitation. Mild to moderate mitral regurgitation. Mild tricuspid regurgitation. Estimated pulmonary artery systolic pressure is 29 mmHg. Mild pulmonic regurgitation. No significant change compared to previous study on 08/26/2017.  Coronary angiogram 08/29/2017: Normal coronary arteries.  Normal LV systolic function.  Direct current cardioversion 04/14/2018: A. Fibrillation: 120x1, 150x2, 150x3 to NSR.  Assessment:     ICD-10-CM   1. Paroxysmal atrial fibrillation (HCC)  I48.0 EKG 12-Lead  2. Chronic combined systolic and diastolic heart failure (HCC)  I50.42   3. Stage 3a chronic kidney disease  N18.31     CHA2DS2-VASc Score is 5 with yearly  risk of stroke of 6.7 %. (CHF; HTN; vasc disease DM, Female = 1; Age <65 =1; 65-74 = 2, >75 =3; stroke = 3).  EKG 03/11/2019: Atrial flutter with RVR at 138 bpm, left axis deviation, cannot exclude inferior infarct old. Poor R wave progression cannot exclude anterolateral infarct old. Compared to EKG 05/14/2018, A flutter is new.   Recommendations:   Patient is overall doing well since last seen by [place].  She is noted to be an ectopic atrial rhythm, but is essentially asymptomatic.  She does have some fatigue.  I suspect this may be more so related to depression and inactivity, she seems to be depressed surrounding isolation.  I have encouraged her to discuss with her PCP this afternoon at her appointment.  We will continue with amiodarone 200 mg daily for now.  Blood pressure is well controlled.  She is on Xarelto 15 mg daily, no bleeding diathesis reported.  Has chronic kidney disease stage III that has overall been stable.  She is due for blood work, will obtain CBC and BMP for surveillance.  Leg edema has resolved with use of Lasix, she will continue to use on an as-needed basis.  No clinical evidence of heart failure on exam today.  I will see her back in 3 months for follow-up.    Miquel Dunn, MSN, APRN, FNP-C [facility]. [place] Office: [phone] Fax: [phone]

## 2019-06-11 ENCOUNTER — Other Ambulatory Visit: Payer: Self-pay

## 2019-06-17 ENCOUNTER — Other Ambulatory Visit: Payer: Self-pay | Admitting: Cardiology

## 2019-07-13 DIAGNOSIS — N1832 Chronic kidney disease, stage 3b: Secondary | ICD-10-CM | POA: Diagnosis not present

## 2019-07-13 DIAGNOSIS — R600 Localized edema: Secondary | ICD-10-CM | POA: Diagnosis not present

## 2019-07-13 DIAGNOSIS — G894 Chronic pain syndrome: Secondary | ICD-10-CM | POA: Diagnosis not present

## 2019-07-13 DIAGNOSIS — D509 Iron deficiency anemia, unspecified: Secondary | ICD-10-CM | POA: Diagnosis not present

## 2019-07-13 DIAGNOSIS — I5022 Chronic systolic (congestive) heart failure: Secondary | ICD-10-CM | POA: Diagnosis not present

## 2019-07-13 DIAGNOSIS — I5032 Chronic diastolic (congestive) heart failure: Secondary | ICD-10-CM | POA: Diagnosis not present

## 2019-07-13 DIAGNOSIS — Z79899 Other long term (current) drug therapy: Secondary | ICD-10-CM | POA: Diagnosis not present

## 2019-07-16 ENCOUNTER — Other Ambulatory Visit: Payer: Self-pay | Admitting: Cardiology

## 2019-07-16 DIAGNOSIS — I480 Paroxysmal atrial fibrillation: Secondary | ICD-10-CM

## 2019-07-16 DIAGNOSIS — I5042 Chronic combined systolic (congestive) and diastolic (congestive) heart failure: Secondary | ICD-10-CM

## 2019-07-19 ENCOUNTER — Telehealth: Payer: Self-pay

## 2019-07-19 NOTE — Telephone Encounter (Signed)
[name] called due to pt having LE Edema. She was last seen by AK on 10/13. She had a f/u with [place] right after and he stopped the spironolactone due to elevated kidney functions and said to take Lasix 20 which she has been doing but is still having edema. We originally had pt on 40 of Lasix. Please advise.//ah

## 2019-07-19 NOTE — Telephone Encounter (Signed)
Madison Marquez aware of instructions.//ah

## 2019-07-19 NOTE — Telephone Encounter (Signed)
Have her increase to 40 mg daily for the next few days to see if this will improve. If swelling improves, try to reduce dose down again to 20 mg.

## 2019-07-21 ENCOUNTER — Other Ambulatory Visit: Payer: Self-pay | Admitting: Cardiology

## 2019-07-26 ENCOUNTER — Telehealth: Payer: Self-pay

## 2019-07-26 NOTE — Progress Notes (Signed)
Primary Physician:  Lajean Manes, MD   Patient ID: Madison Marquez, female    DOB: Aug 17, 1924, 85 y.o.   MRN: [id number]  Subjective:    Chief Complaint  Patient presents with   Shortness of Breath   Follow-up    HPI: Madison Marquez  is a 85 y.o. female  with chronic systolic and diastolic heart failure, CKD stage 3, hypertension, hypothyroidism, and paroxysmal atrial flutter and paroxysmal atrial fibrillation.    Her last hospital admission was in January 2019 with acute decompensated heart failure and severe LV systolic dysfunction for which he underwent coronary angiography which revealed normal coronary arteries and low normal LVEF.    Patient has also had paroxysmal atrial fibrillation, has underlying marked sinus bradycardia and low blood pressure hence not been able to titrate her beta-blockers up. She now presents for follow-up of PAF, dyspnea, leg edema and fatigue.  States that she has had black tarry stools that lasted for a few days about 2-3 weeks ago.  She states that Lasix is not working and she is developing worsening leg edema and also shortness of breath and fatigue.  Past Medical History:  Diagnosis Date   Acute CHF (congestive heart failure) ([place]) 08/25/2017   Arthritis    "probably in my right knee" (08/25/2017)   [facility] infection 1950s   Hypercholesterolemia    Hypothyroid    Migraine    "I've had 1 in my lifetime" (08/25/2017)    Past Surgical History:  Procedure Laterality Date   ABDOMINAL HYSTERECTOMY     ANKLE FRACTURE SURGERY Right    BLADDER SUSPENSION     BREAST LUMPECTOMY Left    BREAST SURGERY Left    "leaky nipple"   CARDIOVERSION N/A 04/14/2018   Procedure: CARDIOVERSION;  Surgeon: Adrian Prows, MD;  Location: [facility];  Service: Cardiovascular;  Laterality: N/A;   CARDIOVERSION N/A 05/05/2018   Procedure: CARDIOVERSION;  Surgeon: Nigel Mormon, MD;  Location: [facility];  Service: Cardiovascular;   Laterality: N/A;   CARPAL TUNNEL RELEASE Left 10/15/2016   Procedure: LEFT CARPAL TUNNEL RELEASE;  Surgeon: Daryll Brod, MD;  Location: [facility];  Service: Orthopedics;  Laterality: Left;   CATARACT EXTRACTION W/ INTRAOCULAR LENS  IMPLANT, BILATERAL Bilateral 2017   FRACTURE SURGERY     HERNIA REPAIR     LEFT HEART CATH AND CORONARY ANGIOGRAPHY N/A 08/29/2017   Procedure: LEFT HEART CATH AND CORONARY ANGIOGRAPHY;  Surgeon: Adrian Prows, MD;  Location: [facility];  Service: Cardiovascular;  Laterality: N/A;   UMBILICAL HERNIA REPAIR      Social History   Socioeconomic History   Marital status: Widowed    Spouse name: Not on file   Number of children: 2   Years of education: Not on file   Highest education level: Not on file  Occupational History   Not on file  Social Needs   Financial resource strain: Not on file   Food insecurity    Worry: Not on file    Inability: Not on file   Transportation needs    Medical: Not on file    Non-medical: Not on file  Tobacco Use   Smoking status: Never Smoker   Smokeless tobacco: Never Used  Substance and Sexual Activity   Alcohol use: No   Drug use: No   Sexual activity: Not on file  Lifestyle   Physical activity    Days per week: Not on file    Minutes per  session: Not on file   Stress: Not on file  Relationships   Social connections    Talks on phone: Not on file    Gets together: Not on file    Attends religious service: Not on file    Active member of club or organization: Not on file    Attends meetings of clubs or organizations: Not on file    Relationship status: Not on file   Intimate partner violence    Fear of current or ex partner: Not on file    Emotionally abused: Not on file    Physically abused: Not on file    Forced sexual activity: Not on file  Other Topics Concern   Not on file  Social History Narrative   Not on file    Review of Systems  Constitution:  Positive for malaise/fatigue. Negative for decreased appetite, weight gain and weight loss.  Eyes: Negative for visual disturbance.  Cardiovascular: Positive for dyspnea on exertion and leg swelling. Negative for chest pain, claudication, orthopnea, palpitations and syncope.  Respiratory: Negative for hemoptysis and wheezing.   Endocrine: Negative for cold intolerance and heat intolerance.  Hematologic/Lymphatic: Does not bruise/bleed easily.  Skin: Negative for nail changes.  Musculoskeletal: Positive for joint pain (left hip). Negative for muscle weakness and myalgias.  Gastrointestinal: Negative for abdominal pain, change in bowel habit, nausea and vomiting.  Neurological: Negative for difficulty with concentration, dizziness, focal weakness and headaches.  Psychiatric/Behavioral: Negative for altered mental status and suicidal ideas.  All other systems reviewed and are negative.     Objective:  Blood pressure 122/65, pulse 67, temperature 98.8 F (37.1 C), height 5' 4"  (1.626 m), weight 160 lb (72.6 kg), SpO2 98 %. Body mass index is 27.46 kg/m.    Physical Exam  Constitutional: She is oriented to person, place, and time. Vital signs are normal. She appears well-developed and well-nourished.  HENT:  Head: Normocephalic and atraumatic.  Neck: Normal range of motion.  Cardiovascular: Normal rate, regular rhythm and intact distal pulses. Exam reveals no gallop.  Murmur heard.  Midsystolic murmur is present with a grade of 2/6 at the apex. Pulses:      Carotid pulses are 2+ on the right side and 2+ on the left side. 2-3+ bilateral below-knee repeating leg edema. JVD present  Pulmonary/Chest: Effort normal and breath sounds normal. No accessory muscle usage. No respiratory distress.  Abdominal: Soft. Bowel sounds are normal.  Musculoskeletal: Normal range of motion.  Neurological: She is alert and oriented to person, place, and time.  Skin: Skin is warm and dry.  Vitals  reviewed.  Radiology: No results found.  Laboratory examination:   07/13/2019: Creatinine 1.39, EGFR 35, potassium 4.1, BMP otherwise normal.  BNP 394.  RBC 3.3, hemoglobin 10.4, hematocrit 31.9, RDW 16.4%, CBC otherwise normal.  06/08/2018: Creatinine 1.02, EGFR 48/55, potassium 4.6, BMP otherwise normal.   05/01/2018: Creatinine 1.32, eGFR 35/41, Potassium 5.1, BMP otherwise normal.   CMP Latest Ref Rng & Units 03/25/2019 08/30/2017 08/29/2017  Glucose 65 - 99 mg/dL 54(L) 85 87  BUN 10 - 36 mg/dL 18 21(H) 21(H)  Creatinine 0.57 - 1.00 mg/dL 1.14(H) 0.80 0.92  Sodium 134 - 144 mmol/L 144 137 137  Potassium 3.5 - 5.2 mmol/L 4.7 3.9 4.2  Chloride 96 - 106 mmol/L 106 104 105  CO2 20 - 29 mmol/L 24 25 26   Calcium 8.7 - 10.3 mg/dL 9.4 8.7(L) 9.1  Total Protein 6.5 - 8.1 g/dL - - -  Total  Bilirubin 0.3 - 1.2 mg/dL - - -  Alkaline Phos 38 - 126 U/L - - -  AST 15 - 41 U/L - - -  ALT 14 - 54 U/L - - -   CBC Latest Ref Rng & Units 08/25/2017 04/07/2017 07/31/2014  WBC 4.0 - 10.5 K/uL 6.0 6.5 4.7  Hemoglobin 12.0 - 15.0 g/dL 13.5 13.0 12.3  Hematocrit 36.0 - 46.0 % 41.2 40.3 38.3  Platelets 150 - 400 K/uL 237 213 193   Lipid Panel     Component Value Date/Time   CHOL 169 08/25/2017 1303   TRIG 51 08/25/2017 1303   HDL 61 08/25/2017 1303   CHOLHDL 2.8 08/25/2017 1303   VLDL 10 08/25/2017 1303   LDLCALC 98 08/25/2017 1303   HEMOGLOBIN A1C No results found for: HGBA1C, MPG TSH No results for input(s): TSH in the last 8760 hours.  PRN Meds:. Medications Discontinued During This Encounter  Medication Reason   furosemide (LASIX) 40 MG tablet Change in therapy   Current Meds  Medication Sig   amiodarone (PACERONE) 200 MG tablet Take 1 tablet (200 mg total) by mouth daily.   carvedilol (COREG) 6.25 MG tablet TAKE 1 TABLET BY MOUTH TWICE DAILY (Patient taking differently: Take 6.25 mg by mouth daily. )   Cholecalciferol (VITAMIN D) 2000 units tablet Take 2,000 Units by mouth  daily.    Cyanocobalamin (VITAMIN B-12) 5000 MCG TBDP Take 5,000 mcg by mouth daily.    levothyroxine (SYNTHROID, LEVOTHROID) 75 MCG tablet Take 75 mcg by mouth daily before breakfast.    oxyCODONE-acetaminophen (PERCOCET/ROXICET) 5-325 MG tablet Take 1 tablet by mouth every 6 (six) hours as needed for severe pain.   XARELTO 15 MG TABS tablet TAKE 1 TABLET BY MOUTH EVERY EVENING AFTER DINNER   [DISCONTINUED] furosemide (LASIX) 40 MG tablet Take 1 tablet (40 mg total) by mouth daily for 15 days.    Cardiac Studies:   Echocardiogram 03/05/2019: Left ventricle cavity is normal in size. Moderate concentric hypertrophy of the left ventricle. Moderate global hypokinesis.  Moderately depressed LV systolic function with visual EF 35-40%. Diastolic function not assessed due to possible atrial fibrillation. Calculated EF 35%. Left atrial cavity is mildly dilated. Trileaflet aortic valve. Mild aortic valve leaflet calcification. Moderate (Grade II) aortic regurgitation. Mild to moderate mitral regurgitation. Mild tricuspid regurgitation. Estimated pulmonary artery systolic pressure is 29 mmHg. Mild pulmonic regurgitation. No significant change compared to previous study on 08/26/2017.  Coronary angiogram 08/29/2017: Normal coronary arteries. Normal LV systolic function.  Direct current cardioversion 04/14/2018: A. Fibrillation: 120x1, 150x2, 150x3 to NSR.  Assessment:     ICD-10-CM   1. Acute combined systolic and diastolic heart failure (HCC)  I50.41 B Nat Peptide    Pulse oximetry, overnight    torsemide (DEMADEX) 20 MG tablet  2. Paroxysmal atrial fibrillation (HCC)  I48.0 TSH  3. Stage 3a chronic kidney disease  N18.31 CBC    Basic Metabolic Panel (BMET)  4. Melena  K92.1   5. Anemia, unspecified type  D64.9     CHA2DS2-VASc Score is 5 with yearly risk of stroke of 6.7 %. (CHF; HTN; vasc disease DM, Female = 1; Age <65 =1; 65-74 = 2, >75 =3; stroke = 3).  EKG 03/11/2019: Atrial  flutter with RVR at 138 bpm, left axis deviation, cannot exclude inferior infarct old. Poor R wave progression cannot exclude anterolateral infarct old. Compared to EKG 05/14/2018, A flutter is new.   Recommendations:   Madison Marquez  is a  85 y.o.  female  with chronic systolic and diastolic heart failure, CKD stage 3, hypertension, hypothyroidism, and paroxysmal atrial flutter and paroxysmal atrial fibrillation. Has symptomatic A.F and has had cardioversion in past and is on chronic Amiodarone, has underlying marked sinus bradycardia and low blood pressure hence not been able to titrate her beta-blockers up. She now presents for follow-up of PAF, dyspnea, leg edema and fatigue. This is her 2 month OV.   Over the past one month she is now again been in acute decompensated heart failure, however recently performed labs were reviewed. Stable BMP, however CBC reveals no anemia and needs repeat CBC along with possibly iron studies. BNP was also elevated. She does report melena that occurred about 2-3 weeks ago which has resolved. I would have a low threshold to discontinue anticoagulation if she has recurrence of melena. Patient has been advised to hold Xarelto she notices dark stools.  She is presently maintaining sinus rhythm on amiodarone 200 mg daily, blood pressure is well controlled.  I will discontinue furosemide and sutured to torsemide 20 mg p.o. b.i.d.  I'll also set her up for nocturnal oximetry, she may benefit from oxygen supplementation if necessary.  I'd like to see her back here in the office in 2 weeks for follow-up. "Total time spent with patient was  40 minutes and greater than 50% of that time was spent in face to face discussion, counseling and coordination care"   Adrian Prows, MD, [facility] 07/27/2019, 3:52 PM [facility] [place] Pager: [phone] Office: [phone] If no answer Cell [phone]

## 2019-07-26 NOTE — Progress Notes (Signed)
07/13/2019: Creatinine 1.39, EGFR 35, potassium 4.1, BMP otherwise normal.  BNP 394.  RBC 3.3, hemoglobin 10.4, hematocrit 31.9, RDW 16.4%, CBC otherwise normal.

## 2019-07-26 NOTE — Telephone Encounter (Signed)
I called Madison Marquez office and requesting labs, pt is coming in tomorrow to see Lavone Nian

## 2019-07-26 NOTE — Telephone Encounter (Signed)
Patient is still having swelling and now SOB at night, she is also not using the bathroom as much Please advise

## 2019-07-26 NOTE — Telephone Encounter (Signed)
She will need to be seen by [place] or her PCP. If seen by [place], please try to get recent labs from PCP office

## 2019-07-27 ENCOUNTER — Encounter: Payer: Self-pay | Admitting: Cardiology

## 2019-07-27 ENCOUNTER — Ambulatory Visit (INDEPENDENT_AMBULATORY_CARE_PROVIDER_SITE_OTHER): Payer: Medicare Other | Admitting: Cardiology

## 2019-07-27 ENCOUNTER — Other Ambulatory Visit: Payer: Self-pay

## 2019-07-27 VITALS — BP 122/65 | HR 67 | Temp 98.8°F | Ht 64.0 in | Wt 160.0 lb

## 2019-07-27 DIAGNOSIS — N1831 Chronic kidney disease, stage 3a: Secondary | ICD-10-CM | POA: Diagnosis not present

## 2019-07-27 DIAGNOSIS — D649 Anemia, unspecified: Secondary | ICD-10-CM | POA: Diagnosis not present

## 2019-07-27 DIAGNOSIS — I480 Paroxysmal atrial fibrillation: Secondary | ICD-10-CM | POA: Diagnosis not present

## 2019-07-27 DIAGNOSIS — K921 Melena: Secondary | ICD-10-CM

## 2019-07-27 DIAGNOSIS — I5041 Acute combined systolic (congestive) and diastolic (congestive) heart failure: Secondary | ICD-10-CM

## 2019-07-27 MED ORDER — TORSEMIDE 20 MG PO TABS: 20.0000 mg | ORAL_TABLET | Freq: Two times a day (BID) | ORAL | 2 refills | Status: DC

## 2019-07-27 NOTE — Patient Instructions (Signed)
Please have your lab work done at least 2-3 days before your visit with [place].  Discontinue and stopped taking furosemide, your prescribed a new medication called torsemide which will take in the morning and late afternoon.  If leg swelling and shortness of breath does improved, he can start taking it once a day.  Hope  You feel better.  We will also be evaluating you for oxygen need.

## 2019-08-05 DIAGNOSIS — I480 Paroxysmal atrial fibrillation: Secondary | ICD-10-CM | POA: Diagnosis not present

## 2019-08-05 DIAGNOSIS — N1831 Chronic kidney disease, stage 3a: Secondary | ICD-10-CM | POA: Diagnosis not present

## 2019-08-05 DIAGNOSIS — I5041 Acute combined systolic (congestive) and diastolic (congestive) heart failure: Secondary | ICD-10-CM | POA: Diagnosis not present

## 2019-08-06 LAB — BASIC METABOLIC PANEL
BUN/Creatinine Ratio: 19 (ref 12–28)
BUN: 29 mg/dL (ref 10–36)
CO2: 30 mmol/L — ABNORMAL HIGH (ref 20–29)
Calcium: 9.4 mg/dL (ref 8.7–10.3)
Chloride: 97 mmol/L (ref 96–106)
Creatinine, Ser: 1.56 mg/dL — ABNORMAL HIGH (ref 0.57–1.00)
GFR calc Af Amer: 33 mL/min/{1.73_m2} — ABNORMAL LOW (ref >59)
GFR calc non Af Amer: 28 mL/min/{1.73_m2} — ABNORMAL LOW (ref >59)
Glucose: 83 mg/dL (ref 65–99)
Potassium: 3.9 mmol/L (ref 3.5–5.2)
Sodium: 141 mmol/L (ref 134–144)

## 2019-08-06 LAB — CBC
Hematocrit: 32 % — ABNORMAL LOW (ref 34.0–46.6)
Hemoglobin: 10.6 g/dL — ABNORMAL LOW (ref 11.1–15.9)
MCH: 30.5 pg (ref 26.6–33.0)
MCHC: 33.1 g/dL (ref 31.5–35.7)
MCV: 92 fL (ref 79–97)
Platelets: 250 10*3/uL (ref 150–450)
RBC: 3.47 x10E6/uL — ABNORMAL LOW (ref 3.77–5.28)
RDW: 12.5 % (ref 11.7–15.4)
WBC: 6.2 10*3/uL (ref 3.4–10.8)

## 2019-08-06 LAB — BRAIN NATRIURETIC PEPTIDE: BNP: 397.6 pg/mL — ABNORMAL HIGH (ref 0.0–100.0)

## 2019-08-06 LAB — TSH: TSH: 6.96 u[IU]/mL — ABNORMAL HIGH (ref 0.450–4.500)

## 2019-08-09 NOTE — Progress Notes (Signed)
Please order home O2

## 2019-08-11 ENCOUNTER — Encounter: Payer: Self-pay | Admitting: Cardiology

## 2019-08-11 ENCOUNTER — Other Ambulatory Visit: Payer: Self-pay

## 2019-08-11 ENCOUNTER — Ambulatory Visit (INDEPENDENT_AMBULATORY_CARE_PROVIDER_SITE_OTHER): Payer: Medicare Other | Admitting: Cardiology

## 2019-08-11 VITALS — BP 131/56 | HR 63 | Temp 95.4°F | Ht 64.0 in | Wt 159.8 lb

## 2019-08-11 DIAGNOSIS — N1831 Chronic kidney disease, stage 3a: Secondary | ICD-10-CM

## 2019-08-11 DIAGNOSIS — I480 Paroxysmal atrial fibrillation: Secondary | ICD-10-CM | POA: Diagnosis not present

## 2019-08-11 DIAGNOSIS — I447 Left bundle-branch block, unspecified: Secondary | ICD-10-CM

## 2019-08-11 DIAGNOSIS — I5041 Acute combined systolic (congestive) and diastolic (congestive) heart failure: Secondary | ICD-10-CM

## 2019-08-11 DIAGNOSIS — D649 Anemia, unspecified: Secondary | ICD-10-CM

## 2019-08-11 DIAGNOSIS — I129 Hypertensive chronic kidney disease with stage 1 through stage 4 chronic kidney disease, or unspecified chronic kidney disease: Secondary | ICD-10-CM

## 2019-08-11 DIAGNOSIS — G4734 Idiopathic sleep related nonobstructive alveolar hypoventilation: Secondary | ICD-10-CM

## 2019-08-11 MED ORDER — METOLAZONE 5 MG PO TABS: 5.0000 mg | ORAL_TABLET | Freq: Every day | ORAL | 0 refills | Status: DC | PRN

## 2019-08-11 NOTE — Progress Notes (Signed)
Primary Physician:  Lajean Manes, MD   Patient ID: Madison Marquez, female    DOB: 08-04-1924, 85 y.o.   MRN: [id number]  Subjective:    Chief Complaint  Patient presents with  . Congestive Heart Failure  . Atrial Fibrillation  . Follow-up    labs    HPI: Madison Marquez  is a 85 y.o. female  with chronic systolic and diastolic heart failure, CKD stage 3, hypertension, hypothyroidism, and paroxysmal atrial flutter and atrial fibrillation. Has symptomatic A.F and has had cardioversion in past and is on chronic Amiodarone, has underlying marked sinus bradycardia and low blood pressure hence not been able to titrate her beta-blockers up.   She now presents for follow-up of PAF, dyspnea, leg edema and fatigue. This is her Two-week office visit.    Since adding torsemide, leg edema is improved.  She still continues to have orthopnea and also marked fatigue and dyspnea with even minimal activities.  She has not had any further tarry stools.  Past Medical History:  Diagnosis Date  . Acute CHF (congestive heart failure) ([place]) 08/25/2017  . Arthritis    "probably in my right knee" (08/25/2017)  . [facility] Patient infection [phone]  . Hypercholesterolemia   . Hypothyroid   . Migraine    "I've had 1 in my lifetime" (08/25/2017)    Past Surgical History:  Procedure Laterality Date  . ABDOMINAL HYSTERECTOMY    . ANKLE FRACTURE SURGERY Right   . BLADDER SUSPENSION    . BREAST LUMPECTOMY Left   . BREAST SURGERY Left    "leaky nipple"  . CARDIOVERSION N/A 04/14/2018   Procedure: CARDIOVERSION;  Surgeon: Adrian Prows, MD;  Location: [facility] ENDOSCOPY;  Service: Cardiovascular;  Laterality: N/A;  . CARDIOVERSION N/A 05/05/2018   Procedure: CARDIOVERSION;  Surgeon: Nigel Mormon, MD;  Location: [facility] ENDOSCOPY;  Service: Cardiovascular;  Laterality: N/A;  . CARPAL TUNNEL RELEASE Left 10/15/2016   Procedure: LEFT CARPAL TUNNEL RELEASE;  Surgeon: Daryll Brod, MD;  Location: [place];  Service: Orthopedics;  Laterality: Left;  . CATARACT EXTRACTION W/ INTRAOCULAR LENS  IMPLANT, BILATERAL Bilateral 2017  . FRACTURE SURGERY    . HERNIA REPAIR    . LEFT HEART CATH AND CORONARY ANGIOGRAPHY N/A 08/29/2017   Procedure: LEFT HEART CATH AND CORONARY ANGIOGRAPHY;  Surgeon: Adrian Prows, MD;  Location: [place] [facility];  Service: Cardiovascular;  Laterality: N/A;  . UMBILICAL HERNIA REPAIR      Social History   Socioeconomic History  . Marital status: Widowed    Spouse name: Not on file  . Number of children: 2  . Years of education: Not on file  . Highest education level: Not on file  Occupational History  . Not on file  Tobacco Use  . Smoking status: Never Smoker  . Smokeless tobacco: Never Used  Substance and Sexual Activity  . Alcohol use: No  . Drug use: No  . Sexual activity: Not on file  Other Topics Concern  . Not on file  Social History Narrative  . Not on file   Social Determinants of Health   Financial Resource Strain:   . Difficulty of Paying Living Expenses: Not on file  Food Insecurity:   . Worried About Charity fundraiser in the Last Year: Not on file  . Ran Out of Food in the Last Year: Not on file  Transportation Needs:   . Lack of Transportation (Medical): Not on file  . Lack of  Transportation (Non-Medical): Not on file  Physical Activity:   . Days of Exercise per Week: Not on file  . Minutes of Exercise per Session: Not on file  Stress:   . Feeling of Stress : Not on file  Social Connections:   . Frequency of Communication with Friends and Family: Not on file  . Frequency of Social Gatherings with Friends and Family: Not on file  . Attends Religious Services: Not on file  . Active Member of Clubs or Organizations: Not on file  . Attends Archivist Meetings: Not on file  . Marital Status: Not on file  Intimate Partner Violence:   . Fear of Current or Ex-Partner: Not on file  . Emotionally Abused: Not on file  .  Physically Abused: Not on file  . Sexually Abused: Not on file    Review of Systems  Constitution: Positive for malaise/fatigue. Negative for decreased appetite, weight gain and weight loss.  Eyes: Negative for visual disturbance.  Cardiovascular: Positive for dyspnea on exertion and leg swelling. Negative for chest pain, claudication, orthopnea, palpitations and syncope.  Respiratory: Negative for hemoptysis and wheezing.   Endocrine: Negative for cold intolerance and heat intolerance.  Hematologic/Lymphatic: Does not bruise/bleed easily.  Skin: Negative for nail changes.  Musculoskeletal: Positive for joint pain (left hip). Negative for muscle weakness and myalgias.  Gastrointestinal: Negative for abdominal pain, change in bowel habit, nausea and vomiting.  Neurological: Negative for difficulty with concentration, dizziness, focal weakness and headaches.  Psychiatric/Behavioral: Negative for altered mental status and suicidal ideas.  All other systems reviewed and are negative.     Objective:  Blood pressure (!) 131/56, pulse 63, temperature (!) 95.4 F (35.2 C), height 5' 4"  (1.626 m), weight 159 lb 12.8 oz (72.5 kg), SpO2 98 %. Body mass index is 27.43 kg/m.    Vitals with BMI 08/11/2019 07/27/2019 06/08/2019  Height 5' 4"  5' 4"  -  Weight 159 lbs 13 oz 160 lbs -  BMI 27.07 86.75 -  Systolic 449 201 007  Diastolic 56 65 59  Pulse 63 67 67    Physical Exam  Constitutional: She is oriented to person, place, and time. Vital signs are normal. She appears well-developed and well-nourished.  HENT:  Head: Normocephalic and atraumatic.  Cardiovascular: Normal rate, regular rhythm and intact distal pulses. Exam reveals no gallop.  Murmur heard.  Midsystolic murmur is present with a grade of 2/6 at the apex. Pulses:      Carotid pulses are 2+ on the right side and 2+ on the left side. 2-3+ bilateral below-knee repeating leg edema. JVD present  Pulmonary/Chest: Effort normal and  breath sounds normal. No accessory muscle usage. No respiratory distress.  Abdominal: Soft. Bowel sounds are normal.  Musculoskeletal:        General: Normal range of motion.     Cervical back: Normal range of motion.  Neurological: She is alert and oriented to person, place, and time.  Skin: Skin is warm and dry.  Vitals reviewed.  Radiology: No results found.  Laboratory examination:   07/13/2019: Creatinine 1.39, EGFR 35, potassium 4.1, BMP otherwise normal.  BNP 394.  RBC 3.3, hemoglobin 10.4, hematocrit 31.9, RDW 16.4%, CBC otherwise normal.  06/08/2018: Creatinine 1.02, EGFR 48/55, potassium 4.6, BMP otherwise normal.   05/01/2018: Creatinine 1.32, eGFR 35/41, Potassium 5.1, BMP otherwise normal.   CMP Latest Ref Rng & Units 08/05/2019 03/25/2019 08/30/2017  Glucose 65 - 99 mg/dL 83 54(L) 85  BUN 10 - 36 mg/dL  29 18 21(H)  Creatinine 0.57 - 1.00 mg/dL 1.56(H) 1.14(H) 0.80  Sodium 134 - 144 mmol/L 141 144 137  Potassium 3.5 - 5.2 mmol/L 3.9 4.7 3.9  Chloride 96 - 106 mmol/L 97 106 104  CO2 20 - 29 mmol/L 30(H) 24 25  Calcium 8.7 - 10.3 mg/dL 9.4 9.4 8.7(L)  Total Protein 6.5 - 8.1 g/dL - - -  Total Bilirubin 0.3 - 1.2 mg/dL - - -  Alkaline Phos 38 - 126 U/L - - -  AST 15 - 41 U/L - - -  ALT 14 - 54 U/L - - -   CBC Latest Ref Rng & Units 08/05/2019 08/25/2017 04/07/2017  WBC 3.4 - 10.8 x10E3/uL 6.2 6.0 6.5  Hemoglobin 11.1 - 15.9 g/dL 10.6(L) 13.5 13.0  Hematocrit 34.0 - 46.6 % 32.0(L) 41.2 40.3  Platelets 150 - 450 x10E3/uL 250 237 213   Lipid Panel     Component Value Date/Time   CHOL 169 08/25/2017 1303   TRIG 51 08/25/2017 1303   HDL 61 08/25/2017 1303   CHOLHDL 2.8 08/25/2017 1303   VLDL 10 08/25/2017 1303   LDLCALC 98 08/25/2017 1303   HEMOGLOBIN A1C No results found for: HGBA1C, MPG TSH Recent Labs    08/05/19 1124  TSH 6.960*    PRN Meds:. Medications Discontinued During This Encounter  Medication Reason  . carvedilol (COREG) 6.25 MG tablet Error    Current Meds  Medication Sig  . amiodarone (PACERONE) 200 MG tablet Take 1 tablet (200 mg total) by mouth daily.  . carvedilol (COREG) 6.25 MG tablet Take 6.25 mg by mouth 2 (two) times daily with a meal. Taking one half tab bid  . Cholecalciferol (VITAMIN D) 2000 units tablet Take 2,000 Units by mouth daily.   . Cyanocobalamin (VITAMIN B-12) 5000 MCG TBDP Take 5,000 mcg by mouth daily.   . ferrous sulfate 325 (65 FE) MG tablet Take 325 mg by mouth 3 (three) times a week. M,W,F  . levothyroxine (SYNTHROID, LEVOTHROID) 75 MCG tablet Take 75 mcg by mouth daily before breakfast.   . oxyCODONE-acetaminophen (PERCOCET/ROXICET) 5-325 MG tablet Take 1 tablet by mouth every 6 (six) hours as needed for severe pain.  Marland Kitchen torsemide (DEMADEX) 20 MG tablet Take 1 tablet (20 mg total) by mouth 2 (two) times daily at 10 am and 4 pm.  . XARELTO 15 MG TABS tablet TAKE 1 TABLET BY MOUTH EVERY EVENING AFTER DINNER    Cardiac Studies:   Echocardiogram 03/05/2019: Left ventricle cavity is normal in size. Moderate concentric hypertrophy of the left ventricle. Moderate global hypokinesis.  Moderately depressed LV systolic function with visual EF 35-40%. Diastolic function not assessed due to possible atrial fibrillation. Calculated EF 35%. Left atrial cavity is mildly dilated. Trileaflet aortic valve. Mild aortic valve leaflet calcification. Moderate (Grade II) aortic regurgitation. Mild to moderate mitral regurgitation. Mild tricuspid regurgitation. Estimated pulmonary artery systolic pressure is 29 mmHg. Mild pulmonic regurgitation. No significant change compared to previous study on 08/26/2017.  Coronary angiogram 08/29/2017: Normal coronary arteries. Normal LV systolic function.  Direct current cardioversion 04/14/2018: A. Fibrillation: 120x1, 150x2, 150x3 to NSR.  Nocturnal oximetry 08/05/2019: SpO2 less than 88% 21 minutes, less than 89% 37 minutes, oxygen desaturation events 457, index 44.  Patient  qualifies for oxygen supplementation by Medicare guidelines for group 1.   Assessment:     ICD-10-CM   1. Acute combined systolic and diastolic heart failure (HCC)  I50.41 EKG 12-Lead    For home use only  DME oxygen    PCV ECHOCARDIOGRAM COMPLETE    B Nat Peptide    metolazone (ZAROXOLYN) 5 MG tablet  2. Paroxysmal atrial fibrillation (HCC)  I48.0   3. Stage 3a chronic kidney disease  N18.31   4. Anemia, unspecified type  D64.9 Iron and TIBC    Ferritin    Hemoglobin and hematocrit, blood  5. Nocturnal hypoxemia  G47.34 For home use only DME oxygen  6. LBBB (left bundle branch block)  I44.7 PCV ECHOCARDIOGRAM COMPLETE   CHA2DS2-VASc Score is 5 with yearly risk of stroke of 6.7 %. (CHF; HTN; vasc disease DM, Female = 1; Age <65 =1; 65-74 = 2, >75 =3; stroke = 3).  EKG 08/11/2019: Sinus rhythm with first-degree AV block at the rate of 65 bpm, left bundle branch block.  No further analysis. Compared to 03/11/2019, left bundle branch block new. A. Fl not present.  EKG 03/11/2019: Atrial flutter with RVR at 138 bpm, left axis deviation, cannot exclude inferior infarct old. Poor R wave progression cannot exclude anterolateral infarct old. Compared to EKG 05/14/2018, A flutter is new.   Recommendations:   Madison Marquez  is a 85 y.o.  female  with chronic systolic and diastolic heart failure, CKD stage 3, hypertension, hypothyroidism, and paroxysmal atrial flutter and atrial fibrillation. Has symptomatic A.F and has had cardioversion in past and is on chronic Amiodarone, has underlying marked sinus bradycardia and low blood pressure hence not been able to titrate her beta-blockers up.   She now presents for follow-up of PAF, dyspnea, leg edema and fatigue. This is her Two-week office visit.   Leg edema has improved significantly but still 2-3+ edema.  I reviewed her labs, BNP is moderately elevated, however she has developed worsening anemia. I will add Zaroxolyn 5 mg daily for 2-3 days on a  p.r.n. basis, clear cut instructions given.  She also has severe nocturnal hypoxemia, we'll set her up for oxygen supplementation.    She has had Dark stools on an off but not presently and was told to have guaiac positive stool by her PCP about a month ago.  I'll set her up for iron studies.  I may have to discontinue anticoagulation.  She is presently maintaining sinus rhythm on amiodarone 200 mg daily, blood pressure is well controlled. She has new onset left bundle branch block 08/11/19.  This may indicate progression of cardiomyopathy. Office visit in one month.  Her son is present at the bedside.  "Total time spent with patient was  40 minutes and greater than 50% of that time was spent in face to face discussion, counseling and coordination care"   Adrian Prows, MD, [facility] 08/11/2019, 12:48 PM [facility]. [place] Pager: [phone] Office: [phone] If no answer Cell [phone]

## 2019-08-11 NOTE — Patient Instructions (Signed)
You will only take Metolazone for 2-3 days for severe leg swelling only and stop when improved once a day. This is in addition to torsemide.   You have been set up for lab draw and also home O2 therapy.  You will need echocardiogram and see [place] in 1 month.

## 2019-08-12 LAB — FERRITIN: Ferritin: 162 ng/mL — ABNORMAL HIGH (ref 15–150)

## 2019-08-12 LAB — IRON AND TIBC
Iron Saturation: 9 % — CL (ref 15–55)
Iron: 25 ug/dL — ABNORMAL LOW (ref 27–139)
Total Iron Binding Capacity: 264 ug/dL (ref 250–450)
UIBC: 239 ug/dL (ref 118–369)

## 2019-08-12 LAB — HEMOGLOBIN AND HEMATOCRIT, BLOOD
Hematocrit: 32.8 % — ABNORMAL LOW (ref 34.0–46.6)
Hemoglobin: 10.7 g/dL — ABNORMAL LOW (ref 11.1–15.9)

## 2019-08-12 LAB — BRAIN NATRIURETIC PEPTIDE: BNP: 421.8 pg/mL — ABNORMAL HIGH (ref 0.0–100.0)

## 2019-08-16 DIAGNOSIS — Z1159 Encounter for screening for other viral diseases: Secondary | ICD-10-CM | POA: Diagnosis not present

## 2019-08-16 DIAGNOSIS — Z20828 Contact with and (suspected) exposure to other viral communicable diseases: Secondary | ICD-10-CM | POA: Diagnosis not present

## 2019-08-23 DIAGNOSIS — Z1159 Encounter for screening for other viral diseases: Secondary | ICD-10-CM | POA: Diagnosis not present

## 2019-08-23 DIAGNOSIS — Z20828 Contact with and (suspected) exposure to other viral communicable diseases: Secondary | ICD-10-CM | POA: Diagnosis not present

## 2019-08-26 ENCOUNTER — Other Ambulatory Visit: Payer: Self-pay

## 2019-08-26 ENCOUNTER — Ambulatory Visit (INDEPENDENT_AMBULATORY_CARE_PROVIDER_SITE_OTHER): Payer: Medicare Other

## 2019-08-26 DIAGNOSIS — I5041 Acute combined systolic (congestive) and diastolic (congestive) heart failure: Secondary | ICD-10-CM

## 2019-08-26 DIAGNOSIS — I447 Left bundle-branch block, unspecified: Secondary | ICD-10-CM | POA: Diagnosis not present

## 2019-08-30 DIAGNOSIS — Z20828 Contact with and (suspected) exposure to other viral communicable diseases: Secondary | ICD-10-CM | POA: Diagnosis not present

## 2019-08-30 DIAGNOSIS — Z1159 Encounter for screening for other viral diseases: Secondary | ICD-10-CM | POA: Diagnosis not present

## 2019-09-02 DIAGNOSIS — Z1159 Encounter for screening for other viral diseases: Secondary | ICD-10-CM | POA: Diagnosis not present

## 2019-09-02 DIAGNOSIS — Z20828 Contact with and (suspected) exposure to other viral communicable diseases: Secondary | ICD-10-CM | POA: Diagnosis not present

## 2019-09-06 DIAGNOSIS — Z20828 Contact with and (suspected) exposure to other viral communicable diseases: Secondary | ICD-10-CM | POA: Diagnosis not present

## 2019-09-06 DIAGNOSIS — Z1159 Encounter for screening for other viral diseases: Secondary | ICD-10-CM | POA: Diagnosis not present

## 2019-09-07 ENCOUNTER — Telehealth: Payer: Self-pay | Admitting: Cardiology

## 2019-09-07 ENCOUNTER — Other Ambulatory Visit: Payer: Self-pay | Admitting: Cardiology

## 2019-09-07 DIAGNOSIS — I5041 Acute combined systolic (congestive) and diastolic (congestive) heart failure: Secondary | ICD-10-CM

## 2019-09-09 ENCOUNTER — Ambulatory Visit (INDEPENDENT_AMBULATORY_CARE_PROVIDER_SITE_OTHER): Payer: Medicare Other | Admitting: Cardiology

## 2019-09-09 ENCOUNTER — Encounter: Payer: Self-pay | Admitting: Cardiology

## 2019-09-09 ENCOUNTER — Telehealth: Payer: Self-pay | Admitting: Cardiology

## 2019-09-09 ENCOUNTER — Other Ambulatory Visit: Payer: Self-pay

## 2019-09-09 VITALS — BP 101/34 | HR 60 | Ht 63.0 in | Wt 147.0 lb

## 2019-09-09 DIAGNOSIS — G4734 Idiopathic sleep related nonobstructive alveolar hypoventilation: Secondary | ICD-10-CM

## 2019-09-09 DIAGNOSIS — D500 Iron deficiency anemia secondary to blood loss (chronic): Secondary | ICD-10-CM | POA: Diagnosis not present

## 2019-09-09 DIAGNOSIS — I5042 Chronic combined systolic (congestive) and diastolic (congestive) heart failure: Secondary | ICD-10-CM

## 2019-09-09 DIAGNOSIS — Z66 Do not resuscitate: Secondary | ICD-10-CM

## 2019-09-09 DIAGNOSIS — I480 Paroxysmal atrial fibrillation: Secondary | ICD-10-CM | POA: Diagnosis not present

## 2019-09-09 DIAGNOSIS — Z20828 Contact with and (suspected) exposure to other viral communicable diseases: Secondary | ICD-10-CM | POA: Diagnosis not present

## 2019-09-09 DIAGNOSIS — Z1159 Encounter for screening for other viral diseases: Secondary | ICD-10-CM | POA: Diagnosis not present

## 2019-09-09 DIAGNOSIS — N1831 Chronic kidney disease, stage 3a: Secondary | ICD-10-CM | POA: Diagnosis not present

## 2019-09-09 NOTE — Progress Notes (Signed)
Primary Physician:  Lajean Manes, MD   Patient ID: Madison Marquez, female    DOB: July 09, 1924, 86 y.o.   MRN: [id number]  Subjective:    Chief Complaint  Patient presents with  . Atrial Fibrillation  . Congestive Heart Failure    HPI: Madison Marquez  is a 86 y.o. female  with chronic systolic and diastolic heart failure, CKD stage 3, hypertension, hypothyroidism, and paroxysmal atrial flutter and atrial fibrillation. Has symptomatic A.F and has had cardioversion in past and is on chronic Amiodarone and maintains sinus rhythm, has underlying marked sinus bradycardia and low blood pressure hence not been able to titrate her beta-blockers up.   She now presents for follow-up of PAF, dyspnea, leg edema and fatigue. This is her three-week office visit. I also started on nocturnal oxygen supplementation.  Since then she has noticed improvement in fatigue, now also has noticed improvement leg edema and she has been using torsemide daily and metolazone on a p.r.n. basis.  Over the past one week she has not used any metolazone.  Her son is present.  Past Medical History:  Diagnosis Date  . Acute CHF (congestive heart failure) ([facility]) 08/25/2017  . Arthritis    "probably in my right knee" (08/25/2017)  . [facility] Patient infection [phone]  . Hypercholesterolemia   . Hypothyroid   . Migraine    "I've had 1 in my lifetime" (08/25/2017)    Past Surgical History:  Procedure Laterality Date  . ABDOMINAL HYSTERECTOMY    . ANKLE FRACTURE SURGERY Right   . BLADDER SUSPENSION    . BREAST LUMPECTOMY Left   . BREAST SURGERY Left    "leaky nipple"  . CARDIOVERSION N/A 04/14/2018   Procedure: CARDIOVERSION;  Surgeon: Adrian Prows, MD;  Location[facility] ENDOSCOPY;  Service: Cardiovascular;  Laterality: N/A;  . CARDIOVERSION N/A 05/05/2018   Procedure: CARDIOVERSION;  Surgeon: Nigel Mormon, MD;  Location: [facility] ENDOSCOPY;  Service: Cardiovascular;  Laterality: N/A;  . CARPAL TUNNEL RELEASE Left  10/15/2016   Procedure: LEFT CARPAL TUNNEL RELEASE;  Surgeon: Daryll Brod, MD;  Location: [facility];  Service: Orthopedics;  Laterality: Left;  . CATARACT EXTRACTION W/ INTRAOCULAR LENS  IMPLANT, BILATERAL Bilateral 2017  . FRACTURE SURGERY    . HERNIA REPAIR    . LEFT HEART CATH AND CORONARY ANGIOGRAPHY N/A 08/29/2017   Procedure: LEFT HEART CATH AND CORONARY ANGIOGRAPHY;  Surgeon: Adrian Prows, MD;  Location: [facility] [facility];  Service: Cardiovascular;  Laterality: N/A;  . UMBILICAL HERNIA REPAIR      Social History   Socioeconomic History  . Marital status: Widowed    Spouse name: Not on file  . Number of children: 2  . Years of education: Not on file  . Highest education level: Not on file  Occupational History  . Not on file  Tobacco Use  . Smoking status: Never Smoker  . Smokeless tobacco: Never Used  Substance and Sexual Activity  . Alcohol use: No  . Drug use: No  . Sexual activity: Not on file  Other Topics Concern  . Not on file  Social History Narrative  . Not on file   Social Determinants of Health   Financial Resource Strain:   . Difficulty of Paying Living Expenses: Not on file  Food Insecurity:   . Worried About Charity fundraiser in the Last Year: Not on file  . Ran Out of Food in the Last Year: Not on file  Transportation Needs:   .  Lack of Transportation (Medical): Not on file  . Lack of Transportation (Non-Medical): Not on file  Physical Activity:   . Days of Exercise per Week: Not on file  . Minutes of Exercise per Session: Not on file  Stress:   . Feeling of Stress : Not on file  Social Connections:   . Frequency of Communication with Friends and Family: Not on file  . Frequency of Social Gatherings with Friends and Family: Not on file  . Attends Religious Services: Not on file  . Active Member of Clubs or Organizations: Not on file  . Attends Archivist Meetings: Not on file  . Marital Status: Not on file  Intimate  Partner Violence:   . Fear of Current or Ex-Partner: Not on file  . Emotionally Abused: Not on file  . Physically Abused: Not on file  . Sexually Abused: Not on file    Review of Systems  Constitution: Positive for malaise/fatigue. Negative for decreased appetite, weight gain and weight loss.  Eyes: Negative for visual disturbance.  Cardiovascular: Positive for dyspnea on exertion and leg swelling (improved). Negative for chest pain, claudication, orthopnea, palpitations and syncope.  Respiratory: Negative for hemoptysis and wheezing.   Endocrine: Negative for cold intolerance and heat intolerance.  Hematologic/Lymphatic: Does not bruise/bleed easily.  Skin: Negative for nail changes.  Musculoskeletal: Positive for joint pain (left hip). Negative for muscle weakness and myalgias.  Gastrointestinal: Negative for abdominal pain, change in bowel habit, nausea and vomiting.  Neurological: Negative for difficulty with concentration, dizziness, focal weakness and headaches.  Psychiatric/Behavioral: Negative for altered mental status and suicidal ideas.  All other systems reviewed and are negative.     Objective:  Blood pressure (!) 101/34, pulse 60, height 5' 3"  (1.6 m), weight 147 lb (66.7 kg), SpO2 100 %. Body mass index is 26.04 kg/m.    Vitals with BMI 09/09/2019 08/11/2019 07/27/2019  Height 5' 3"  5' 4"  5' 4"   Weight 147 lbs 159 lbs 13 oz 160 lbs  BMI 26.05 30.05 11.02  Systolic 111 735 670  Diastolic 34 56 65  Pulse 60 63 67    Physical Exam  Constitutional: She is oriented to person, place, and time. Vital signs are normal.  petite  HENT:  Head: Normocephalic and atraumatic.  Cardiovascular: Normal rate, regular rhythm and intact distal pulses. Exam reveals no gallop.  Murmur heard.  Midsystolic murmur is present with a grade of 2/6 at the apex. Pulses:      Carotid pulses are 2+ on the right side and 2+ on the left side. 1-2+ bilateral below-knee repeating leg edema. JVD  absent  Pulmonary/Chest: Effort normal and breath sounds normal. No accessory muscle usage. No respiratory distress.  Abdominal: Soft. Bowel sounds are normal.  Musculoskeletal:     Cervical back: Normal range of motion.  Neurological: She is alert and oriented to person, place, and time.  Skin: Skin is warm and dry.   Radiology: No results found.  Laboratory examination:   CMP Latest Ref Rng & Units 08/05/2019 03/25/2019 08/30/2017  Glucose 65 - 99 mg/dL 83 54(L) 85  BUN 10 - 36 mg/dL 29 18 21(H)  Creatinine 0.57 - 1.00 mg/dL 1.56(H) 1.14(H) 0.80  Sodium 134 - 144 mmol/L 141 144 137  Potassium 3.5 - 5.2 mmol/L 3.9 4.7 3.9  Chloride 96 - 106 mmol/L 97 106 104  CO2 20 - 29 mmol/L 30(H) 24 25  Calcium 8.7 - 10.3 mg/dL 9.4 9.4 8.7(L)  Total Protein 6.5 -  8.1 g/dL - - -  Total Bilirubin 0.3 - 1.2 mg/dL - - -  Alkaline Phos 38 - 126 U/L - - -  AST 15 - 41 U/L - - -  ALT 14 - 54 U/L - - -   CBC Latest Ref Rng & Units 08/11/2019 08/05/2019 08/25/2017  WBC 3.4 - 10.8 x10E3/uL - 6.2 6.0  Hemoglobin 11.1 - 15.9 g/dL 10.7(L) 10.6(L) 13.5  Hematocrit 34.0 - 46.6 % 32.8(L) 32.0(L) 41.2  Platelets 150 - 450 x10E3/uL - 250 237   Lipid Panel     Component Value Date/Time   CHOL 169 08/25/2017 1303   TRIG 51 08/25/2017 1303   HDL 61 08/25/2017 1303   CHOLHDL 2.8 08/25/2017 1303   VLDL 10 08/25/2017 1303   LDLCALC 98 08/25/2017 1303   HEMOGLOBIN A1C No results found for: HGBA1C, MPG TSH Recent Labs    08/05/19 1124  TSH 6.960*   07/13/2019: Creatinine 1.39, EGFR 35, potassium 4.1, BMP otherwise normal.  BNP 394.  RBC 3.3, hemoglobin 10.4, hematocrit 31.9, RDW 16.4%, CBC otherwise normal.  06/08/2018: Creatinine 1.02, EGFR 48/55, potassium 4.6, BMP otherwise normal.   05/01/2018: Creatinine 1.32, eGFR 35/41, Potassium 5.1, BMP otherwise normal.   Labs 08/11/2019:   Ref Range & Units 4 wk ago  Total Iron Binding Capacity 250 - 450 ug/dL 264   UIBC 118 - 369 ug/dL 239   Iron 27  - 139 ug/dL 25Low    Iron Saturation 15 - 55 % 9Low Panic      Ref Range & Units 4 wk ago 1 mo ago  BNP 0.0 - 100.0 pg/mL 421.8High   397.6H    PRN Meds:. Medications Discontinued During This Encounter  Medication Reason  . XARELTO 15 MG TABS tablet Discontinued by provider   Current Meds  Medication Sig  . amiodarone (PACERONE) 200 MG tablet Take 1 tablet (200 mg total) by mouth daily.  . carvedilol (COREG) 6.25 MG tablet Take 6.25 mg by mouth 2 (two) times daily with a meal. Taking one half tab bid  . Cholecalciferol (VITAMIN D) 2000 units tablet Take 2,000 Units by mouth daily.   . Cyanocobalamin (VITAMIN B-12) 5000 MCG TBDP Take 5,000 mcg by mouth daily.   . ferrous sulfate 325 (65 FE) MG tablet Take 325 mg by mouth 3 (three) times a week. M,W,F  . levothyroxine (SYNTHROID, LEVOTHROID) 75 MCG tablet Take 75 mcg by mouth daily before breakfast.   . metolazone (ZAROXOLYN) 5 MG tablet TAKE 1 TABLET(5 MG) BY MOUTH DAILY AS NEEDED FOR LEG SWELLING ONLY  . oxyCODONE-acetaminophen (PERCOCET/ROXICET) 5-325 MG tablet Take 1 tablet by mouth every 6 (six) hours as needed for severe pain.  Marland Kitchen torsemide (DEMADEX) 20 MG tablet Take 1 tablet (20 mg total) by mouth 2 (two) times daily at 10 am and 4 pm. (Patient taking differently: Take 20 mg by mouth once. )  . [DISCONTINUED] XARELTO 15 MG TABS tablet TAKE 1 TABLET BY MOUTH EVERY EVENING AFTER DINNER   Cardiac Studies:   Coronary angiogram 08/29/2017: Normal coronary arteries. Normal LV systolic function.  Direct current cardioversion 04/14/2018: A. Fibrillation: 120x1, 150x2, 150x3 to NSR.  Echocardiogram 03/05/2019: Left ventricle cavity is normal in size. Moderate concentric hypertrophy of the left ventricle. Moderate global hypokinesis.  Moderately depressed LV systolic function with visual EF 35-40%. Diastolic function not assessed due to possible atrial fibrillation. Calculated EF 35%. Left atrial cavity is mildly dilated. Trileaflet  aortic valve. Mild aortic valve leaflet calcification. Moderate (  Grade II) aortic regurgitation. Mild to moderate mitral regurgitation. Mild tricuspid regurgitation. Estimated pulmonary artery systolic pressure is 29 mmHg. Mild pulmonic regurgitation. No significant change compared to previous study on 08/26/2017.  Nocturnal oximetry 08/05/2019: SpO2 less than 88% 21 minutes, less than 89% 37 minutes, oxygen desaturation events 457, index 44.  Patient qualifies for oxygen supplementation by Medicare guidelines for group 1.   Assessment:     ICD-10-CM   1. Chronic combined systolic and diastolic heart failure ([place])  I50.42 Ambulatory referral to Hospice  2. Paroxysmal atrial fibrillation (H[facility])  I48.0 Ambulatory referral to Hospice  3. Stage 3a chronic kidney disease  N18.31 Ambulatory referral to Hospice  4. Iron deficiency anemia due to chronic blood loss  D50.0 Ambulatory referral to Hospice  5. Nocturnal hypoxemia  G47.34   6. DNR (do not resuscitate)  Z66    CHA2DS2-VASc Score is 5 with yearly risk of stroke of 6.7 %. (CHF; HTN; vasc disease DM, Female = 1; Age <65 =1; 65-74 = 2, >75 =3; stroke = 3).  EKG 08/11/2019: Sinus rhythm with first-degree AV block at the rate of 65 bpm, left bundle branch block.  No further analysis. Compared to 03/11/2019, left bundle branch block new. A. Fl not present.  EKG 03/11/2019: Atrial flutter with RVR at 138 bpm, left axis deviation, cannot exclude inferior infarct old. Poor R wave progression cannot exclude anterolateral infarct old. Compared to EKG 05/14/2018, A flutter is new.   Recommendations:   Madison Marquez  is a 86 y.o.  female  with chronic systolic and diastolic heart failure, CKD stage 3, hypertension, hypothyroidism, and paroxysmal atrial flutter and atrial fibrillation. Has symptomatic A.F and has had cardioversion in past and is on chronic Amiodarone, and presently maintains sinus rhythm. Has underlying marked sinus bradycardia and low  blood pressure hence not been able to titrate her beta-blockers up.   She now presents for follow-up of PAF, dyspnea, leg edema and fatigue. This is her three-week office visit. Symptoms of fatigue and dyspnea and leg edema has improved significantly. She is using nocturnal O2 supplementation.  blood pressure is well controlled. She has new onset left bundle branch block.  This may indicate progression of cardiomyopathy. Do not think echo will help me further.   I discontinued Xarelto due to iron deficiency anemia and dark stools, Hb stable but low. She also has anemia of chronic disease from renal dysfunction as well.    I have extensively discussed with patient and her Son Madison Marquez regarding palliative care evaluation and home hospice which they are agreeable.  Advanced Care Planning performed.   "Total time spent with patient was  40 minutes regarding "end of life discussion, counseling and coordination care" . She does have a living will and will bring [place] a copy.   Adrian Prows, MD, [facility] 09/10/2019, 2:28 PM [facility]

## 2019-09-10 ENCOUNTER — Encounter: Payer: Self-pay | Admitting: Cardiology

## 2019-09-13 ENCOUNTER — Ambulatory Visit: Payer: PRIVATE HEALTH INSURANCE | Admitting: Cardiology

## 2019-09-13 DIAGNOSIS — Z1159 Encounter for screening for other viral diseases: Secondary | ICD-10-CM | POA: Diagnosis not present

## 2019-09-13 DIAGNOSIS — Z20828 Contact with and (suspected) exposure to other viral communicable diseases: Secondary | ICD-10-CM | POA: Diagnosis not present

## 2019-09-14 ENCOUNTER — Telehealth: Payer: Self-pay

## 2019-09-14 NOTE — Telephone Encounter (Signed)
ERROR. Patients question was answered. Disregard.

## 2019-09-14 NOTE — Telephone Encounter (Signed)
Telephone call to patients son to schedule palliative care visit.  Son did not answer phone and no machine to leave message. RN called patients hom enumber and left message requesting call back to schedule palliative care visit.

## 2019-09-14 NOTE — Telephone Encounter (Signed)
Telephone call to patient to schedule palliative care visit.  Patient in agreement with RN making home visit on 09/17/19 at 11:00 AM.

## 2019-09-16 DIAGNOSIS — Z23 Encounter for immunization: Secondary | ICD-10-CM | POA: Diagnosis not present

## 2019-09-17 ENCOUNTER — Other Ambulatory Visit: Payer: Medicare Other

## 2019-09-17 ENCOUNTER — Other Ambulatory Visit: Payer: Self-pay

## 2019-09-17 DIAGNOSIS — Z515 Encounter for palliative care: Secondary | ICD-10-CM

## 2019-09-17 NOTE — Progress Notes (Signed)
PATIENT NAME: Madison Marquez DOB: 07/06/1924 MRN: [id number]  PRIMARY CARE PROVIDER: Lajean Manes, MD  RESPONSIBLE PARTY:  Acct ID - Guarantor Home Phone Work Phone Relationship Acct Type  [id number] - Madison Marquez,[phone]  Self P/F     [address], [place], [place] [address]    PLAN OF CARE and INTERVENTIONS:               1.  GOALS OF CARE/ ADVANCE CARE PLANNING:  Patient states she wants to die before she loses the ability to walk.               2.  PATIENT/CAREGIVER EDUCATION: Education on fall precautions, education on s/s of fluid overload, education on s/s of infection, support               3.  DISEASE STATUS: RN made visit to facility to discuss palliative care services with patient. Patient is a 86 year old patient with but not limited to heart disease, afib, hypothyroidism, hypercholestolemia and chronic kidney disease stage  III.  Patients son-in-law Danne Marquez lives in the area as listed in Madison Marquez chart as contact. Patient has a son who lives in [place]. Patient's granddaughter Madison Marquez is supportive and visits patient weekly. Patient resided in [place] but moved here to care for her daughter who is now deceased. Patient states her goal "is to die before she can't walk." Patient sits in her chair but is able to ambulate short distances with her rollator walker. Patient reports she received Covid-19 vaccine yesterday. Patient has O2 in the home for night time use.  Patient reports she becomes extremely short of breath with minimal exertion. Patient reports she receives one meal daily from facility. Patient reports her appetite is not good. Patient reports she feels the biggest change in her condition from 6 months ago is her mobility.  Patient denies pain but reports she has some discomfort at times when she ambulates in her left leg and hip. Patient states she can hear crunching sounds when she walks in leg and hip. Patient states she will not have hip replacement due to her  age. Patient denies having any recent falls.  Patient denies having any difficulty with sleep and sleeps in her bed. Patient has 2 + edema in her ankles and currently has feet elevated. Patient takes Torsemide 20 mg daily.  Patients granddaughter fells patients med box weekly.  Patient has DNR on back of door. Patient has issues with short term memory. Patients think son-in-law is her health care power-of-attorney however she is not sure about this. Patient states she does not think she completed living will. Palliative care services explained and patient in agreement. Education to a patient on need to weigh herself daily to monitor for fluid overload. Patient also developing stage 2 breakdown on sacral area. Education to a patient to ask granddaughter to pick up [place] to apply to area. Education to patient on need to reposition frequently to prevent further skin breakdown. Patient encouraged to contact palliative care with questions or concerns.     HISTORY OF PRESENT ILLNESS: Patient is a 86 year old female who resides at [facility]  Patient open to palliative care services.  Patient will be seen monthly and PRN.    CODE STATUS: DNR  ADVANCED DIRECTIVES: HCPOA unsure about living will  MOST FORM: No PPS: 40%   PHYSICAL EXAM:   VITALS: Today's Vitals   09/17/19 1114  BP: (!) 102/44  Pulse: 61  Resp: 16  Temp: 97.7 F (36.5 C)  TempSrc: Temporal  SpO2: 98%  Weight: 147 lb (66.7 kg)  Height: 5\' 4"  (1.626 m)  PainSc: 0-No pain    LUNGS: clear to auscultation  CARDIAC: Cor RRR  EXTREMITIES: 2+ and pitting edema SKIN: stage II breakdown on sacrum area, RN recommended [place] be applied to area.  NEURO: positive for gait problems, memory problems and weakness       Madison Simmer, RN

## 2019-09-20 DIAGNOSIS — Z20828 Contact with and (suspected) exposure to other viral communicable diseases: Secondary | ICD-10-CM | POA: Diagnosis not present

## 2019-09-20 DIAGNOSIS — Z1159 Encounter for screening for other viral diseases: Secondary | ICD-10-CM | POA: Diagnosis not present

## 2019-09-21 ENCOUNTER — Ambulatory Visit: Payer: PRIVATE HEALTH INSURANCE | Admitting: Cardiology

## 2019-09-27 DIAGNOSIS — Z1159 Encounter for screening for other viral diseases: Secondary | ICD-10-CM | POA: Diagnosis not present

## 2019-09-27 DIAGNOSIS — Z20828 Contact with and (suspected) exposure to other viral communicable diseases: Secondary | ICD-10-CM | POA: Diagnosis not present

## 2019-09-28 ENCOUNTER — Other Ambulatory Visit: Payer: Self-pay | Admitting: Cardiology

## 2019-10-04 DIAGNOSIS — Z20828 Contact with and (suspected) exposure to other viral communicable diseases: Secondary | ICD-10-CM | POA: Diagnosis not present

## 2019-10-04 DIAGNOSIS — Z1159 Encounter for screening for other viral diseases: Secondary | ICD-10-CM | POA: Diagnosis not present

## 2019-10-11 DIAGNOSIS — Z20828 Contact with and (suspected) exposure to other viral communicable diseases: Secondary | ICD-10-CM | POA: Diagnosis not present

## 2019-10-11 DIAGNOSIS — Z1159 Encounter for screening for other viral diseases: Secondary | ICD-10-CM | POA: Diagnosis not present

## 2019-10-14 DIAGNOSIS — Z23 Encounter for immunization: Secondary | ICD-10-CM | POA: Diagnosis not present

## 2019-10-18 ENCOUNTER — Telehealth: Payer: Self-pay | Admitting: *Deleted

## 2019-10-18 DIAGNOSIS — Z1159 Encounter for screening for other viral diseases: Secondary | ICD-10-CM | POA: Diagnosis not present

## 2019-10-18 DIAGNOSIS — Z20828 Contact with and (suspected) exposure to other viral communicable diseases: Secondary | ICD-10-CM | POA: Diagnosis not present

## 2019-10-18 NOTE — Telephone Encounter (Signed)
Called and spoke with patient to arrange a Palliative care visit. Visit scheduled for 10/21/19 @ 1p.

## 2019-10-21 ENCOUNTER — Other Ambulatory Visit: Payer: Self-pay

## 2019-10-21 ENCOUNTER — Other Ambulatory Visit: Payer: Medicare Other | Admitting: *Deleted

## 2019-10-21 DIAGNOSIS — Z515 Encounter for palliative care: Secondary | ICD-10-CM

## 2019-10-21 NOTE — Progress Notes (Signed)
COMMUNITY PALLIATIVE CARE RN NOTE  PATIENT NAME: Madison Marquez DOB: 1924-02-05 MRN: [id number]  PRIMARY CARE PROVIDER: Lajean Manes, MD  RESPONSIBLE PARTY:  Acct ID - Guarantor Home Phone Work Phone Relationship Acct Type  [id number] - [place],[phone]  Self P/F     [address], [place], [place] [address]   Covid-19 Pre-screening Negative  PLAN OF CARE and INTERVENTION:  1. ADVANCE CARE PLANNING/GOALS OF CARE: Goal is for patient to remain at [facility]. She has a DNR. 2. PATIENT/CAREGIVER EDUCATION: Safe Mobility, symptom management 3. DISEASE STATUS: Met with patient in her apartment at [facility] facility. She is sitting up in her recliner working a crossword puzzle. She is alert and oriented x 3 and able to engage in appropriate conversation. She denies pain at this time. She does have occasional pain in her L leg, but rarely feels it is to the point where she requires Tylenol. Her breathing is regular and unlabored at this time. She does report becoming dyspneic with exertion. She uses her motorized chair when travelling outside of her room to conserve energy. She wears Oxygen during the night at 1 L/min via . She is ambulatory using a walker. She is able to dress herself independently. She requires assistance with her showers and has an aide that comes 2x/week on Wednesdays and Saturdays. She says that she has a history of falls so this is more so for safety purposes. No recent falls. She does feel tired most of the time and spends the majority of her day sitting in her recliner with her feet elevated. She is continent of both bowel and bladder. Her intake is fair. She eats about 2 meals/day. The facility brings one meal per day to patient's room, which she says is usually dinner. Denies dysphagia and is taking medications without difficulty. She reports not sleeping well last night, but usually sleeps fine. Unsure of cause. She enjoys crossword puzzles and putting puzzles  together. No skin issues. She agreeable to future Palliative care visits. Will continue to monitor.   HISTORY OF PRESENT ILLNESS: This is a 86 yo female with a history of Afib, heart disease, hypothyroidism, hypercholesterolemia, and CKD Stage III. Palliative care following patient for additional support. Will visit patient monthly and PRN.   CODE STATUS: DNR ADVANCED DIRECTIVES: Y MOST FORM: no PPS: 40%   PHYSICAL EXAM:   VITALS: Today's Vitals   10/21/19 1338  BP: (!) 108/54  Pulse: (!) 54  Resp: 18  Temp: 97.8 F (36.6 C)  TempSrc: Other (Comment)  SpO2: 100%  PainSc: 0-No pain    LUNGS: clear to auscultation  CARDIAC: Cor Loletha Grayer EXTREMITIES: 1+ edema to bilateral ankles SKIN: Exposed skin is dry and intact  NEURO: Alert and oriented x 3, forgetful at times, pleasant mood, ambulatory w/walker    (Duration of visit and documenation 60 minutes)   Daryl Eastern, RN BSN

## 2019-10-25 DIAGNOSIS — Z1159 Encounter for screening for other viral diseases: Secondary | ICD-10-CM | POA: Diagnosis not present

## 2019-10-25 DIAGNOSIS — Z20828 Contact with and (suspected) exposure to other viral communicable diseases: Secondary | ICD-10-CM | POA: Diagnosis not present

## 2019-10-26 ENCOUNTER — Other Ambulatory Visit: Payer: Self-pay | Admitting: Cardiology

## 2019-10-26 DIAGNOSIS — I5041 Acute combined systolic (congestive) and diastolic (congestive) heart failure: Secondary | ICD-10-CM

## 2019-10-26 NOTE — Telephone Encounter (Signed)
Patient has appt tomorrow, would you like to fill now or then?

## 2019-10-26 NOTE — Telephone Encounter (Signed)
She can wait

## 2019-10-27 ENCOUNTER — Ambulatory Visit: Payer: Medicare Other | Admitting: Cardiology

## 2019-10-27 ENCOUNTER — Other Ambulatory Visit: Payer: Self-pay

## 2019-10-27 ENCOUNTER — Encounter: Payer: Self-pay | Admitting: Cardiology

## 2019-10-27 VITALS — BP 124/42 | HR 58 | Temp 97.7°F | Resp 16 | Ht 64.0 in | Wt 144.8 lb

## 2019-10-27 DIAGNOSIS — G4734 Idiopathic sleep related nonobstructive alveolar hypoventilation: Secondary | ICD-10-CM

## 2019-10-27 DIAGNOSIS — Z66 Do not resuscitate: Secondary | ICD-10-CM

## 2019-10-27 DIAGNOSIS — I480 Paroxysmal atrial fibrillation: Secondary | ICD-10-CM | POA: Diagnosis not present

## 2019-10-27 DIAGNOSIS — I5042 Chronic combined systolic (congestive) and diastolic (congestive) heart failure: Secondary | ICD-10-CM | POA: Diagnosis not present

## 2019-10-27 NOTE — Progress Notes (Signed)
Primary Physician:  Lajean Manes, MD   Patient ID: Madison Marquez, female    DOB: [birth year], 86 y.o.   MRN: [id number]  Subjective:    Chief Complaint  Patient presents with  . Atrial Fibrillation  . Congestive Heart Failure    6 week follow up    HPI: Madison Marquez  is a 86 y.o. female  with chronic systolic and diastolic heart failure, CKD stage 3, hypertension, hypothyroidism, and paroxysmal atrial flutter and atrial fibrillation. Has symptomatic A.F and has had cardioversion in past and is on chronic Amiodarone and maintains sinus rhythm, has underlying marked sinus bradycardia and low blood pressure hence not been able to titrate her beta-blockers up.   This is a 6-week office visit and follow-up for acute decompensated heart failure, leg edema and worsening dyspnea and fatigue.  On her last office visit I discontinued Xarelto due to bleeding diathesis and anemia.  I also started her on nocturnal oxygen supplementation.,  Patient has noticed marked improvement in overall wellbeing.  Leg edema has completely resolved.  Presently she is back to baseline without dyspnea, chest pain or leg edema.  Her son is present.  Past Medical History:  Diagnosis Date  . Acute CHF (congestive heart failure) ([place]) 08/25/2017  . Arthritis    "probably in my right knee" (08/25/2017)  . [facility] Patient infection [phone]  . Hypercholesterolemia   . Hypothyroid   . Migraine    "I've had 1 in my lifetime" (08/25/2017)    Past Surgical History:  Procedure Laterality Date  . ABDOMINAL HYSTERECTOMY    . ANKLE FRACTURE SURGERY Right   . BLADDER SUSPENSION    . BREAST LUMPECTOMY Left   . BREAST SURGERY Left    "leaky nipple"  . CARDIOVERSION N/A 04/14/2018   Procedure: CARDIOVERSION;  Surgeon: Adrian Prows, MD;  Location: [facility] ENDOSCOPY;  Service: Cardiovascular;  Laterality: N/A;  . CARDIOVERSION N/A 05/05/2018   Procedure: CARDIOVERSION;  Surgeon: Nigel Mormon, MD;  Location: [facility] ENDOSCOPY;   Service: Cardiovascular;  Laterality: N/A;  . CARPAL TUNNEL RELEASE Left 10/15/2016   Procedure: LEFT CARPAL TUNNEL RELEASE;  Surgeon: Daryll Brod, MD;  Location: [facility];  Service: Orthopedics;  Laterality: Left;  . CATARACT EXTRACTION W/ INTRAOCULAR LENS  IMPLANT, BILATERAL Bilateral 2017  . FRACTURE SURGERY    . HERNIA REPAIR    . LEFT HEART CATH AND CORONARY ANGIOGRAPHY N/A 08/29/2017   Procedure: LEFT HEART CATH AND CORONARY ANGIOGRAPHY;  Surgeon: Adrian Prows, MD;  Location: [facility];  Service: Cardiovascular;  Laterality: N/A;  . UMBILICAL HERNIA REPAIR     Social History   Tobacco Use  . Smoking status: Never Smoker  . Smokeless tobacco: Never Used  Substance Use Topics  . Alcohol use: No    Review of Systems  Constitution: Positive for malaise/fatigue (improved).  Cardiovascular: Positive for dyspnea on exertion (chronic). Negative for chest pain and leg swelling.  Musculoskeletal: Positive for joint pain.  Gastrointestinal: Negative for melena.      Objective:  Blood pressure (!) 124/42, pulse (!) 58, temperature 97.7 F (36.5 C), temperature source Temporal, resp. rate 16, height 5' 4"  (1.626 m), weight 144 lb 12.8 oz (65.7 kg), SpO2 96 %. Body mass index is 24.85 kg/m.    Vitals with BMI 10/27/2019 10/21/2019 09/17/2019  Height 5' 4"  - 5' 4"   Weight 144 lbs 13 oz - 147 lbs  BMI 93.81 - 82.99  Systolic 371 696 789  Diastolic  42 54 44  Pulse 58 54 61    Physical Exam  Constitutional: Vital signs are normal.  petite  Cardiovascular: Normal rate, regular rhythm and intact distal pulses. Exam reveals no gallop.  Murmur heard.  Midsystolic murmur is present with a grade of 2/6 at the apex. Pulses:      Carotid pulses are 2+ on the right side and 2+ on the left side. No leg edema. JVD absent  Pulmonary/Chest: Effort normal and breath sounds normal. No accessory muscle usage. No respiratory distress.  Abdominal: Soft. Bowel sounds are normal.    Radiology: No results found.  Laboratory examination:   CMP Latest Ref Rng & Units 08/05/2019 03/25/2019 08/30/2017  Glucose 65 - 99 mg/dL 83 54(L) 85  BUN 10 - 36 mg/dL 29 18 21(H)  Creatinine 0.57 - 1.00 mg/dL 1.56(H) 1.14(H) 0.80  Sodium 134 - 144 mmol/L 141 144 137  Potassium 3.5 - 5.2 mmol/L 3.9 4.7 3.9  Chloride 96 - 106 mmol/L 97 106 104  CO2 20 - 29 mmol/L 30(H) 24 25  Calcium 8.7 - 10.3 mg/dL 9.4 9.4 8.7(L)  Total Protein 6.5 - 8.1 g/dL - - -  Total Bilirubin 0.3 - 1.2 mg/dL - - -  Alkaline Phos 38 - 126 U/L - - -  AST 15 - 41 U/L - - -  ALT 14 - 54 U/L - - -   CBC Latest Ref Rng & Units 08/11/2019 08/05/2019 08/25/2017  WBC 3.4 - 10.8 x10E3/uL - 6.2 6.0  Hemoglobin 11.1 - 15.9 g/dL 10.7(L) 10.6(L) 13.5  Hematocrit 34.0 - 46.6 % 32.8(L) 32.0(L) 41.2  Platelets 150 - 450 x10E3/uL - 250 237   Lipid Panel     Component Value Date/Time   CHOL 169 08/25/2017 1303   TRIG 51 08/25/2017 1303   HDL 61 08/25/2017 1303   CHOLHDL 2.8 08/25/2017 1303   VLDL 10 08/25/2017 1303   LDLCALC 98 08/25/2017 1303   HEMOGLOBIN A1C No results found for: HGBA1C, MPG TSH Recent Labs    08/05/19 1124  TSH 6.960*   07/13/2019: Creatinine 1.39, EGFR 35, potassium 4.1, BMP otherwise normal.  BNP 394.  RBC 3.3, hemoglobin 10.4, hematocrit 31.9, RDW 16.4%, CBC otherwise normal.  06/08/2018: Creatinine 1.02, EGFR 48/55, potassium 4.6, BMP otherwise normal.   05/01/2018: Creatinine 1.32, eGFR 35/41, Potassium 5.1, BMP otherwise normal.   Labs 08/11/2019:   Ref Range & Units 4 wk ago  Total Iron Binding Capacity 250 - 450 ug/dL 264   UIBC 118 - 369 ug/dL 239   Iron 27 - 139 ug/dL 25Low    Iron Saturation 15 - 55 % 9Low Panic      Ref Range & Units 4 wk ago 1 mo ago  BNP 0.0 - 100.0 pg/mL 421.8High   397.6H    PRN Meds:. There are no discontinued medications. Current Meds  Medication Sig  . amiodarone (PACERONE) 200 MG tablet Take 1 tablet (200 mg total) by mouth daily.  .  carvedilol (COREG) 6.25 MG tablet Take 6.25 mg by mouth 2 (two) times daily with a meal. Taking one half tab bid  . Cholecalciferol (VITAMIN D) 2000 units tablet Take 2,000 Units by mouth daily.   . Cyanocobalamin (VITAMIN B-12) 5000 MCG TBDP Take 5,000 mcg by mouth daily.   . ferrous sulfate 325 (65 FE) MG tablet Take 325 mg by mouth 3 (three) times a week. M,W,F  . levothyroxine (SYNTHROID, LEVOTHROID) 75 MCG tablet Take 75 mcg by mouth daily before  breakfast.   . metolazone (ZAROXOLYN) 5 MG tablet TAKE 1 TABLET(5 MG) BY MOUTH DAILY AS NEEDED FOR LEG SWELLING ONLY  . oxyCODONE-acetaminophen (PERCOCET/ROXICET) 5-325 MG tablet Take 1 tablet by mouth every 6 (six) hours as needed for severe pain.   Cardiac Studies:   Coronary angiogram 08/29/2017: Normal coronary arteries. Normal LV systolic function.  Direct current cardioversion 04/14/2018: A. Fibrillation: 120x1, 150x2, 150x3 to NSR.  Echocardiogram 03/05/2019: Left ventricle cavity is normal in size. Moderate concentric hypertrophy of the left ventricle. Moderate global hypokinesis.  Moderately depressed LV systolic function with visual EF 35-40%. Diastolic function not assessed due to possible atrial fibrillation. Calculated EF 35%. Left atrial cavity is mildly dilated. Trileaflet aortic valve. Mild aortic valve leaflet calcification. Moderate (Grade II) aortic regurgitation. Mild to moderate mitral regurgitation. Mild tricuspid regurgitation. Estimated pulmonary artery systolic pressure is 29 mmHg. Mild pulmonic regurgitation. No significant change compared to previous study on 08/26/2017.  Nocturnal oximetry 08/05/2019: SpO2 less than 88% 21 minutes, less than 89% 37 minutes, oxygen desaturation events 457, index 44.  Patient qualifies for oxygen supplementation by Medicare guidelines for group 1.   Assessment:     ICD-10-CM   1. Chronic combined systolic and diastolic heart failure (HCC)  I50.42   2. Paroxysmal atrial fibrillation  (HCC)  I48.0   3. Nocturnal hypoxemia  G47.34   4. DNR (do not resuscitate)  Z66    CHA2DS2-VASc Score is 5 with yearly risk of stroke of 6.7 %. (CHF; HTN; vasc disease DM, Female = 1; Age <65 =1; 65-74 = 2, >75 =3; stroke = 3).  EKG 08/11/2019: Sinus rhythm with first-degree AV block at the rate of 65 bpm, left bundle branch block.  No further analysis. Compared to 03/11/2019, left bundle branch block new. A. Fl not present.  EKG 03/11/2019: Atrial flutter with RVR at 138 bpm, left axis deviation, cannot exclude inferior infarct old. Poor R wave progression cannot exclude anterolateral infarct old. Compared to EKG 05/14/2018, A flutter is new.   Recommendations:   Madison Marquez  is a 86 y.o.  female  with chronic systolic and diastolic heart failure, CKD stage 3, hypertension, hypothyroidism, and paroxysmal atrial flutter and atrial fibrillation. Has symptomatic A.F and has had cardioversion in past and is on chronic Amiodarone, and presently maintains sinus rhythm. Has underlying marked sinus bradycardia and low blood pressure hence not been able to titrate her beta-blockers up.   I saw her 6 weeks ago, I discontinued Xarelto due to bleeding diathesis, I also prescribed her nocturnal oxygen therapy, she has noticed marked improvement in quality of life.  Strength is also improved.  Presently no leg edema, dyspnea has improved, she appears to be very well compensated with regard to congestive heart failure.  Continue present medications, she is DNR.  I will see her back in 3 months for follow-up.  Adrian Prows, MD, [facility] 10/27/2019, 2:27 PM [facility]

## 2019-10-29 ENCOUNTER — Other Ambulatory Visit: Payer: Self-pay

## 2019-10-29 ENCOUNTER — Encounter: Payer: Medicare Other | Admitting: *Deleted

## 2019-10-29 ENCOUNTER — Other Ambulatory Visit: Payer: Medicare Other | Admitting: *Deleted

## 2019-10-29 DIAGNOSIS — Z515 Encounter for palliative care: Secondary | ICD-10-CM

## 2019-11-01 DIAGNOSIS — Z1159 Encounter for screening for other viral diseases: Secondary | ICD-10-CM | POA: Diagnosis not present

## 2019-11-01 DIAGNOSIS — Z20828 Contact with and (suspected) exposure to other viral communicable diseases: Secondary | ICD-10-CM | POA: Diagnosis not present

## 2019-11-01 NOTE — Progress Notes (Signed)
COMMUNITY PALLIATIVE CARE RN NOTE  PATIENT NAME: Madison Marquez DOB: Jul 22, 1924 MRN: [id number]  PRIMARY CARE PROVIDER: Lajean Manes, MD  RESPONSIBLE PARTY:  Acct ID - Guarantor Home Phone Work Phone Relationship Acct Type  [id number] - [place],[phone]  Self P/F     [address], [place], [place] [address]   Covid-19 Pre-screening Negative  PLAN OF CARE and INTERVENTION:  1. ADVANCE CARE PLANNING/GOALS OF CARE: Goal is for [place] facility. She has a DNR. 2. PATIENT/CAREGIVER EDUCATION: Safe Mobility/transfers, symptom management 3. DISEASE STATUS: Met with patient in her [place] apartment at [facility]. Upon arrival, patient is sitting up in her recliner with her feet elevated, awake and alert. She is able to engage in appropriate conversation. Pleasant and engaging. She denies pain while sitting, but does occasionally have pain in her left leg and left hip. Breathing is regular and unlabored, but she does experience dyspnea and fatigue with exertion. She wears oxygen during the night at 1 L/min via . She is ambulatory using her Rollator walker when in her apartment. She utilizes her motorized scooter when travelling longer distances outside of her room. Her intake is poor-fair. She says she doesn't have much of an appetite because she spends most of her day sitting in her recliner and tires easily. Denies dysphagia. No bilateral lower extremity edema noted today. She requires assistance with bathing, mainly for safety purposes. She is able to perform all other ADLs independently. She has no complaints at this time. She had an appointment with her Cardiologist this week. No changes made in her plan of care. Next follow up visit will be in 3 months. Will continue to monitor.   HISTORY OF PRESENT ILLNESS:  This is a 86 yo female who resides at [facility]. Palliative care team continues to follow patient for additional support and will visit monthly and  PRN.  CODE STATUS: DNR ADVANCED DIRECTIVES: Y MOST FORM: no PPS: 40%   PHYSICAL EXAM:    LUNGS: clear to auscultation  CARDIAC: Cor RRR EXTREMITIES: No edema present SKIN: Exposed skin is dry and intact  NEURO: Alert and oriented x 3, pleasant mood, generalized weakness, ambulatory w/walker   (Duration of visit and documentation 45 minutes)   Daryl Eastern, RN BSN

## 2019-11-08 DIAGNOSIS — Z1159 Encounter for screening for other viral diseases: Secondary | ICD-10-CM | POA: Diagnosis not present

## 2019-11-08 DIAGNOSIS — Z20828 Contact with and (suspected) exposure to other viral communicable diseases: Secondary | ICD-10-CM | POA: Diagnosis not present

## 2019-11-13 IMAGING — CR DG HIP (WITH OR WITHOUT PELVIS) 2-3V LEFT
3 series · 3 of 3 positions shown · non-contrast
Comparison: None.

CLINICAL DATA: Left hip pain x1 week

EXAM:
DG HIP (WITH OR WITHOUT PELVIS) 2-3V LEFT

[pelvis ap]
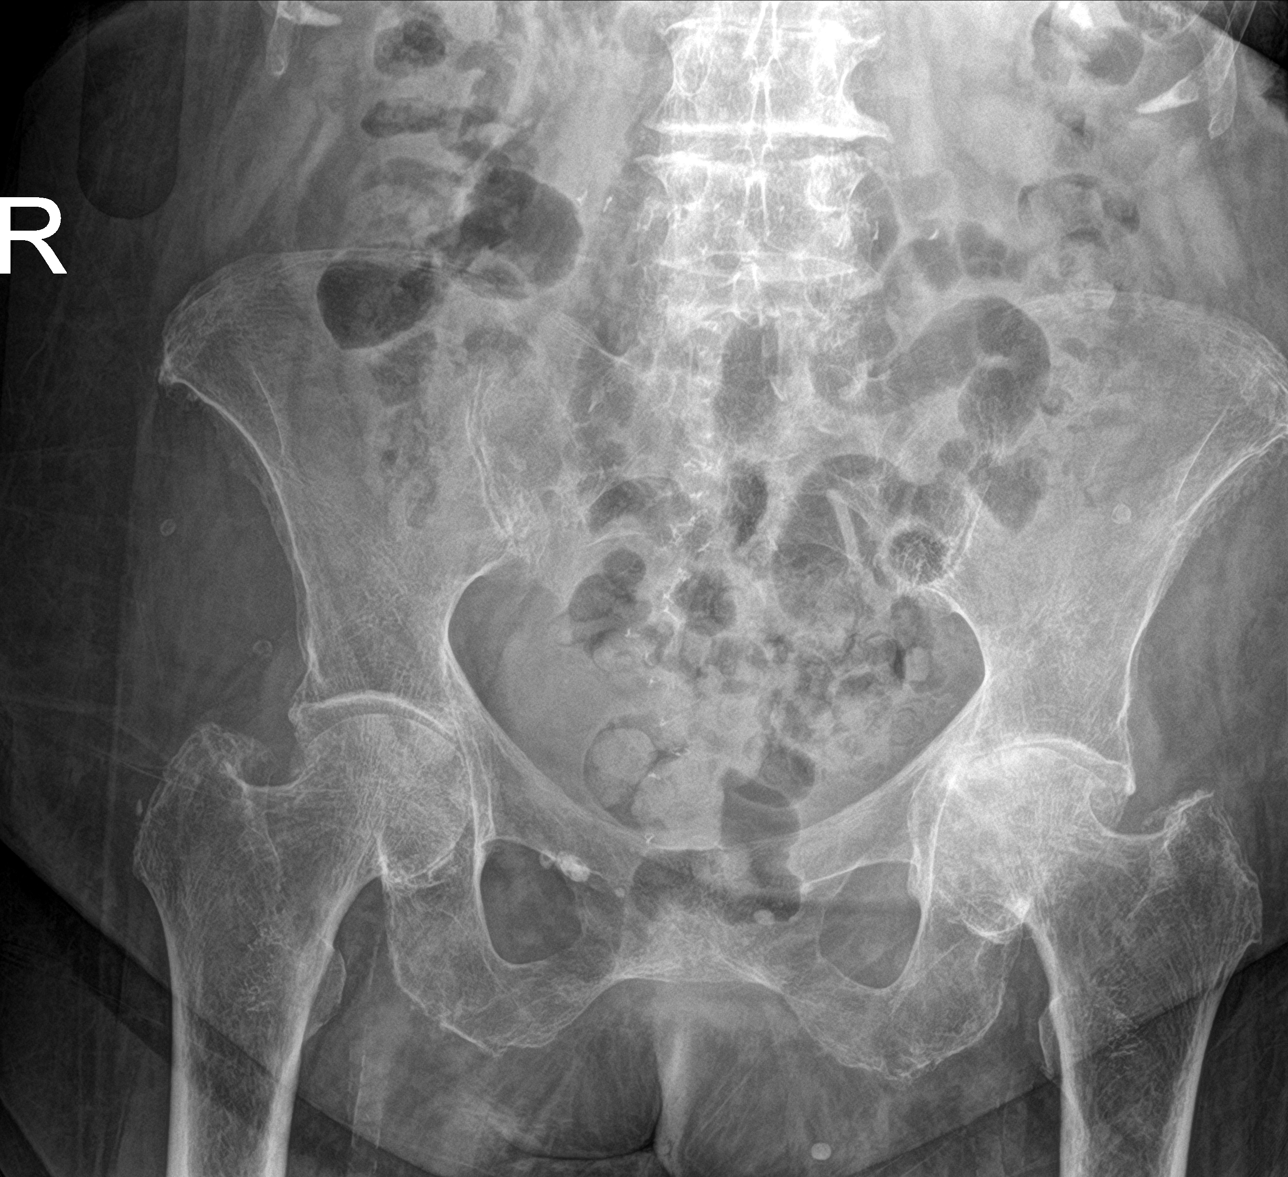

[hip ap]
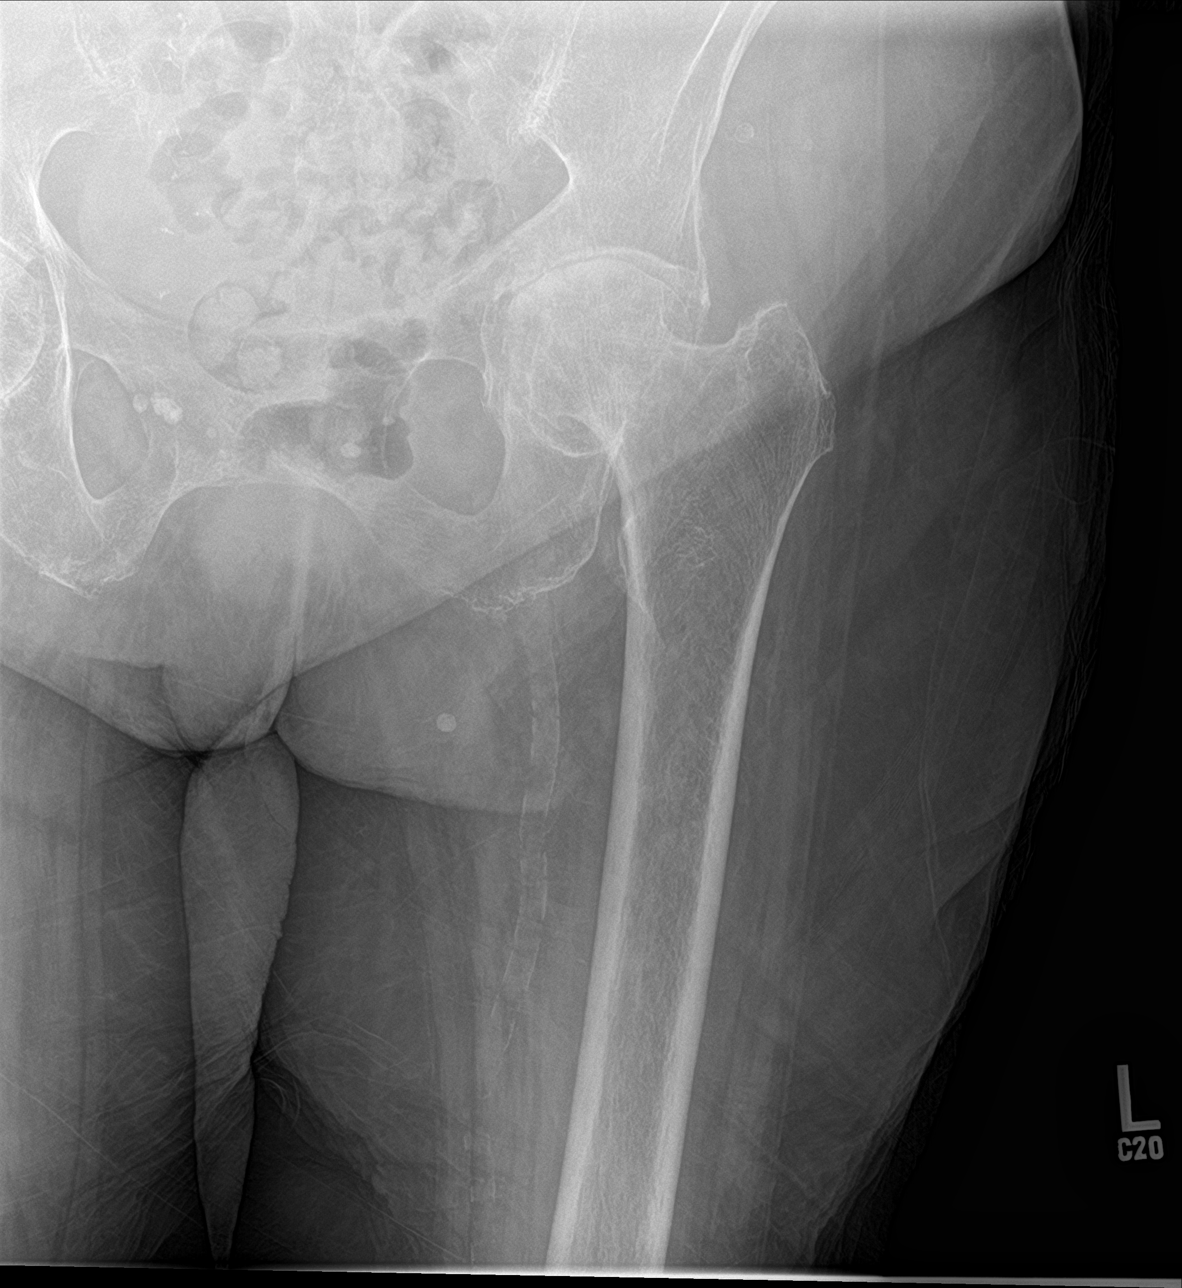

[hip lat]
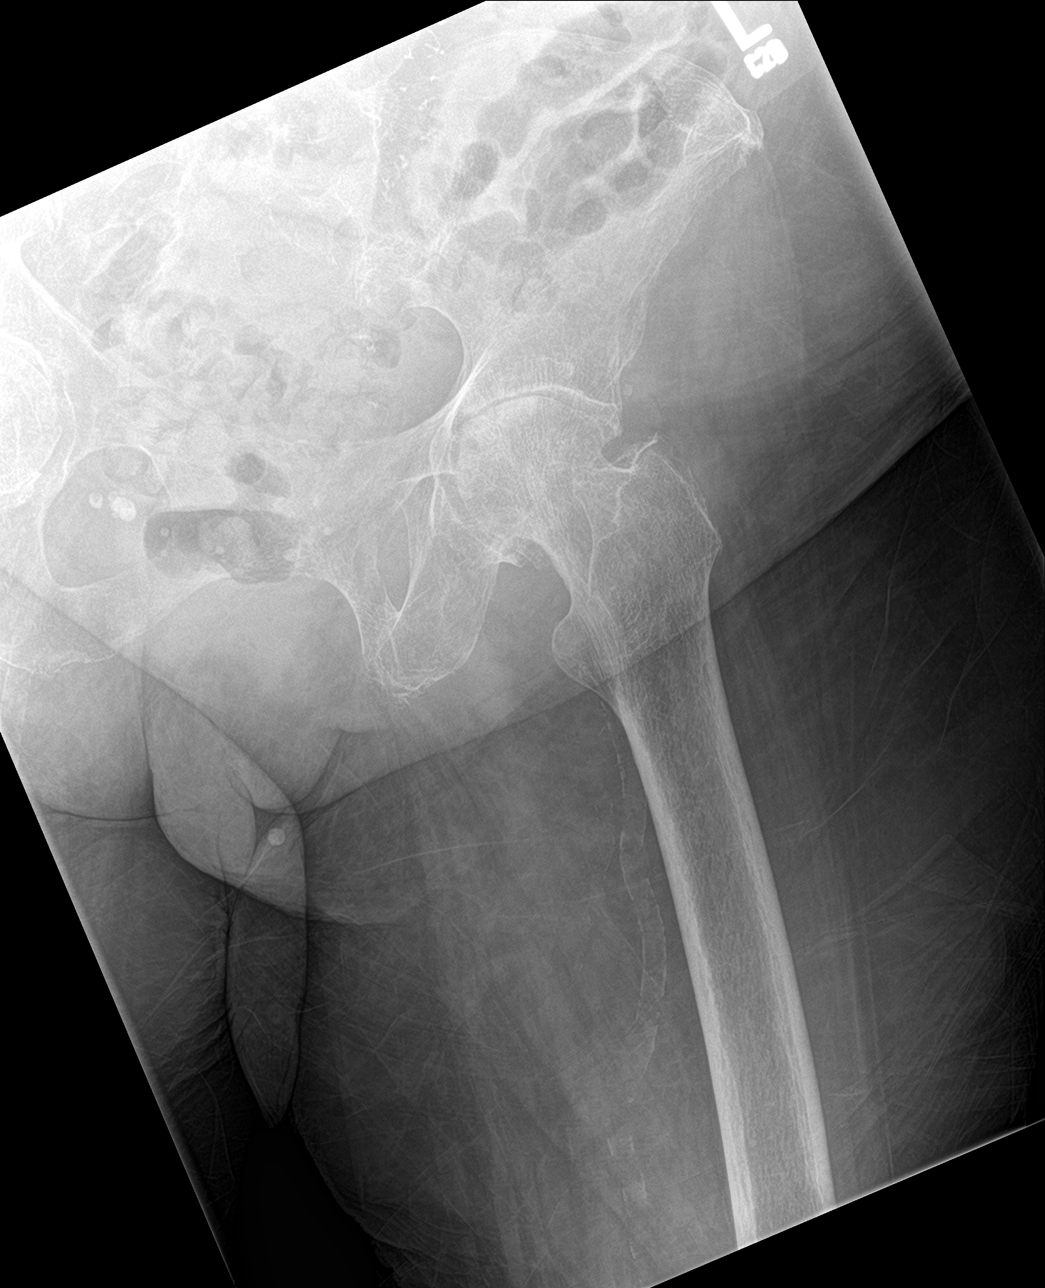

[3 of 3 positions shown; findings below may reference images not displayed]

FINDINGS: No fracture or dislocation is seen.

Moderate degenerative changes of the left hip. Mild degenerative
changes of the right hip.

Visualized bony pelvis appears intact.

Mild degenerative changes of the lower lumbar spine.
IMPRESSION: No fracture or dislocation is seen.

Moderate degenerative changes of the left hip.

Mild degenerative changes of the right hip.

## 2019-11-15 DIAGNOSIS — Z20828 Contact with and (suspected) exposure to other viral communicable diseases: Secondary | ICD-10-CM | POA: Diagnosis not present

## 2019-11-15 DIAGNOSIS — Z1159 Encounter for screening for other viral diseases: Secondary | ICD-10-CM | POA: Diagnosis not present

## 2019-11-22 DIAGNOSIS — Z20828 Contact with and (suspected) exposure to other viral communicable diseases: Secondary | ICD-10-CM | POA: Diagnosis not present

## 2019-11-22 DIAGNOSIS — Z1159 Encounter for screening for other viral diseases: Secondary | ICD-10-CM | POA: Diagnosis not present

## 2019-11-29 DIAGNOSIS — Z20828 Contact with and (suspected) exposure to other viral communicable diseases: Secondary | ICD-10-CM | POA: Diagnosis not present

## 2019-11-29 DIAGNOSIS — Z1159 Encounter for screening for other viral diseases: Secondary | ICD-10-CM | POA: Diagnosis not present

## 2019-12-06 DIAGNOSIS — Z1159 Encounter for screening for other viral diseases: Secondary | ICD-10-CM | POA: Diagnosis not present

## 2019-12-06 DIAGNOSIS — Z20828 Contact with and (suspected) exposure to other viral communicable diseases: Secondary | ICD-10-CM | POA: Diagnosis not present

## 2019-12-13 DIAGNOSIS — Z20828 Contact with and (suspected) exposure to other viral communicable diseases: Secondary | ICD-10-CM | POA: Diagnosis not present

## 2019-12-13 DIAGNOSIS — Z1159 Encounter for screening for other viral diseases: Secondary | ICD-10-CM | POA: Diagnosis not present

## 2019-12-17 ENCOUNTER — Other Ambulatory Visit: Payer: Medicare Other

## 2019-12-17 ENCOUNTER — Other Ambulatory Visit: Payer: Self-pay

## 2019-12-17 DIAGNOSIS — Z515 Encounter for palliative care: Secondary | ICD-10-CM

## 2019-12-20 DIAGNOSIS — Z20828 Contact with and (suspected) exposure to other viral communicable diseases: Secondary | ICD-10-CM | POA: Diagnosis not present

## 2019-12-20 DIAGNOSIS — Z1159 Encounter for screening for other viral diseases: Secondary | ICD-10-CM | POA: Diagnosis not present

## 2019-12-20 NOTE — Progress Notes (Signed)
COMMUNITY PALLIATIVE CARE SW NOTE  PATIENT NAME: Madison Marquez DOB: [birth year] MRN: [id number]  PRIMARY CARE PROVIDER: Lajean Manes, MD  RESPONSIBLE PARTY:  Acct ID - Guarantor Home Phone Work Phone Relationship Acct Type  [id number] - [place],[phone]  Self P/F     [address], [place], [place] [address]     PLAN OF CARE and INTERVENTIONS:             1. GOALS OF CARE/ ADVANCE CARE PLANNING: Patient is a DNR, form in apartment 2. SOCIAL/EMOTIONAL/SPIRITUAL ASSESSMENT/ INTERVENTIONS: SW completed a face-to-face visit with patient at her apartment ([place]).She was sitting in her recliner working on a puzzle. She greeted SW warmly. SW provided introductions to her as the new Education officer, museum with the palliative care program.. She shared some social history on herself, shared that she has a love for horses and described her faith as being important to her as it has sustained her through the loss of her husband, daughter and beloved farm. Patient denied pain. She states that she should be up and walking more, but uses her electric scooter to ambulate outside her apartment. Patient report that she has friends at the facility that she visits with and socialize with. Patient sat the entire visit. SW observed that patient repeated herself several times. She has private personal care support/visits for medication reminders and shower assistance. Her son lives out of state, but she talks with him regularly. SW provided introductions as new SW, supportive presence, supportive counseling, active listening, engaged patient in life review, assessed needs, comfort and coping and reassurance of support.  3. PATIENT/CAREGIVER EDUCATION/ COPING: Patient is alert and oriented to x3, but she is repeating herself intermittently. She appeared to be receptive and  openly engaged with SW. She verbalized no coping issues. Her support system included her son, neighbors and peers at the facility. She  expressed no difficulty coping. SW reinforced access to palliative care support.  4. PERSONAL EMERGENCY PLAN: 911 can be activated for any emergencies.  5. COMMUNITY RESOURCES COORDINATION/ HEALTH CARE NAVIGATION: Patient has support of activities director at the facility, peers and neighbors at the facility. She enjoys puzzles, flowers and horses. SW reinforced role of palliative care team and provided reassurance of support. 6. FINANCIAL/LEGAL CONCERNS/INTERVENTIONS: No financial or legal concerns.      SOCIAL HX:  Social History   Tobacco Use  . Smoking status: Never Smoker  . Smokeless tobacco: Never Used  Substance Use Topics  . Alcohol use: No    CODE STATUS:   Code Status: Prior DNR ADVANCED DIRECTIVES: N MOST FORM COMPLETE: No HOSPICE EDUCATION PROVIDED: No  PPS: Patient ambulates around her facility independently, but her gait is unsteady and often holds on to furniture. She utilizes an Transport planner to ambulate outside her apartment. She is alert and oriented x3, but is repeating herself. She has hired personal care assistance daily for medication reminders and assistance with personal care needs.  SW spent 60 minutes with patient to provide introductions, assessed needs, comfort, coping and safety, supportive presence, active listening, engaged patient is life review and reinforced palliative care support to her.       [address] [place], [place]

## 2019-12-24 ENCOUNTER — Other Ambulatory Visit: Payer: Self-pay

## 2019-12-24 DIAGNOSIS — I5041 Acute combined systolic (congestive) and diastolic (congestive) heart failure: Secondary | ICD-10-CM

## 2019-12-24 MED ORDER — TORSEMIDE 20 MG PO TABS: 20.0000 mg | ORAL_TABLET | Freq: Two times a day (BID) | ORAL | 2 refills | Status: DC

## 2019-12-27 DIAGNOSIS — Z20828 Contact with and (suspected) exposure to other viral communicable diseases: Secondary | ICD-10-CM | POA: Diagnosis not present

## 2019-12-27 DIAGNOSIS — Z1159 Encounter for screening for other viral diseases: Secondary | ICD-10-CM | POA: Diagnosis not present

## 2020-01-03 DIAGNOSIS — Z20828 Contact with and (suspected) exposure to other viral communicable diseases: Secondary | ICD-10-CM | POA: Diagnosis not present

## 2020-01-03 DIAGNOSIS — Z1159 Encounter for screening for other viral diseases: Secondary | ICD-10-CM | POA: Diagnosis not present

## 2020-01-10 DIAGNOSIS — Z20828 Contact with and (suspected) exposure to other viral communicable diseases: Secondary | ICD-10-CM | POA: Diagnosis not present

## 2020-01-10 DIAGNOSIS — Z1159 Encounter for screening for other viral diseases: Secondary | ICD-10-CM | POA: Diagnosis not present

## 2020-01-12 ENCOUNTER — Other Ambulatory Visit: Payer: Medicare Other | Admitting: *Deleted

## 2020-01-12 ENCOUNTER — Other Ambulatory Visit: Payer: Self-pay

## 2020-01-12 DIAGNOSIS — Z515 Encounter for palliative care: Secondary | ICD-10-CM

## 2020-01-17 DIAGNOSIS — Z1159 Encounter for screening for other viral diseases: Secondary | ICD-10-CM | POA: Diagnosis not present

## 2020-01-17 DIAGNOSIS — Z20828 Contact with and (suspected) exposure to other viral communicable diseases: Secondary | ICD-10-CM | POA: Diagnosis not present

## 2020-01-17 NOTE — Progress Notes (Signed)
COMMUNITY PALLIATIVE CARE RN NOTE  PATIENT NAME: Madison Marquez DOB: [birth year] MRN: [id number]  PRIMARY CARE PROVIDER: Lajean Manes, MD  RESPONSIBLE PARTY:  Acct ID - Guarantor Home Phone Work Phone Relationship Acct Type  [id number] - [place],[phone]  Self P/F     [address], [place], [place] [address]   Covid-19 Pre-screening Negative  PLAN OF CARE and INTERVENTION:  1. ADVANCE CARE PLANNING/GOALS OF CARE: Goal is for patient to remain at her [place] apartment at [facility]. She has a DNR. 2. PATIENT/CAREGIVER EDUCATION: Symptom management, safe mobility/transfers 3. DISEASE STATUS: Met with patient in her apartment at the facility. Upon arrival, she is sitting up in her recliner awake and alert. Mood is pleasant and engaging. She denies pain at this time. She does have infrequent pain in her left leg, but says Tylenol is effective if needed. No dyspnea noted at rest, but she does become mildly short of breath with exertion. She wears oxygen during the night at 1L/min via [place]. No edema noted today. She tries to keep her legs elevated as much as possible. She remains ambulatory, but does require some assistance with showers and medication reminders. No recent falls. She says that she never walks without using her Rollator walker for safety. She does tire easily with exertion and takes naps during the day. She says she doesn't have much of an appetite, but eats because she knows she needs to. Her intake is fair. The facility brings her one meal per day, usually dinner. Other meals, she prepares something small for herself and eats in her apartment. No swallowing difficulties. She spends most of her time in her apartment, but does have friends at the facility that she visits at times. She enjoys working puzzles. She travels outside of her apartment using her motorized chair. No skin issues at this time. Will continue to monitor.   HISTORY OF PRESENT ILLNESS: This is a 86 yo female  with a history of Afib, heart disease, hypothyroidism, hypercholesterolemia, and CKD Stage III. Palliative care following patient for additional support. Will visit patient monthly and PRN.   CODE STATUS: DNR  ADVANCED DIRECTIVES: Y MOST FORM: no PPS: 40%   PHYSICAL EXAM:   LUNGS: clear to auscultation  CARDIAC: Cor RRR EXTREMITIES: No edema SKIN: thin/frail skin; exposed skin is dry and intact  NEURO: Alert and oriented x 3, forgetful, pleasant mood, generalized weakness, ambulatory w/rollator walker   (Duration of visit and documentation 45 minutes)   Daryl Eastern, RN BSN

## 2020-01-24 DIAGNOSIS — Z20828 Contact with and (suspected) exposure to other viral communicable diseases: Secondary | ICD-10-CM | POA: Diagnosis not present

## 2020-01-24 DIAGNOSIS — Z1159 Encounter for screening for other viral diseases: Secondary | ICD-10-CM | POA: Diagnosis not present

## 2020-01-26 NOTE — Progress Notes (Signed)
Primary Physician/Referring:  Lajean Manes, MD  Patient ID: Madison Marquez, female    DOB: 08-Apr-1924, 86 y.o.   MRN: [id number]  Chief Complaint  Patient presents with  . Follow-up    3 month  . Congestive Heart Failure   HPI:    Madison Marquez  is a 86 y.o. female  with chronic systolic and diastolic heart failure, CKD stage 3, hypertension, hypothyroidism, and paroxysmal atrial flutter and atrial fibrillation. Has symptomatic A.F and has had cardioversion in past and is on chronic Amiodarone and maintains sinus rhythm, has underlying sinus bradycardia and low blood pressure hence not been able to titrate her beta-blockers up.   She now presents for a 4-monthoffice visit for follow-up of congestive heart failure.  On the present medical therapy including home oxygen, she has noticed marked improvement in overall wellbeing and also has not had any further PND or orthopnea or leg edema.  Today she has no specific complaints except for generalized fatigue and also chronic dyspnea.   Past Medical History:  Diagnosis Date  . Acute CHF (congestive heart failure) ([place] 08/25/2017  . Arthritis    "probably in my right knee" (08/25/2017)  . ER[place] Patientinfection [phone] . Hypercholesterolemia   . Hypothyroid   . Migraine    "I've had 1 in my lifetime" (08/25/2017)   Past Surgical History:  Procedure Laterality Date  . ABDOMINAL HYSTERECTOMY    . ANKLE FRACTURE SURGERY Right   . BLADDER SUSPENSION    . BREAST LUMPECTOMY Left   . BREAST SURGERY Left    "leaky nipple"  . CARDIOVERSION N/A 04/14/2018   Procedure: CARDIOVERSION;  Surgeon: GAdrian Prows MD;  [facility];  Service: Cardiovascular;  Laterality: N/A;  . CARDIOVERSION N/A 05/05/2018   Procedure: CARDIOVERSION;  Surgeon: PNigel Mormon MD;  [facility];  Service: Cardiovascular;  Laterality: N/A;  . CARPAL TUNNEL RELEASE Left 10/15/2016   Procedure: LEFT CARPAL TUNNEL RELEASE;  Surgeon: GDaryll Brod MD;  [place] [place]  Service: Orthopedics;  Laterality: Left;  . CATARACT EXTRACTION W/ INTRAOCULAR LENS  IMPLANT, BILATERAL Bilateral 2017  . FRACTURE SURGERY    . HERNIA REPAIR    . LEFT HEART CATH AND CORONARY ANGIOGRAPHY N/A 08/29/2017   Procedure: LEFT HEART CATH AND CORONARY ANGIOGRAPHY;  Surgeon: GAdrian Prows MD;  [facility];  Service: Cardiovascular;  Laterality: N/A;  . UMBILICAL HERNIA REPAIR     Family History  Problem Relation Age of Onset  . Heart failure Brother   . Diabetes Brother   . Cancer Sister     Social History   Tobacco Use  . Smoking status: Never Smoker  . Smokeless tobacco: Never Used  Substance Use Topics  . Alcohol use: No   Marital Status: Widowed  ROS  Review of Systems  Constitution: Positive for malaise/fatigue (improved).  Cardiovascular: Positive for dyspnea on exertion (chronic). Negative for chest pain and leg swelling.  Musculoskeletal: Positive for joint pain.  Gastrointestinal: Negative for melena.   Objective  Blood pressure (!) 124/53, pulse 61, resp. rate 16, height 5' 4"  (1.626 m), weight 142 lb (64.4 kg), SpO2 96 %.  Vitals with BMI 01/27/2020 10/29/2019 10/27/2019  Height 5' 4"  - 5' 4"   Weight 142 lbs - 144 lbs 13 oz  BMI 268.03- 221.22 Systolic [id number] Diastolic 53 58 42  Pulse 61 60 58     Physical Exam  Constitutional:  Vital signs are normal.  petite  Cardiovascular: Normal rate, regular rhythm and intact distal pulses. Exam reveals no gallop.  Murmur heard.  Midsystolic murmur is present with a grade of 2/6 at the apex. Pulses:      Carotid pulses are 2+ on the right side and 2+ on the left side. No leg edema. JVD absent  Pulmonary/Chest: Effort normal and breath sounds normal. No accessory muscle usage. No respiratory distress.  Abdominal: Soft. Bowel sounds are normal.   Laboratory examination:   Recent Labs    03/25/19 1026 08/05/19 1124  NA 144 141  K 4.7 3.9  CL  106 97  CO2 24 30*  GLUCOSE 54* 83  BUN 18 29  CREATININE 1.14* 1.56*  CALCIUM 9.4 9.4  GFRNONAA 42* 28*  GFRAA 48* 33*   CrCl cannot be calculated (Patient's most recent lab result is older than the maximum 21 days allowed.).  CMP Latest Ref Rng & Units 08/05/2019 03/25/2019 08/30/2017  Glucose 65 - 99 mg/dL 83 54(L) 85  BUN 10 - 36 mg/dL 29 18 21(H)  Creatinine 0.57 - 1.00 mg/dL 1.56(H) 1.14(H) 0.80  Sodium 134 - 144 mmol/L 141 144 137  Potassium 3.5 - 5.2 mmol/L 3.9 4.7 3.9  Chloride 96 - 106 mmol/L 97 106 104  CO2 20 - 29 mmol/L 30(H) 24 25  Calcium 8.7 - 10.3 mg/dL 9.4 9.4 8.7(L)  Total Protein 6.5 - 8.1 g/dL - - -  Total Bilirubin 0.3 - 1.2 mg/dL - - -  Alkaline Phos 38 - 126 U/L - - -  AST 15 - 41 U/L - - -  ALT 14 - 54 U/L - - -   CBC Latest Ref Rng & Units 08/11/2019 08/05/2019 08/25/2017  WBC 3.4 - 10.8 x10E3/uL - 6.2 6.0  Hemoglobin 11.1 - 15.9 g/dL 10.7(L) 10.6(L) 13.5  Hematocrit 34.0 - 46.6 % 32.8(L) 32.0(L) 41.2  Platelets 150 - 450 x10E3/uL - 250 237    Lipid Panel Lab Results  Component Value Date   CHOL 169 08/25/2017   Lab Results  Component Value Date   HDL 61 08/25/2017   Lab Results  Component Value Date   LDLCALC 98 08/25/2017   Lab Results  Component Value Date   TRIG 51 08/25/2017   Lab Results  Component Value Date   CHOLHDL 2.8 08/25/2017   No results found for: LDLDIRECT     Component Value Date/Time   CHOL 169 08/25/2017 1303   TRIG 51 08/25/2017 1303   HDL 61 08/25/2017 1303   CHOLHDL 2.8 08/25/2017 1303   VLDL 10 08/25/2017 1303   LDLCALC 98 08/25/2017 1303   HEMOGLOBIN A1C No results found for: HGBA1C, MPG TSH Recent Labs    08/05/19 1124  TSH 6.960*    External labs:  05/01/2018: Creatinine 1.32, eGFR 35/41, Potassium 5.1, BMP otherwise normal.   06/08/2018: Creatinine 1.02, EGFR 48/55, potassium 4.6, BMP otherwise normal.   07/13/2019: Creatinine 1.39, EGFR 35, potassium 4.1, BMP otherwise normal.  BNP 394.   RBC 3.3, hemoglobin 10.4, hematocrit 31.9, RDW 16.4%, CBC otherwise normal.  08/11/2019:   Ref Range & Units 4 wk ago  Total Iron Binding Capacity 250 - 450 ug/dL 264   UIBC 118 - 369 ug/dL 239   Iron 27 - 139 ug/dL 25Low    Iron Saturation 15 - 55 % 9Low Panic      Ref Range & Units 4 wk ago 1 mo ago  BNP 0.0 - 100.0 pg/mL 421.8High  397.6H    Medications and allergies  No Known Allergies   Current Outpatient Medications  Medication Instructions  . amiodarone (PACERONE) 200 mg, Oral, Daily  . carvedilol (COREG) 6.25 mg, Oral, 2 times daily with meals, Taking one half tab bid   . furosemide (LASIX) 20 mg, Oral, Daily  . levothyroxine (SYNTHROID) 75 mcg, Oral, Daily before breakfast  . metolazone (ZAROXOLYN) 5 MG tablet TAKE 1 TABLET(5 MG) BY MOUTH DAILY AS NEEDED FOR LEG SWELLING ONLY  . oxyCODONE-acetaminophen (PERCOCET/ROXICET) 5-325 MG tablet 1 tablet, Oral, Every 6 hours PRN  . torsemide (DEMADEX) 20 mg, Oral, 2 times daily at 10am and 4pm  . Vitamin B-12 5,000 mcg, Oral, Daily  . Vitamin D 2,000 Units, Oral, Daily   Radiology:   No results found.  Cardiac Studies:   Coronary angiogram 08/29/2017: Normal coronary arteries. Normal LV systolic function.  Direct current cardioversion 04/14/2018: A. Fibrillation: 120x1, 150x2, 150x3 to NSR.  Echocardiogram 03/05/2019: Left ventricle cavity is normal in size. Moderate concentric hypertrophy of the left ventricle. Moderate global hypokinesis.  Moderately depressed LV systolic function with visual EF 35-40%. Diastolic function not assessed due to possible atrial fibrillation. Calculated EF 35%. Left atrial cavity is mildly dilated. Trileaflet aortic valve. Mild aortic valve leaflet calcification. Moderate (Grade II) aortic regurgitation. Mild to moderate mitral regurgitation. Mild tricuspid regurgitation. Estimated pulmonary artery systolic pressure is 29 mmHg. Mild pulmonic regurgitation. No significant change compared to  previous study on 08/26/2017.  Nocturnal oximetry 08/05/2019: SpO2 less than 88% 21 minutes, less than 89% 37 minutes, oxygen desaturation events 457, index 44.  Patient qualifies for oxygen supplementation by Medicare guidelines for group 1.    EKG  EKG 01/27/2020: Probably ectopic atrial rhythm at the rate of 62 bpm, left bundle branch block.  Low-voltage complexes.  No further analysis.      08/11/2019: Sinus rhythm with first-degree AV block at the rate of 65 bpm, left bundle branch block.  No further analysis. Compared to 03/11/2019, left bundle branch block new. A. Fl not present.  03/11/2019: Atrial flutter with RVR at 138 bpm, left axis deviation, cannot exclude inferior infarct old. Poor R wave progression cannot exclude anterolateral infarct old. Compared to EKG 05/14/2018, A flutter is new.   Assessment     ICD-10-CM   1. Chronic combined systolic and diastolic heart failure (HCC)  I50.42 EKG 12-Lead  2. Paroxysmal atrial fibrillation (HCC)  I48.0   3. DNR (do not resuscitate)  Z66   4. Stage 3a chronic kidney disease  N18.31      Outpatient Encounter Medications as of 01/27/2020  Medication Sig  . amiodarone (PACERONE) 200 MG tablet Take 1 tablet (200 mg total) by mouth daily.  . carvedilol (COREG) 6.25 MG tablet Take 6.25 mg by mouth 2 (two) times daily with a meal. Taking one half tab bid  . Cholecalciferol (VITAMIN D) 2000 units tablet Take 2,000 Units by mouth daily.   . Cyanocobalamin (VITAMIN B-12) 5000 MCG TBDP Take 5,000 mcg by mouth daily.   Marland Kitchen levothyroxine (SYNTHROID, LEVOTHROID) 75 MCG tablet Take 75 mcg by mouth daily before breakfast.   . metolazone (ZAROXOLYN) 5 MG tablet TAKE 1 TABLET(5 MG) BY MOUTH DAILY AS NEEDED FOR LEG SWELLING ONLY  . oxyCODONE-acetaminophen (PERCOCET/ROXICET) 5-325 MG tablet Take 1 tablet by mouth every 6 (six) hours as needed for severe pain.  . furosemide (LASIX) 20 MG tablet Take 20 mg by mouth daily.  Marland Kitchen torsemide (DEMADEX) 20 MG  tablet Take 1  tablet (20 mg total) by mouth 2 (two) times daily at 10 am and 4 pm. (Patient not taking: Reported on 01/27/2020)  . [DISCONTINUED] ferrous sulfate 325 (65 FE) MG tablet Take 325 mg by mouth 3 (three) times a week. M,W,F   No facility-administered encounter medications on file as of 01/27/2020.     Recommendations:   Madison Marquez  is a 86 y.o. female  with chronic systolic and diastolic heart failure, CKD stage 3, hypertension, hypothyroidism, and paroxysmal atrial flutter and atrial fibrillation. Has symptomatic AF and has had cardioversion in past and is on chronic Amiodarone, and presently maintains sinus rhythm/ectopic atrial rhythm. Has underlying sinus bradycardia and low blood pressure hence not been able to titrate her beta-blockers up.   She is presently on conservative therapy and DNR orders have been placed.  She is off of anticoagulation due to risk of fall and patient's choice.  Since being on home oxygen, she is feeling the best she has in quite a while.  Today there is no clinical evidence of heart failure, no leg edema, lungs are clear.  Blood pressure is well controlled, she is tolerating amiodarone and low-dose carvedilol without any side effects.  Continue the same.  I would like to see him back in 6 months for follow-up.  We again discussed goals of therapy and end-of-life issues.  He has been an honor to take care of Ms. Sebald.  Adrian Prows, MD, [facility] 01/27/2020, 2:03 PM [facility] [place] Pager: [phone] Office: [phone]

## 2020-01-27 ENCOUNTER — Ambulatory Visit: Payer: Medicare Other | Admitting: Cardiology

## 2020-01-27 ENCOUNTER — Other Ambulatory Visit: Payer: Self-pay

## 2020-01-27 ENCOUNTER — Encounter: Payer: Self-pay | Admitting: Cardiology

## 2020-01-27 VITALS — BP 124/53 | HR 61 | Resp 16 | Ht 64.0 in | Wt 142.0 lb

## 2020-01-27 DIAGNOSIS — I5042 Chronic combined systolic (congestive) and diastolic (congestive) heart failure: Secondary | ICD-10-CM

## 2020-01-27 DIAGNOSIS — Z66 Do not resuscitate: Secondary | ICD-10-CM

## 2020-01-27 DIAGNOSIS — I5043 Acute on chronic combined systolic (congestive) and diastolic (congestive) heart failure: Secondary | ICD-10-CM | POA: Diagnosis not present

## 2020-01-27 DIAGNOSIS — I480 Paroxysmal atrial fibrillation: Secondary | ICD-10-CM

## 2020-01-27 DIAGNOSIS — N1831 Chronic kidney disease, stage 3a: Secondary | ICD-10-CM | POA: Diagnosis not present

## 2020-01-28 ENCOUNTER — Other Ambulatory Visit: Payer: Self-pay | Admitting: Cardiology

## 2020-01-28 DIAGNOSIS — I5041 Acute combined systolic (congestive) and diastolic (congestive) heart failure: Secondary | ICD-10-CM

## 2020-01-31 DIAGNOSIS — Z1159 Encounter for screening for other viral diseases: Secondary | ICD-10-CM | POA: Diagnosis not present

## 2020-01-31 DIAGNOSIS — Z20828 Contact with and (suspected) exposure to other viral communicable diseases: Secondary | ICD-10-CM | POA: Diagnosis not present

## 2020-02-07 DIAGNOSIS — Z1159 Encounter for screening for other viral diseases: Secondary | ICD-10-CM | POA: Diagnosis not present

## 2020-02-07 DIAGNOSIS — Z20828 Contact with and (suspected) exposure to other viral communicable diseases: Secondary | ICD-10-CM | POA: Diagnosis not present

## 2020-02-09 DIAGNOSIS — E039 Hypothyroidism, unspecified: Secondary | ICD-10-CM | POA: Diagnosis not present

## 2020-02-09 DIAGNOSIS — I700 Atherosclerosis of aorta: Secondary | ICD-10-CM | POA: Diagnosis not present

## 2020-02-09 DIAGNOSIS — Z0000 Encounter for general adult medical examination without abnormal findings: Secondary | ICD-10-CM | POA: Diagnosis not present

## 2020-02-09 DIAGNOSIS — I5022 Chronic systolic (congestive) heart failure: Secondary | ICD-10-CM | POA: Diagnosis not present

## 2020-02-09 DIAGNOSIS — F5101 Primary insomnia: Secondary | ICD-10-CM | POA: Diagnosis not present

## 2020-02-09 DIAGNOSIS — Z1389 Encounter for screening for other disorder: Secondary | ICD-10-CM | POA: Diagnosis not present

## 2020-02-09 DIAGNOSIS — Z79899 Other long term (current) drug therapy: Secondary | ICD-10-CM | POA: Diagnosis not present

## 2020-02-09 DIAGNOSIS — I480 Paroxysmal atrial fibrillation: Secondary | ICD-10-CM | POA: Diagnosis not present

## 2020-02-09 DIAGNOSIS — R600 Localized edema: Secondary | ICD-10-CM | POA: Diagnosis not present

## 2020-02-14 DIAGNOSIS — Z20828 Contact with and (suspected) exposure to other viral communicable diseases: Secondary | ICD-10-CM | POA: Diagnosis not present

## 2020-02-14 DIAGNOSIS — Z1159 Encounter for screening for other viral diseases: Secondary | ICD-10-CM | POA: Diagnosis not present

## 2020-02-21 DIAGNOSIS — Z20828 Contact with and (suspected) exposure to other viral communicable diseases: Secondary | ICD-10-CM | POA: Diagnosis not present

## 2020-02-21 DIAGNOSIS — Z1159 Encounter for screening for other viral diseases: Secondary | ICD-10-CM | POA: Diagnosis not present

## 2020-02-22 ENCOUNTER — Other Ambulatory Visit: Payer: Self-pay

## 2020-02-22 ENCOUNTER — Non-Acute Institutional Stay: Payer: Medicare Other

## 2020-02-22 DIAGNOSIS — Z515 Encounter for palliative care: Secondary | ICD-10-CM

## 2020-02-23 NOTE — Progress Notes (Signed)
COMMUNITY PALLIATIVE CARE SW NOTE  PATIENT NAME: Madison Marquez DOB: Jan 19, 1924 MRN: [id number]  PRIMARY CARE PROVIDER: Lajean Manes, MD  RESPONSIBLE PARTY:  Acct ID - Guarantor Home Phone Work Phone Relationship Acct Type  [id number] - [place],[phone]  Self P/F     [place], [place], [place] [address]     PLAN OF CARE and INTERVENTIONS:             1. GOALS OF CARE/ ADVANCE CARE PLANNING:  Patient is a DNR, form in apartment. 2. SOCIAL/EMOTIONAL/SPIRITUAL ASSESSMENT/ INTERVENTIONS:   SW completed a face-to-face visit with patient at her apartment ([place]).She was sitting in her recliner working watching TV and holding her cat. She greeted SW warmly.  Patient denied pain. She states that she should be up and walking more, but uses her electric scooter to ambulate outside her apartment. Patient continues to have friends at the facility that she visits with and socialize with. Patient sat the entire visit. SW observed that patient repeated herself several times. She has personal care support/visits for medication reminders and shower assistance. SW provided supportive presence, supportive counseling, active listening, engaged patient in life review, assessed needs, comfort and coping and reassurance of support.  3. PATIENT/CAREGIVER EDUCATION/ COPING:  Patient is alert and oriented to x3, but she is repeating herself intermittently and seem to be forgetful. She verbalized no coping issues. Her support system included her son, neighbors and peers at the facility. She expressed no difficulty coping. SW reinforced access to palliative care support.  4. PERSONAL EMERGENCY PLAN:  Per facility protocol. 5. COMMUNITY RESOURCES COORDINATION/ HEALTH CARE NAVIGATION:  Patient has support of activities director at the facility, peers and neighbors at the facility. She continues to do puzzles, flower arrangements and horses. SW reinforced role of palliative care team and provided  reassurance of support. 6. FINANCIAL/LEGAL CONCERNS/INTERVENTIONS:  No legal or financial concerns.      SOCIAL HX:  Social History   Tobacco Use  . Smoking status: Never Smoker  . Smokeless tobacco: Never Used  Substance Use Topics  . Alcohol use: No    CODE STATUS: DNR ADVANCED DIRECTIVES: No MOST FORM COMPLETE:  No HOSPICE EDUCATION PROVIDED: No  PPS: Patient ambulates around her facility independently, but her gait is unsteady and often holds on to furniture. She utilizes an Transport planner to ambulate outside her apartment. She is alert and oriented x3, but is forgetful and repeating herself. She receives personal care assistance daily for medication reminders and assistance with personal care needs.  Duration of visit and documentation: 60 minutes     Katheren Puller, LCSW

## 2020-02-28 DIAGNOSIS — Z20828 Contact with and (suspected) exposure to other viral communicable diseases: Secondary | ICD-10-CM | POA: Diagnosis not present

## 2020-02-28 DIAGNOSIS — Z1159 Encounter for screening for other viral diseases: Secondary | ICD-10-CM | POA: Diagnosis not present

## 2020-03-06 DIAGNOSIS — Z20828 Contact with and (suspected) exposure to other viral communicable diseases: Secondary | ICD-10-CM | POA: Diagnosis not present

## 2020-03-06 DIAGNOSIS — Z1159 Encounter for screening for other viral diseases: Secondary | ICD-10-CM | POA: Diagnosis not present

## 2020-03-07 ENCOUNTER — Other Ambulatory Visit: Payer: Self-pay

## 2020-03-07 MED ORDER — AMIODARONE HCL 200 MG PO TABS: 200.0000 mg | ORAL_TABLET | Freq: Every day | ORAL | 3 refills | Status: DC

## 2020-03-07 NOTE — Telephone Encounter (Signed)
Can you please refill her Amiodarone

## 2020-03-09 DIAGNOSIS — D509 Iron deficiency anemia, unspecified: Secondary | ICD-10-CM | POA: Diagnosis not present

## 2020-03-09 DIAGNOSIS — I5032 Chronic diastolic (congestive) heart failure: Secondary | ICD-10-CM | POA: Diagnosis not present

## 2020-03-09 DIAGNOSIS — I480 Paroxysmal atrial fibrillation: Secondary | ICD-10-CM | POA: Diagnosis not present

## 2020-03-09 DIAGNOSIS — M810 Age-related osteoporosis without current pathological fracture: Secondary | ICD-10-CM | POA: Diagnosis not present

## 2020-03-09 DIAGNOSIS — I5022 Chronic systolic (congestive) heart failure: Secondary | ICD-10-CM | POA: Diagnosis not present

## 2020-03-09 DIAGNOSIS — G459 Transient cerebral ischemic attack, unspecified: Secondary | ICD-10-CM | POA: Diagnosis not present

## 2020-03-09 DIAGNOSIS — E039 Hypothyroidism, unspecified: Secondary | ICD-10-CM | POA: Diagnosis not present

## 2020-03-13 DIAGNOSIS — Z20828 Contact with and (suspected) exposure to other viral communicable diseases: Secondary | ICD-10-CM | POA: Diagnosis not present

## 2020-03-13 DIAGNOSIS — Z1159 Encounter for screening for other viral diseases: Secondary | ICD-10-CM | POA: Diagnosis not present

## 2020-03-20 DIAGNOSIS — Z1159 Encounter for screening for other viral diseases: Secondary | ICD-10-CM | POA: Diagnosis not present

## 2020-03-20 DIAGNOSIS — Z20828 Contact with and (suspected) exposure to other viral communicable diseases: Secondary | ICD-10-CM | POA: Diagnosis not present

## 2020-03-23 ENCOUNTER — Non-Acute Institutional Stay: Payer: Medicare Other

## 2020-03-23 ENCOUNTER — Other Ambulatory Visit: Payer: Self-pay

## 2020-03-23 DIAGNOSIS — Z515 Encounter for palliative care: Secondary | ICD-10-CM

## 2020-03-25 NOTE — Progress Notes (Signed)
COMMUNITY PALLIATIVE CARE SW NOTE  PATIENT NAME: Madison Marquez DOB: [birth year] MRN: [id number]  PRIMARY CARE PROVIDER: Lajean Manes, MD  RESPONSIBLE PARTY:  Acct ID - Guarantor Home Phone Work Phone Relationship Acct Type  [id number] - [place],[phone]  Self P/F     [place], [place], [place] [address]     PLAN OF CARE and INTERVENTIONS:             1. GOALS OF CARE/ ADVANCE CARE PLANNING:  Goal is for patient to remain as independent as possible. Patient is a DNR, form in the apartment. 2. SOCIAL/EMOTIONAL/SPIRITUAL ASSESSMENT/ INTERVENTIONS:  SW completed a face-to-face visit with patient at her apartment ([place]). Patient was sitting in her recliner. Her granddaughter-Michelle was visiting with her, but left shortly after SW arrived. She denied pain, but stated that she was not feeling well. She is having increased fatigue and weakness. Her appetite remains poor and report that she eats because she knows she needs to. Patient was working on puzzles, but states she has no interest in it now. Patient contributes this getting old. She states that she finds herself, sitting in her recliner, watching TV with her cat. Patient is content with this. She has good support from family and staff will also check-in on her. Patient was engaged with SW. SW engaged her in open dialogue, while assessing her needs and comfort and provide supportive presence. SW will continue to visit patient minimally 1x month and PRN to assess psychosocial needs and comfort. 3. PATIENT/CAREGIVER EDUCATION/ COPING: Patient is alert and oriented to x3, but she is repeating herself intermittently and seem to be forgetful. She verbalized no coping issues. Her support system included her son, neighbors and peers at the facility. She expressed no difficulty coping. SW reinforced access to palliative care support.  4. PERSONAL EMERGENCY PLAN:  Per facility protocol. 5. COMMUNITY RESOURCES COORDINATION/  HEALTH CARE NAVIGATION:  Patient has personal care assistance through facility program.  6. FINANCIAL/LEGAL CONCERNS/INTERVENTIONS:  No legal or financial concerns.      SOCIAL HX:  Social History   Tobacco Use  . Smoking status: Never Smoker  . Smokeless tobacco: Never Used  Substance Use Topics  . Alcohol use: No    CODE STATUS: DNR ADVANCED DIRECTIVES: No MOST FORM COMPLETE:  No HOSPICE EDUCATION PROVIDED: No  PPS: Patient ambulates around her facility independently, but her gait is unsteady and often holds on to furniture. She utilizes an Transport planner to ambulate outside her apartment. She is alert and oriented x3, but is forgetful and repeating herself. She receives personal care assistance daily for medication reminders and assistance with personal care needs.  Duration of visit and documentation: 45 minutes.      [address] [place], [place]

## 2020-03-27 DIAGNOSIS — Z1159 Encounter for screening for other viral diseases: Secondary | ICD-10-CM | POA: Diagnosis not present

## 2020-03-27 DIAGNOSIS — Z20828 Contact with and (suspected) exposure to other viral communicable diseases: Secondary | ICD-10-CM | POA: Diagnosis not present

## 2020-04-03 DIAGNOSIS — Z20828 Contact with and (suspected) exposure to other viral communicable diseases: Secondary | ICD-10-CM | POA: Diagnosis not present

## 2020-04-03 DIAGNOSIS — Z1159 Encounter for screening for other viral diseases: Secondary | ICD-10-CM | POA: Diagnosis not present

## 2020-04-10 DIAGNOSIS — Z20828 Contact with and (suspected) exposure to other viral communicable diseases: Secondary | ICD-10-CM | POA: Diagnosis not present

## 2020-04-10 DIAGNOSIS — Z1159 Encounter for screening for other viral diseases: Secondary | ICD-10-CM | POA: Diagnosis not present

## 2020-04-17 DIAGNOSIS — Z1159 Encounter for screening for other viral diseases: Secondary | ICD-10-CM | POA: Diagnosis not present

## 2020-04-17 DIAGNOSIS — Z20828 Contact with and (suspected) exposure to other viral communicable diseases: Secondary | ICD-10-CM | POA: Diagnosis not present

## 2020-04-20 ENCOUNTER — Other Ambulatory Visit: Payer: Self-pay

## 2020-04-20 ENCOUNTER — Non-Acute Institutional Stay: Payer: Medicare Other

## 2020-04-20 DIAGNOSIS — Z515 Encounter for palliative care: Secondary | ICD-10-CM

## 2020-04-23 NOTE — Progress Notes (Signed)
COMMUNITY PALLIATIVE CARE SW NOTE  PATIENT NAME: Madison Marquez DOB: 1923/09/03 MRN: [id number]  PRIMARY CARE PROVIDER: Lajean Manes, MD  RESPONSIBLE PARTY:  Acct ID - Guarantor Home Phone Work Phone Relationship Acct Type  [id number] - [place],[phone]  Self P/F     [place], [place],  [address]     PLAN OF CARE and INTERVENTIONS:             1. GOALS OF CARE/ ADVANCE CARE PLANNING:  Goal is for patient to remain as independent as possible. Patient is a DNR. 2. SOCIAL/EMOTIONAL/SPIRITUAL ASSESSMENT/ INTERVENTIONS:   SW completed a face-to-face visit with patient at her apartment ([place]). Patient was sitting in a chair, eating her breakfast. Patient stated that she did not sleep well last night because her knee was bothering her. She stated that this was new for her, but felt some relief after taking an extra strength Tylenol. She is having increased fatigue and weakness. Her appetite is fair. She continues to ambulate with her walker within her apartment. Patient does not go out at all. She states that her interest in completing her puzzles have decreased because of her vision worsening. Patient completed life review through pictures and antique pieces in her room. Patient continues to have a personal care assistance to come in to help her with showers and medication reminders. No other concerns noted. SW engaged her in open dialogue, while assessing her needs and comfort and provide supportive presence. SW will continue to assess patient psychosocial needs and provide support. 3. PATIENT/CAREGIVER EDUCATION/ COPING:  Patient is alert and oriented to x3, but she is repeating herself intermittentlyand seem to be forgetful. Her affect was flat.She verbalized no coping issues. Her support system included her granddaughter, neighbors and peers at the facility. She expressed no difficulty coping. SW reinforced access to palliative care support. 4. PERSONAL  EMERGENCY PLAN:  Per facility protocol.  5. COMMUNITY RESOURCES COORDINATION/ HEALTH CARE NAVIGATION:   Patient has personal care assistance through the facility assistance program.  6. FINANCIAL/LEGAL CONCERNS/INTERVENTIONS:  None.     SOCIAL HX:  Social History   Tobacco Use  . Smoking status: Never Smoker  . Smokeless tobacco: Never Used  Substance Use Topics  . Alcohol use: No    CODE STATUS: DNR ADVANCED DIRECTIVES: No MOST FORM COMPLETE:  No HOSPICE EDUCATION PROVIDED: No  PPS: Patient ambulates around her facility independently, but her gait is unsteady and often holds on to furniture. She is alert and oriented x3, but isforgetful andrepeating herself. She receivespersonal care assistance daily for medication reminders and assistance with personal care needs.  Duration of visit and documentation: 45 minutes.      [address] [place], [place]

## 2020-04-24 DIAGNOSIS — Z20828 Contact with and (suspected) exposure to other viral communicable diseases: Secondary | ICD-10-CM | POA: Diagnosis not present

## 2020-04-24 DIAGNOSIS — Z1159 Encounter for screening for other viral diseases: Secondary | ICD-10-CM | POA: Diagnosis not present

## 2020-05-01 DIAGNOSIS — Z1159 Encounter for screening for other viral diseases: Secondary | ICD-10-CM | POA: Diagnosis not present

## 2020-05-01 DIAGNOSIS — Z20828 Contact with and (suspected) exposure to other viral communicable diseases: Secondary | ICD-10-CM | POA: Diagnosis not present

## 2020-05-08 DIAGNOSIS — Z20828 Contact with and (suspected) exposure to other viral communicable diseases: Secondary | ICD-10-CM | POA: Diagnosis not present

## 2020-05-08 DIAGNOSIS — Z1159 Encounter for screening for other viral diseases: Secondary | ICD-10-CM | POA: Diagnosis not present

## 2020-05-10 ENCOUNTER — Other Ambulatory Visit: Payer: Medicare Other

## 2020-05-10 DIAGNOSIS — Z515 Encounter for palliative care: Secondary | ICD-10-CM

## 2020-05-11 ENCOUNTER — Other Ambulatory Visit: Payer: Self-pay

## 2020-05-11 NOTE — Progress Notes (Signed)
COMMUNITY PALLIATIVE CARE SW NOTE  PATIENT NAME: Madison Marquez DOB: Mar 11, 1924 MRN: [id number]  PRIMARY CARE PROVIDER: Lajean Manes, MD  RESPONSIBLE PARTY:  Acct ID - Guarantor Home Phone Work Phone Relationship Acct Type  [id number] - [place],[phone]  Self P/F     [place], [place], [place] [address]     PLAN OF CARE and INTERVENTIONS:             1. GOALS OF CARE/ ADVANCE CARE PLANNING:  Goal is for patient to remain as independent as possible. Patient is a DNR. 2. SOCIAL/EMOTIONAL/SPIRITUAL ASSESSMENT/ INTERVENTIONS:  SW completed a face-to face visit with patient at her apartment at [place] Patient was coming out of the bathroom. She stated that took a shower, by herself as her personal care aide did not come. She stated that she took her time and did well. She walked back to her chair with her walker. Patient was engaged and talked with SW about her children, her farm and the fact that she was ready to be with her husband when the [facility] is ready for her. She expressed that she was not afraid to die stating " I've had a good life". Patient expressed that she missed her son and hoped that she would see him soon, now that the pandemic is over. Patient report that her appetite remains good. She continues to have fatigue and weakness. She does not go out of her room or out of the facility and is content with this. Patient continues to have daily personal care assistance and medication reminders. SW engaged her in open dialogue, while assessing her needs and comfort, supportive presence, social stimulation, and life review. SW will continue to assess patient psychosocial needs and provide support.  3. PATIENT/CAREGIVER EDUCATION/ COPING:  Patient is alert and oriented x3. She seemed to be in a good mood and more engaged than recent visits. She seems to be coping well. She had a supportive family.  4. PERSONAL EMERGENCY PLAN:  Per facility protocol. 5. COMMUNITY RESOURCES  COORDINATION/ HEALTH CARE NAVIGATION:  Patient has personal care assistance through the facility assistance program. 6. FINANCIAL/LEGAL CONCERNS/INTERVENTIONS:  None.     SOCIAL HX:  Social History   Tobacco Use  . Smoking status: Never Smoker  . Smokeless tobacco: Never Used  Substance Use Topics  . Alcohol use: No    CODE STATUS: DNR ADVANCED DIRECTIVES: No MOST FORM COMPLETE:  No HOSPICE EDUCATION PROVIDED: No  PPS: Patient ambulates around her apartment with a walker but her gait is unsteady. She is alert and oriented x3, but isforgetful. She receivespersonal care assistance for daily  medication reminders and assistance with personal care needs.  Duration of visit and documentation: 60 minutes.      [address] [place], [place]

## 2020-05-22 DIAGNOSIS — Z20828 Contact with and (suspected) exposure to other viral communicable diseases: Secondary | ICD-10-CM | POA: Diagnosis not present

## 2020-05-22 DIAGNOSIS — Z1159 Encounter for screening for other viral diseases: Secondary | ICD-10-CM | POA: Diagnosis not present

## 2020-05-23 DIAGNOSIS — Z23 Encounter for immunization: Secondary | ICD-10-CM | POA: Diagnosis not present

## 2020-05-29 DIAGNOSIS — Z20828 Contact with and (suspected) exposure to other viral communicable diseases: Secondary | ICD-10-CM | POA: Diagnosis not present

## 2020-05-29 DIAGNOSIS — Z1159 Encounter for screening for other viral diseases: Secondary | ICD-10-CM | POA: Diagnosis not present

## 2020-06-05 DIAGNOSIS — Z1159 Encounter for screening for other viral diseases: Secondary | ICD-10-CM | POA: Diagnosis not present

## 2020-06-05 DIAGNOSIS — Z20828 Contact with and (suspected) exposure to other viral communicable diseases: Secondary | ICD-10-CM | POA: Diagnosis not present

## 2020-06-12 DIAGNOSIS — Z20828 Contact with and (suspected) exposure to other viral communicable diseases: Secondary | ICD-10-CM | POA: Diagnosis not present

## 2020-06-12 DIAGNOSIS — Z1159 Encounter for screening for other viral diseases: Secondary | ICD-10-CM | POA: Diagnosis not present

## 2020-06-13 DIAGNOSIS — E039 Hypothyroidism, unspecified: Secondary | ICD-10-CM | POA: Diagnosis not present

## 2020-06-13 DIAGNOSIS — I5032 Chronic diastolic (congestive) heart failure: Secondary | ICD-10-CM | POA: Diagnosis not present

## 2020-06-13 DIAGNOSIS — M810 Age-related osteoporosis without current pathological fracture: Secondary | ICD-10-CM | POA: Diagnosis not present

## 2020-06-13 DIAGNOSIS — I480 Paroxysmal atrial fibrillation: Secondary | ICD-10-CM | POA: Diagnosis not present

## 2020-06-13 DIAGNOSIS — I5022 Chronic systolic (congestive) heart failure: Secondary | ICD-10-CM | POA: Diagnosis not present

## 2020-06-13 DIAGNOSIS — G459 Transient cerebral ischemic attack, unspecified: Secondary | ICD-10-CM | POA: Diagnosis not present

## 2020-06-13 DIAGNOSIS — D509 Iron deficiency anemia, unspecified: Secondary | ICD-10-CM | POA: Diagnosis not present

## 2020-06-19 ENCOUNTER — Other Ambulatory Visit: Payer: Self-pay

## 2020-06-19 ENCOUNTER — Other Ambulatory Visit: Payer: Medicare Other | Admitting: *Deleted

## 2020-06-19 DIAGNOSIS — Z515 Encounter for palliative care: Secondary | ICD-10-CM

## 2020-06-19 DIAGNOSIS — Z20828 Contact with and (suspected) exposure to other viral communicable diseases: Secondary | ICD-10-CM | POA: Diagnosis not present

## 2020-06-19 DIAGNOSIS — Z1159 Encounter for screening for other viral diseases: Secondary | ICD-10-CM | POA: Diagnosis not present

## 2020-06-19 NOTE — Progress Notes (Signed)
COMMUNITY PALLIATIVE CARE RN NOTE  PATIENT NAME: Madison Marquez DOB: Apr 28, 1924 MRN: [id number]  PRIMARY CARE PROVIDER: Lajean Manes, MD  RESPONSIBLE PARTY:  Acct ID - Guarantor Home Phone Work Phone Relationship Acct Type  [id number] - [place],[phone]  Self P/F     [address], [place], [place] [address]   Covid-19 Pre-screening Negative  PLAN OF CARE and INTERVENTION:  1. ADVANCE CARE PLANNING/GOALS OF CARE: Goal is for patient to remain in her [place] apartment at [facility]. She has a DNR. 2. PATIENT/CAREGIVER EDUCATION: Symptom management, safe mobility/transfers 3. DISEASE STATUS: Met with patient in her [place] apartment. Upon arrival, she is sitting up in her recliner with her legs elevated. She is alert and oriented x 4. Pleasant mood and engaging. She denies pain at this time, but does experience pain in her left leg usually with standing/ambulation. She does take Tylenol as needed to help. She reports that she has been slightly more short of breath for the past few days while sitting. She does wear supplemental oxygen during the night. Her lungs are clear to auscultation and no shortness of breath noted during visit. She says that she will utilize her oxygen during the day if she needs to. She is ambulatory using her walker. She says she still manages her own personal care. She says that she is very careful not to fall. She does have some difficulty standing from her chair. I reinforced that she is sitting in a lift chair and that she could elevate it to help her stand better. She eats all meals in her room at the dining table. She says her intake is fair, but has declined some. She is usually not hungry but eats because she knows she needs to. She does have trace edema noted in her ankles. 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 She is not interested in travelling outside of her apartment. She does have friends who visit throughout the day. She has a motorized  scooter that she can use if she wants to travel outside of her room. Will continue to monitor.   HISTORY OF PRESENT ILLNESS: This is a 86 yo female with a history of Afib, heart disease, hypothyroidism, hypercholesterolemia, and CKD Stage III. Palliative care following patient for additional support. Will visit patient monthly and PRN.   CODE STATUS: DNR ADVANCED DIRECTIVES: Y MOST FORM: no PPS: 50%   PHYSICAL EXAM:   LUNGS: clear to auscultation  CARDIAC: Cor RRR EXTREMITIES: Trace ankle edema SKIN: Some slight bluish/purple discoloration noted around her ankles, she denies any skin breakdown  NEURO: Alert and oriented x 4, pleasant mood, generalized weakness, ambulatory w/walker    (Duration of visit and documentation 45 minutes)   Daryl Eastern, RN BSN

## 2020-06-26 DIAGNOSIS — Z1159 Encounter for screening for other viral diseases: Secondary | ICD-10-CM | POA: Diagnosis not present

## 2020-06-26 DIAGNOSIS — Z20828 Contact with and (suspected) exposure to other viral communicable diseases: Secondary | ICD-10-CM | POA: Diagnosis not present

## 2020-07-03 DIAGNOSIS — Z20828 Contact with and (suspected) exposure to other viral communicable diseases: Secondary | ICD-10-CM | POA: Diagnosis not present

## 2020-07-03 DIAGNOSIS — Z1159 Encounter for screening for other viral diseases: Secondary | ICD-10-CM | POA: Diagnosis not present

## 2020-07-10 DIAGNOSIS — Z1159 Encounter for screening for other viral diseases: Secondary | ICD-10-CM | POA: Diagnosis not present

## 2020-07-10 DIAGNOSIS — Z20828 Contact with and (suspected) exposure to other viral communicable diseases: Secondary | ICD-10-CM | POA: Diagnosis not present

## 2020-07-17 ENCOUNTER — Other Ambulatory Visit: Payer: Self-pay

## 2020-07-17 ENCOUNTER — Other Ambulatory Visit: Payer: Medicare Other

## 2020-07-17 DIAGNOSIS — Z1159 Encounter for screening for other viral diseases: Secondary | ICD-10-CM | POA: Diagnosis not present

## 2020-07-17 DIAGNOSIS — Z20828 Contact with and (suspected) exposure to other viral communicable diseases: Secondary | ICD-10-CM | POA: Diagnosis not present

## 2020-07-17 DIAGNOSIS — Z515 Encounter for palliative care: Secondary | ICD-10-CM

## 2020-07-21 NOTE — Progress Notes (Signed)
COMMUNITY PALLIATIVE CARE SW NOTE  PATIENT NAME: Madison Marquez DOB: Apr 18, 1924 MRN: [id number]  PRIMARY CARE PROVIDER: Lajean Manes, MD  RESPONSIBLE PARTY:  Acct ID - Guarantor Home Phone Work Phone Relationship Acct Type  [id number] - [place],[id number]  Self P/F     [address], [place], [place] [address]     PLAN OF CARE and INTERVENTIONS:             1. GOALS OF CARE/ ADVANCE CARE PLANNING:  Goals is for patient to remain in her independent apartment. Patient is a DNR.  2. SOCIAL/EMOTIONAL/SPIRITUAL ASSESSMENT/ INTERVENTIONS:  SW completed a visit with patient at her apartment. She was present in her recliner. She denied pain, but reported intermittent pain to her legs and hips. Patient report that her pain is great in her hips and legs where it is difficult for her to walk and even turn over in the bed at times. She sit in her recliner with her feet elevated to relieve her pain. She states that elevating her legs and feet helps a lot. She continues to do her own personal care, but states she takes her time to prevent falls. Patient report that she does have occasional standby assistance from a hired caregiver for showers. She report that her appetite remains fair, but she does not have much of an appetite. She does not go outside of her apartment, but is planning to go to her granddaughter's for Thanksgiving. She is worried about ability to ambulate due to pain and weakness in her legs and plan to take her wheelchair. 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 SW provided active listening, supportive presence, encouraged safety, provided reassurance of support and engaged patient in life review. SW will continue to assess patient psychosocial needs and provide support.   3. PATIENT/CAREGIVER EDUCATION/ COPING:  Patient is alert and oriented x3, but repeats herself. She has a supportive granddaughter. 4. PERSONAL EMERGENCY PLAN:  Per facility protocol.   5. COMMUNITY RESOURCES COORDINATION/ HEALTH CARE NAVIGATION:  Patient has personal care assistance through [facility] at the facility.  6. FINANCIAL/LEGAL CONCERNS/INTERVENTIONS:  None.     SOCIAL HX:  Social History   Tobacco Use  . Smoking status: Never Smoker  . Smokeless tobacco: Never Used  Substance Use Topics  . Alcohol use: No    CODE STATUS: DNR ADVANCED DIRECTIVES: No MOST FORM COMPLETE:  No HOSPICE EDUCATION PROVIDED: No  PPS: Patient ambulates around her apartment with a walker but her gait is unsteady and she is having increased pain and weakness. She is alert and oriented x3, but isforgetful. She receivespersonal care assistance for daily  medication reminders and assistance with personal care needs.  Duration of visit and documentation: 60 minutes.      [address] [place], [place]

## 2020-07-24 DIAGNOSIS — Z1159 Encounter for screening for other viral diseases: Secondary | ICD-10-CM | POA: Diagnosis not present

## 2020-07-24 DIAGNOSIS — Z20828 Contact with and (suspected) exposure to other viral communicable diseases: Secondary | ICD-10-CM | POA: Diagnosis not present

## 2020-07-28 ENCOUNTER — Other Ambulatory Visit: Payer: Self-pay

## 2020-07-28 ENCOUNTER — Ambulatory Visit: Payer: Medicare Other | Admitting: Cardiology

## 2020-07-28 ENCOUNTER — Encounter: Payer: Self-pay | Admitting: Cardiology

## 2020-07-28 VITALS — BP 142/55 | HR 60 | Ht 64.0 in | Wt 149.0 lb

## 2020-07-28 DIAGNOSIS — G4734 Idiopathic sleep related nonobstructive alveolar hypoventilation: Secondary | ICD-10-CM | POA: Diagnosis not present

## 2020-07-28 DIAGNOSIS — N1831 Chronic kidney disease, stage 3a: Secondary | ICD-10-CM

## 2020-07-28 DIAGNOSIS — I5042 Chronic combined systolic (congestive) and diastolic (congestive) heart failure: Secondary | ICD-10-CM

## 2020-07-28 DIAGNOSIS — Z5181 Encounter for therapeutic drug level monitoring: Secondary | ICD-10-CM | POA: Diagnosis not present

## 2020-07-28 DIAGNOSIS — I480 Paroxysmal atrial fibrillation: Secondary | ICD-10-CM | POA: Diagnosis not present

## 2020-07-28 DIAGNOSIS — Z66 Do not resuscitate: Secondary | ICD-10-CM | POA: Diagnosis not present

## 2020-07-28 MED ORDER — AMIODARONE HCL 200 MG PO TABS: 100.0000 mg | ORAL_TABLET | Freq: Every day | ORAL | 3 refills | Status: DC

## 2020-07-28 NOTE — Progress Notes (Signed)
Primary Physician/Referring:  Lajean Manes, MD  Patient ID: Madison Marquez, female    DOB: June 11, 1924, 86 y.o.   MRN: [id number]  Chief Complaint  Patient presents with   Follow-up    6 month   Chronic combined systolic and diastolic heart failure   HPI:    Madison Marquez  is a 86 y.o. female  with chronic systolic and diastolic heart failure, CKD stage 3, hypertension, hypothyroidism, and paroxysmal atrial flutter and atrial fibrillation and is on chronic Amiodarone and maintains sinus rhythm, has underlying sinus bradycardia and low blood pressure hence not been able to titrate her beta-blockers up.   She now presents for a 6 month office visit for follow-up of congestive heart failure.  On the present medical therapy including home oxygen, she has noticed marked improvement in overall wellbeing and also has not had any further PND or orthopnea or leg edema.  Today she has no specific complaints.  Past Medical History:  Diagnosis Date   Acute CHF (congestive heart failure) ([facility]) 08/25/2017   Arthritis    "probably in my right knee" (08/25/2017)   [facility] Patient infection 1950s   Hypercholesterolemia    Hypothyroid    Migraine    "I've had 1 in my lifetime" (08/25/2017)   Past Surgical History:  Procedure Laterality Date   ABDOMINAL HYSTERECTOMY     ANKLE FRACTURE SURGERY Right    BLADDER SUSPENSION     BREAST LUMPECTOMY Left    BREAST SURGERY Left    "leaky nipple"   CARDIOVERSION N/A 04/14/2018   Procedure: CARDIOVERSION;  Surgeon: Adrian Prows, MD;  Location: [facility];  Service: Cardiovascular;  Laterality: N/A;   CARDIOVERSION N/A 05/05/2018   Procedure: CARDIOVERSION;  Surgeon: Nigel Mormon, MD;  Location: [facility];  Service: Cardiovascular;  Laterality: N/A;   CARPAL TUNNEL RELEASE Left 10/15/2016   Procedure: LEFT CARPAL TUNNEL RELEASE;  Surgeon: Daryll Brod, MD;  Location: [facility];  Service: Orthopedics;  Laterality:  Left;   CATARACT EXTRACTION W/ INTRAOCULAR LENS  IMPLANT, BILATERAL Bilateral 2017   FRACTURE SURGERY     HERNIA REPAIR     LEFT HEART CATH AND CORONARY ANGIOGRAPHY N/A 08/29/2017   Procedure: LEFT HEART CATH AND CORONARY ANGIOGRAPHY;  Surgeon: Adrian Prows, MD;  Location: [facility];  Service: Cardiovascular;  Laterality: N/A;   UMBILICAL HERNIA REPAIR     Family History  Problem Relation Age of Onset   Heart failure Brother    Diabetes Brother    Cancer Sister     Social History   Tobacco Use   Smoking status: Never Smoker   Smokeless tobacco: Never Used  Substance Use Topics   Alcohol use: No   Marital Status: Widowed  ROS  Review of Systems  Constitutional: Negative for malaise/fatigue.  Cardiovascular: Positive for dyspnea on exertion (chronic, stable). Negative for chest pain and leg swelling.  Musculoskeletal: Positive for joint pain.  Gastrointestinal: Negative for melena.   Objective  Blood pressure (!) 142/55, pulse 60, height _0  (1.626 m), weight 149 lb (67.6 kg).  Vitals with BMI 07/28/2020 01/27/2020 10/29/2019  Height _1  _2  -  Weight 149 lbs 142 lbs -  BMI 21.19 41.74 -  Systolic 081 448 185  Diastolic 55 53 58  Pulse 60 61 60     Physical Exam Constitutional:      Comments: petite  Cardiovascular:     Rate and Rhythm: Normal rate and regular rhythm.  Pulses: Intact distal pulses.          Carotid pulses are 2+ on the right side and 2+ on the left side.    Heart sounds: Murmur heard.  Midsystolic murmur is present with a grade of 2/6 at the apex.  No gallop.      Comments: No leg edema. JVD absent Pulmonary:     Effort: Pulmonary effort is normal. No accessory muscle usage or respiratory distress.     Breath sounds: Normal breath sounds.  Abdominal:     General: Bowel sounds are normal.     Palpations: Abdomen is soft.    Laboratory examination:   Recent Labs    08/05/19 1124  NA 141  K 3.9  CL 97  CO2 30*   GLUCOSE 83  BUN 29  CREATININE 1.56*  CALCIUM 9.4  GFRNONAA 28*  GFRAA 33*   CrCl cannot be calculated (Patient's most recent lab result is older than the maximum 21 days allowed.).  CMP Latest Ref Rng & Units 08/05/2019 03/25/2019 08/30/2017  Glucose 65 - 99 mg/dL 83 54(L) 85  BUN 10 - 36 mg/dL 29 18 21(H)  Creatinine 0.57 - 1.00 mg/dL 1.56(H) 1.14(H) 0.80  Sodium 134 - 144 mmol/L 141 144 137  Potassium 3.5 - 5.2 mmol/L 3.9 4.7 3.9  Chloride 96 - 106 mmol/L 97 106 104  CO2 20 - 29 mmol/L 30(H) 24 25  Calcium 8.7 - 10.3 mg/dL 9.4 9.4 8.7(L)  Total Protein 6.5 - 8.1 g/dL - - -  Total Bilirubin 0.3 - 1.2 mg/dL - - -  Alkaline Phos 38 - 126 U/L - - -  AST 15 - 41 U/L - - -  ALT 14 - 54 U/L - - -   CBC Latest Ref Rng & Units 08/11/2019 08/05/2019 08/25/2017  WBC 3.4 - 10.8 x10E3/uL - 6.2 6.0  Hemoglobin 11.1 - 15.9 g/dL 10.7(L) 10.6(L) 13.5  Hematocrit 34.0 - 46.6 % 32.8(L) 32.0(L) 41.2  Platelets 150 - 450 x10E3/uL - 250 237      Component Value Date/Time   CHOL 169 08/25/2017 1303   TRIG 51 08/25/2017 1303   HDL 61 08/25/2017 1303   CHOLHDL 2.8 08/25/2017 1303   VLDL 10 08/25/2017 1303   LDLCALC 98 08/25/2017 1303   TSH Recent Labs    08/05/19 1124  TSH 6.960*   BNP (last 3 results) Recent Labs    08/05/19 1124 08/11/19 1324  BNP 397.6* 421.8*    ProBNP (last 3 results) No results for input(s): PROBNP in the last 8760 hours.    External labs:     Creatinine, Serum 1.360 mg/ 02/09/2020 Potassium 4.000 mm 02/09/2020   07/13/2019: Creatinine 1.39, EGFR 35, potassium 4.1, BMP otherwise normal.  BNP 394.  RBC 3.3, hemoglobin 10.4, hematocrit 31.9, RDW 16.4%, CBC otherwise normal.  Medications and allergies  No Known Allergies   Current Outpatient Medications on File Prior to Visit  Medication Sig Dispense Refill   carvedilol (COREG) 6.25 MG tablet Take 6.25 mg by mouth 2 (two) times daily with a meal. Taking one half tab bid     Cholecalciferol (VITAMIN  D) 2000 units tablet Take 2,000 Units by mouth daily.      Cyanocobalamin (VITAMIN B-12) 5000 MCG TBDP Take 5,000 mcg by mouth daily.      furosemide (LASIX) 20 MG tablet Take 20 mg by mouth as needed.      levothyroxine (SYNTHROID, LEVOTHROID) 75 MCG tablet Take 75 mcg by mouth daily  before breakfast.      oxyCODONE-acetaminophen (PERCOCET/ROXICET) 5-325 MG tablet Take 1 tablet by mouth every 6 (six) hours as needed for severe pain. 15 tablet 0   No current facility-administered medications on file prior to visit.    Radiology:   No results found.  Cardiac Studies:   Coronary angiogram 08/29/2017: Normal coronary arteries. Normal LV systolic function.  Direct current cardioversion 04/14/2018: A. Fibrillation: 120x1, 150x2, 150x3 to NSR.  Echocardiogram 03/05/2019: Left ventricle cavity is normal in size. Moderate concentric hypertrophy of the left ventricle. Moderate global hypokinesis.  Moderately depressed LV systolic function with visual EF 35-40%. Diastolic function not assessed due to possible atrial fibrillation. Calculated EF 35%. Left atrial cavity is mildly dilated. Trileaflet aortic valve. Mild aortic valve leaflet calcification. Moderate (Grade II) aortic regurgitation. Mild to moderate mitral regurgitation. Mild tricuspid regurgitation. Estimated pulmonary artery systolic pressure is 29 mmHg. Mild pulmonic regurgitation. No significant change compared to previous study on 08/26/2017.  Nocturnal oximetry 08/05/2019: SpO2 less than 88% 21 minutes, less than 89% 37 minutes, oxygen desaturation events 457, index 44.  Patient qualifies for oxygen supplementation by Medicare guidelines for group 1.    EKG    EKG 07/28/2020: Probably sinus versus ectopic atrial rhythm at rate of 60 bpm, left bundle branch block. No further analysis.  No significant change from EKG 01/27/2020   08/11/2019: Sinus rhythm with first-degree AV block at the rate of 65 bpm, left bundle branch  block.  No further analysis. Compared to 03/11/2019, left bundle branch block new. A. Fl not present.  03/11/2019: Atrial flutter with RVR at 138 bpm, left axis deviation, cannot exclude inferior infarct old. Poor R wave progression cannot exclude anterolateral infarct old. Compared to EKG 05/14/2018, A flutter is new.   Assessment     ICD-10-CM   1. Chronic combined systolic and diastolic heart failure (HCC)  I50.42 EKG 12-Lead    CBC    CMP14+EGFR    TSH  2. Paroxysmal atrial fibrillation (HCC)  I48.0 amiodarone (PACERONE) 200 MG tablet  3. Nocturnal hypoxemia  G47.34   4. Stage 3a chronic kidney disease (HCC)  N18.31   5. DNR (do not resuscitate)  Z66   6. Therapeutic drug monitoring  Z51.81 TSH    Meds ordered this encounter  Medications   amiodarone (PACERONE) 200 MG tablet    Sig: Take 0.5 tablets (100 mg total) by mouth daily.    Dispense:  60 tablet    Refill:  3    Medications Discontinued During This Encounter  Medication Reason   metolazone (ZAROXOLYN) 5 MG tablet No longer needed (for PRN medications)   torsemide (DEMADEX) 20 MG tablet No longer needed (for PRN medications)   amiodarone (PACERONE) 200 MG tablet     Recommendations:   Madison Marquez  is a 86 y.o. female  with chronic systolic and diastolic heart failure, CKD stage 3, hypertension, hypothyroidism, and paroxysmal atrial flutter and atrial fibrillation. Has symptomatic AF and has had cardioversion in past and is on chronic Amiodarone, and presently maintains sinus rhythm/ectopic atrial rhythm. Has underlying sinus bradycardia and low blood pressure hence not been able to titrate her beta-blockers up.   She is presently on conservative therapy and DNR orders have been placed.  She is off of anticoagulation due to risk of fall and patient's choice.  Since being on home oxygen, she is feeling the best she has in quite a while.    Today there is no clinical evidence of  heart failure, no leg edema, lungs  are clear.  Blood pressure is well controlled, she is tolerating amiodarone and low-dose carvedilol without any side effects.  I will reduce the dose of amiodarone to 100 mg daily.  I would like to see him back in 6 months for follow-up.  She needs labs, for therapeutic drug monitoring.  I will forward them to PCP as well.   Adrian Prows, MD, [facility] 07/28/2020, 4:19 PM Office: [phone] Pager: [phone]

## 2020-07-29 LAB — CBC
Hematocrit: 34 % (ref 34.0–46.6)
Hemoglobin: 11.5 g/dL (ref 11.1–15.9)
MCH: 31.8 pg (ref 26.6–33.0)
MCHC: 33.8 g/dL (ref 31.5–35.7)
MCV: 94 fL (ref 79–97)
Platelets: 200 10*3/uL (ref 150–450)
RBC: 3.62 x10E6/uL — ABNORMAL LOW (ref 3.77–5.28)
RDW: 13.4 % (ref 11.7–15.4)
WBC: 5.7 10*3/uL (ref 3.4–10.8)

## 2020-07-29 LAB — CMP14+EGFR
ALT: 16 IU/L (ref 0–32)
AST: 21 IU/L (ref 0–40)
Albumin/Globulin Ratio: 1.6 (ref 1.2–2.2)
Albumin: 4.1 g/dL (ref 3.5–4.6)
Alkaline Phosphatase: 108 IU/L (ref 44–121)
BUN/Creatinine Ratio: 18 (ref 12–28)
BUN: 25 mg/dL (ref 10–36)
Bilirubin Total: 0.5 mg/dL (ref 0.0–1.2)
CO2: 26 mmol/L (ref 20–29)
Calcium: 9.3 mg/dL (ref 8.7–10.3)
Chloride: 101 mmol/L (ref 96–106)
Creatinine, Ser: 1.41 mg/dL — ABNORMAL HIGH (ref 0.57–1.00)
GFR calc Af Amer: 37 mL/min/{1.73_m2} — ABNORMAL LOW (ref >59)
GFR calc non Af Amer: 32 mL/min/{1.73_m2} — ABNORMAL LOW (ref >59)
Globulin, Total: 2.5 g/dL (ref 1.5–4.5)
Glucose: 83 mg/dL (ref 65–99)
Potassium: 4.2 mmol/L (ref 3.5–5.2)
Sodium: 143 mmol/L (ref 134–144)
Total Protein: 6.6 g/dL (ref 6.0–8.5)

## 2020-07-29 LAB — TSH: TSH: 7.03 u[IU]/mL — ABNORMAL HIGH (ref 0.450–4.500)

## 2020-07-30 NOTE — Telephone Encounter (Signed)
Please update her med list. I changed her Amio to 100 mg daily so no need to change that

## 2020-07-30 NOTE — Progress Notes (Signed)
Stable CBC, BMP with stage 3 b chronic kidney disease. TSH (thyroid) minimally elevated. Forward to PCP also. Suspect TSH abnormality probably due to [facility]

## 2020-07-31 DIAGNOSIS — Z20828 Contact with and (suspected) exposure to other viral communicable diseases: Secondary | ICD-10-CM | POA: Diagnosis not present

## 2020-07-31 DIAGNOSIS — Z1159 Encounter for screening for other viral diseases: Secondary | ICD-10-CM | POA: Diagnosis not present

## 2020-08-04 NOTE — Telephone Encounter (Signed)
Updated in chart

## 2020-08-07 DIAGNOSIS — Z20828 Contact with and (suspected) exposure to other viral communicable diseases: Secondary | ICD-10-CM | POA: Diagnosis not present

## 2020-08-07 DIAGNOSIS — Z1159 Encounter for screening for other viral diseases: Secondary | ICD-10-CM | POA: Diagnosis not present

## 2020-08-14 DIAGNOSIS — Z20828 Contact with and (suspected) exposure to other viral communicable diseases: Secondary | ICD-10-CM | POA: Diagnosis not present

## 2020-08-14 DIAGNOSIS — Z1159 Encounter for screening for other viral diseases: Secondary | ICD-10-CM | POA: Diagnosis not present

## 2020-08-15 ENCOUNTER — Other Ambulatory Visit: Payer: Medicare Other

## 2020-08-15 ENCOUNTER — Other Ambulatory Visit: Payer: Self-pay

## 2020-08-15 DIAGNOSIS — Z515 Encounter for palliative care: Secondary | ICD-10-CM

## 2020-08-16 NOTE — Progress Notes (Signed)
COMMUNITY PALLIATIVE CARE SW NOTE  PATIENT NAME: Madison Marquez DOB: November 21, 1923 MRN: [id number]  PRIMARY CARE PROVIDER: Lajean Manes, MD  RESPONSIBLE PARTY:  Acct ID - Guarantor Home Phone Work Phone Relationship Acct Type  [id number] - [place],[phone]  Self P/F     [address], [place], [place] [address]     PLAN OF CARE and INTERVENTIONS:             1. GOALS OF CARE/ ADVANCE CARE PLANNING:  Goals is for patient to remain in her independent apartment. Patient is a DNR, form is in the apartment.  1.  1. SOCIAL/EMOTIONAL/SPIRITUAL ASSESSMENT/ INTERVENTIONS:  SW completed a with patient at the facility. She was sitting at her dinning room table reading her Bible. She stated that she slept late, and was just finishing up her breakfast. She denies pain, but report that she has regular pain to her left leg. Patient report that the pain causes her to be unable to put any weight on that leg or she is unable to turn over in bed. She stated that as long as she is still or sitting, she does not have any pain. She continues to slowly ambulate on her Rolator. She remains independent for ADL's, but has hired caregivers that come in for medication reminders and assistance with personal care. Patient went out to her granddaughters for Thanksgiving, but does not go outside of the apartment. However, she states that she may try to go to the beauty shop in the next couple of weeks. SW engaged patient in open dialogue. SW provided active listening, supportive presence, encouraged safety, provided reassurance of support and engaged patient in life review. SW will continue to assess patient psychosocial needs and provide support.   PATIENT/CAREGIVER EDUCATION/ COPING:  Patient is alert and oriented x3, but repeats herself. She has a supportive granddaughter who has daily contact with patient.  2.  3. PERSONAL EMERGENCY PLAN: Per facility protocol 1. COMMUNITY RESOURCES COORDINATION/ HEALTH CARE  NAVIGATION:  Patient has personal care assistance through [facility] at the facility 4.  5. FINANCIAL/LEGAL CONCERNS/INTERVENTIONS:  None     SOCIAL HX:  Social History   Tobacco Use  . Smoking status: Never Smoker  . Smokeless tobacco: Never Used  Substance Use Topics  . Alcohol use: No    CODE STATUS: DNR ADVANCED DIRECTIVES:No MOST FORM COMPLETE: No HOSPICE EDUCATION PROVIDED: No  PPS: Patient ambulates around herapartment with a walkerbut her gait is unsteady and she is having increased pain and weakness.She is alert and oriented x3, but isforgetful.She receivespersonal care assistancefordaily medication reminders and assistance with personal care needs.  Duration of visit and documentation:22minutes.      [address] [place], [place]

## 2020-08-21 DIAGNOSIS — Z1159 Encounter for screening for other viral diseases: Secondary | ICD-10-CM | POA: Diagnosis not present

## 2020-08-21 DIAGNOSIS — Z20828 Contact with and (suspected) exposure to other viral communicable diseases: Secondary | ICD-10-CM | POA: Diagnosis not present

## 2020-08-28 DIAGNOSIS — Z1159 Encounter for screening for other viral diseases: Secondary | ICD-10-CM | POA: Diagnosis not present

## 2020-08-28 DIAGNOSIS — Z20828 Contact with and (suspected) exposure to other viral communicable diseases: Secondary | ICD-10-CM | POA: Diagnosis not present

## 2020-09-04 DIAGNOSIS — Z1159 Encounter for screening for other viral diseases: Secondary | ICD-10-CM | POA: Diagnosis not present

## 2020-09-04 DIAGNOSIS — Z20828 Contact with and (suspected) exposure to other viral communicable diseases: Secondary | ICD-10-CM | POA: Diagnosis not present

## 2020-09-10 ENCOUNTER — Other Ambulatory Visit: Payer: Self-pay | Admitting: Cardiology

## 2020-09-10 DIAGNOSIS — I5041 Acute combined systolic (congestive) and diastolic (congestive) heart failure: Secondary | ICD-10-CM

## 2020-09-11 DIAGNOSIS — Z20828 Contact with and (suspected) exposure to other viral communicable diseases: Secondary | ICD-10-CM | POA: Diagnosis not present

## 2020-09-11 DIAGNOSIS — Z1159 Encounter for screening for other viral diseases: Secondary | ICD-10-CM | POA: Diagnosis not present

## 2020-09-13 DIAGNOSIS — D509 Iron deficiency anemia, unspecified: Secondary | ICD-10-CM | POA: Diagnosis not present

## 2020-09-13 DIAGNOSIS — G459 Transient cerebral ischemic attack, unspecified: Secondary | ICD-10-CM | POA: Diagnosis not present

## 2020-09-13 DIAGNOSIS — I480 Paroxysmal atrial fibrillation: Secondary | ICD-10-CM | POA: Diagnosis not present

## 2020-09-13 DIAGNOSIS — I5032 Chronic diastolic (congestive) heart failure: Secondary | ICD-10-CM | POA: Diagnosis not present

## 2020-09-13 DIAGNOSIS — K219 Gastro-esophageal reflux disease without esophagitis: Secondary | ICD-10-CM | POA: Diagnosis not present

## 2020-09-13 DIAGNOSIS — M810 Age-related osteoporosis without current pathological fracture: Secondary | ICD-10-CM | POA: Diagnosis not present

## 2020-09-13 DIAGNOSIS — E039 Hypothyroidism, unspecified: Secondary | ICD-10-CM | POA: Diagnosis not present

## 2020-09-13 DIAGNOSIS — I5022 Chronic systolic (congestive) heart failure: Secondary | ICD-10-CM | POA: Diagnosis not present

## 2020-09-18 DIAGNOSIS — Z1159 Encounter for screening for other viral diseases: Secondary | ICD-10-CM | POA: Diagnosis not present

## 2020-09-18 DIAGNOSIS — Z20828 Contact with and (suspected) exposure to other viral communicable diseases: Secondary | ICD-10-CM | POA: Diagnosis not present

## 2020-09-25 ENCOUNTER — Other Ambulatory Visit: Payer: Self-pay

## 2020-09-25 ENCOUNTER — Other Ambulatory Visit: Payer: Medicare Other

## 2020-09-25 DIAGNOSIS — Z1159 Encounter for screening for other viral diseases: Secondary | ICD-10-CM | POA: Diagnosis not present

## 2020-09-25 DIAGNOSIS — Z20828 Contact with and (suspected) exposure to other viral communicable diseases: Secondary | ICD-10-CM | POA: Diagnosis not present

## 2020-09-25 DIAGNOSIS — Z515 Encounter for palliative care: Secondary | ICD-10-CM

## 2020-09-25 NOTE — Progress Notes (Signed)
COMMUNITY PALLIATIVE CARE SW NOTE  PATIENT NAME: Madison Marquez DOB: 1924-07-06 MRN: [id number]  PRIMARY CARE PROVIDER: Lajean Manes, MD  RESPONSIBLE PARTY:  Acct ID - Guarantor Home Phone Work Phone Relationship Acct Type  [id number] - [place],[phone]  Self P/F     [address], [place], [place] [address]     PLAN OF CARE and INTERVENTIONS:             1. GOALS OF CARE/ ADVANCE CARE PLANNING:  Goals is for patient to remain in her independent apartment. Patient is a DNR, form is in the apartment. 1.  1. SOCIAL/EMOTIONAL/SPIRITUAL ASSESSMENT/ INTERVENTIONS:  SW completed a with patient at the facility. She was sitting at her dinning room table working on a crossword puzzle. Patient report ongoing pain to her left leg. She report that she has difficulty bearing weight on that leg. Patient expressed concern about falling. She states that she is trying to be careful, only getting up when she needed to. She utilizes a Corporate investment banker to ambulate. She stated that as long as she is still or sitting, she does not have any pain. She remains independent for ADL's, but has hired caregivers that come in for medication reminders and assistance with personal care. Patient continues to go to the hairdresser every other week.  Patient appeared to be in a good mood. She appears a little thinner, but healthy. SW engaged patient in open dialogue. SW provided active listening, supportive presence, encouraged safety, provided reassurance of support and engaged patient in life review.SW will continue to assess patient psychosocial needs and provide support. 2.  3. PATIENT/CAREGIVER EDUCATION/ COPING: Patient is alert and oriented x3, but repeats herself. She has a supportive granddaughter who has daily contact with patient.  4. PERSONAL EMERGENCY PLAN: Per facility staff. 1. COMMUNITY RESOURCES COORDINATION/ HEALTH CARE NAVIGATION:   Patient has personal care assistance through [facility] at the  facility 4.  5.  6. FINANCIAL/LEGAL CONCERNS/INTERVENTIONS:  None     SOCIAL HX:  Social History   Tobacco Use  . Smoking status: Never Smoker  . Smokeless tobacco: Never Used  Substance Use Topics  . Alcohol use: No    CODE STATUS: DNR ADVANCED DIRECTIVES: No MOST FORM COMPLETE:  No HOSPICE EDUCATION PROVIDED: No  PPS: Patient ambulates around herapartment with a walkerbut her gait is unsteadyand she is having increased pain and weakness.She is alert and oriented x3, but isforgetful.She receivespersonal care assistancefordaily medication reminders and assistance with personal care needs.  Duration of visit and documentation: 60 minutes     Katheren Puller, LCSW

## 2020-09-26 ENCOUNTER — Other Ambulatory Visit: Payer: Self-pay

## 2020-09-26 MED ORDER — CARVEDILOL 6.25 MG PO TABS
6.2500 mg | ORAL_TABLET | Freq: Two times a day (BID) | ORAL | 1 refills | Status: DC
Start: 2020-09-26 — End: 2020-10-17

## 2020-10-02 DIAGNOSIS — Z1159 Encounter for screening for other viral diseases: Secondary | ICD-10-CM | POA: Diagnosis not present

## 2020-10-02 DIAGNOSIS — Z20828 Contact with and (suspected) exposure to other viral communicable diseases: Secondary | ICD-10-CM | POA: Diagnosis not present

## 2020-10-09 DIAGNOSIS — Z1159 Encounter for screening for other viral diseases: Secondary | ICD-10-CM | POA: Diagnosis not present

## 2020-10-09 DIAGNOSIS — Z20828 Contact with and (suspected) exposure to other viral communicable diseases: Secondary | ICD-10-CM | POA: Diagnosis not present

## 2020-10-11 DIAGNOSIS — E039 Hypothyroidism, unspecified: Secondary | ICD-10-CM | POA: Diagnosis not present

## 2020-10-11 DIAGNOSIS — I480 Paroxysmal atrial fibrillation: Secondary | ICD-10-CM | POA: Diagnosis not present

## 2020-10-11 DIAGNOSIS — I5022 Chronic systolic (congestive) heart failure: Secondary | ICD-10-CM | POA: Diagnosis not present

## 2020-10-11 DIAGNOSIS — I5032 Chronic diastolic (congestive) heart failure: Secondary | ICD-10-CM | POA: Diagnosis not present

## 2020-10-11 DIAGNOSIS — I700 Atherosclerosis of aorta: Secondary | ICD-10-CM | POA: Diagnosis not present

## 2020-10-11 DIAGNOSIS — M1612 Unilateral primary osteoarthritis, left hip: Secondary | ICD-10-CM | POA: Diagnosis not present

## 2020-10-16 DIAGNOSIS — Z1159 Encounter for screening for other viral diseases: Secondary | ICD-10-CM | POA: Diagnosis not present

## 2020-10-16 DIAGNOSIS — Z20828 Contact with and (suspected) exposure to other viral communicable diseases: Secondary | ICD-10-CM | POA: Diagnosis not present

## 2020-10-17 ENCOUNTER — Other Ambulatory Visit: Payer: Self-pay

## 2020-10-17 ENCOUNTER — Other Ambulatory Visit: Payer: Medicare Other

## 2020-10-17 DIAGNOSIS — Z515 Encounter for palliative care: Secondary | ICD-10-CM

## 2020-10-17 MED ORDER — CARVEDILOL 6.25 MG PO TABS
6.2500 mg | ORAL_TABLET | Freq: Two times a day (BID) | ORAL | 1 refills | Status: DC
Start: 2020-10-17 — End: 2020-10-18

## 2020-10-18 ENCOUNTER — Other Ambulatory Visit: Payer: Self-pay

## 2020-10-18 MED ORDER — CARVEDILOL 6.25 MG PO TABS
6.2500 mg | ORAL_TABLET | Freq: Two times a day (BID) | ORAL | 1 refills | Status: DC
Start: 2020-10-18 — End: 2021-12-17

## 2020-10-20 NOTE — Progress Notes (Signed)
COMMUNITY PALLIATIVE CARE SW NOTE  PATIENT NAME: NETANYA YAZDANI DOB: 1924-08-02 MRN: [id number]  PRIMARY CARE PROVIDER: Lajean Manes, MD  RESPONSIBLE PARTY:  Acct ID - Guarantor Home Phone Work Phone Relationship Acct Type  [id number] - [place],[phone]  Self P/F     [address], [place], [place] [address]     PLAN OF CARE and INTERVENTIONS:             1. GOALS OF CARE/ ADVANCE CARE PLANNING:  Goals is for patient to remain in her independent apartment. Patient is a DNR, form is in the apartment 1.  1. SOCIAL/EMOTIONAL/SPIRITUAL ASSESSMENT/ INTERVENTIONS: SW completed a with patient at the facility. She was slowly getting up from the table to her rolator. Patient report ongoing pain to her left leg. She report that she has difficulty bearing weight on that leg. Patient expressed concern about falling, but is trying to be careful, only getting up when she needed to. As long as she is still or sitting, she does not experience any pain. She remains independent for ADL's, but has hired caregivers that come in for medication reminders and assistance with personal care. Patient appeared to be in a good mood as she engaged in life review. She appears a little thinner, but healthy.SW provided active listening, supportive presence, encouraged safety, provided reassurance of support and engaged patient in life review.SW will continue to assess patient psychosocial needs and provide support. 2.   2. PATIENT/CAREGIVER EDUCATION/ COPING: Patient is alert and oriented x3, but repeats herself. She has a supportive granddaughterwho has daily contact with patient. 3.   4. PERSONAL EMERGENCY PLAN: Per facility staff. 3. COMMUNITY RESOURCES COORDINATION/ HEALTH CARE NAVIGATION:  Patient has personal care assistance through [facility] at the facility  5. FINANCIAL/LEGAL CONCERNS/INTERVENTIONS:  None.     SOCIAL HX:  Social History   Tobacco Use  . Smoking status: Never Smoker  .  Smokeless tobacco: Never Used  Substance Use Topics  . Alcohol use: No    CODE STATUS: DNR ADVANCED DIRECTIVES: No MOST FORM COMPLETE:  No HOSPICE EDUCATION PROVIDED: No  PPS: Patient ambulates around herapartment with a Rolatorbut her gait is unsteadyand she is having increased pain and weakness to her legs.She is alert and oriented x3, but isforgetful.She receivespersonal care assistancefordaily medication reminders and assistance with personal care needs.  Duration of visit and documentation: 60 minutes       Katheren Puller, LCSW

## 2020-10-23 DIAGNOSIS — Z20828 Contact with and (suspected) exposure to other viral communicable diseases: Secondary | ICD-10-CM | POA: Diagnosis not present

## 2020-10-23 DIAGNOSIS — Z1159 Encounter for screening for other viral diseases: Secondary | ICD-10-CM | POA: Diagnosis not present

## 2020-10-30 DIAGNOSIS — Z1159 Encounter for screening for other viral diseases: Secondary | ICD-10-CM | POA: Diagnosis not present

## 2020-10-30 DIAGNOSIS — Z20828 Contact with and (suspected) exposure to other viral communicable diseases: Secondary | ICD-10-CM | POA: Diagnosis not present

## 2020-11-06 DIAGNOSIS — Z1159 Encounter for screening for other viral diseases: Secondary | ICD-10-CM | POA: Diagnosis not present

## 2020-11-06 DIAGNOSIS — Z20828 Contact with and (suspected) exposure to other viral communicable diseases: Secondary | ICD-10-CM | POA: Diagnosis not present

## 2020-11-13 DIAGNOSIS — Z20828 Contact with and (suspected) exposure to other viral communicable diseases: Secondary | ICD-10-CM | POA: Diagnosis not present

## 2020-11-13 DIAGNOSIS — Z1159 Encounter for screening for other viral diseases: Secondary | ICD-10-CM | POA: Diagnosis not present

## 2020-11-20 DIAGNOSIS — Z20828 Contact with and (suspected) exposure to other viral communicable diseases: Secondary | ICD-10-CM | POA: Diagnosis not present

## 2020-11-20 DIAGNOSIS — Z1159 Encounter for screening for other viral diseases: Secondary | ICD-10-CM | POA: Diagnosis not present

## 2020-11-21 ENCOUNTER — Other Ambulatory Visit: Payer: Medicare Other

## 2020-11-21 DIAGNOSIS — Z515 Encounter for palliative care: Secondary | ICD-10-CM

## 2020-11-22 NOTE — [facility] Notes (Signed)
COMMUNITY PALLIATIVE CARE SW NOTE  PATIENT NAME: Madison Marquez DOB: 1923/10/27 MRN: [id number]  PRIMARY CARE PROVIDER: Lajean Manes, MD  RESPONSIBLE PARTY:  Acct ID - Guarantor Home Phone Work Phone Relationship Acct Type  [id number] - [place],[phone]  Self P/F     [place], [place], [place] [address]     PLAN OF CARE and INTERVENTIONS:             1. GOALS OF CARE/ ADVANCE CARE PLANNING:  Goal is for patient to remain in her independent apartment. Patient is a DNR. 2. SOCIAL/EMOTIONAL/SPIRITUAL ASSESSMENT/ INTERVENTIONS:  SW completed a visit with patient at her independent apartment. Patient was sitting in her recliner, awake and alert. Patient reported that patient continues to ambulate with her Rolator. She continues to report weakness and intermittent pain to her left leg. Patient report that she continues to have difficulty bearing weight on that leg. Patient report that she continues to worry about falls, but is careful and keep her life alert button and cell phone near by. She is independent for ADL's, but receiving medication reminders and escorts through [facility]. Patient report less interest in completing puzzles or watching TV. She enjoys engaging in life review and talked about her husband and their relationship during this visit. She appears to be thinner. SW provided supportive presence, active listening, engaged patient in life review, reassurance of support.  3. PATIENT/CAREGIVER EDUCATION/ COPING:  Patient appears to be coping well. She has good family support.  4. PERSONAL EMERGENCY PLAN:  Per facility protocol. Patient has a life alert button. 5. COMMUNITY RESOURCES COORDINATION/ HEALTH CARE NAVIGATION: Patient receives personal care assistance through [facility] at the facility.  6. FINANCIAL/LEGAL CONCERNS/INTERVENTIONS:  None.     SOCIAL HX:  Social History   Tobacco Use  . Smoking status: Never Smoker  . Smokeless tobacco: Never Used   Substance Use Topics  . Alcohol use: No    CODE STATUS: DNR ADVANCED DIRECTIVES: No MOST FORM COMPLETE:  No HOSPICE EDUCATION PROVIDED: No  PPS: Patient ambulates around herapartment with a Rolatorbut her gait is unsteadyand she is having increased pain and weakness to her legs.She is alert and oriented x3, but isforgetful.She receivespersonal care assistancefordaily medication reminders and standby assistance with personal care needs.  Duration of visit and documentation: 60 minutes      Katheren Puller, LCSW

## 2020-11-27 DIAGNOSIS — Z1159 Encounter for screening for other viral diseases: Secondary | ICD-10-CM | POA: Diagnosis not present

## 2020-11-27 DIAGNOSIS — Z20828 Contact with and (suspected) exposure to other viral communicable diseases: Secondary | ICD-10-CM | POA: Diagnosis not present

## 2020-12-04 DIAGNOSIS — Z20828 Contact with and (suspected) exposure to other viral communicable diseases: Secondary | ICD-10-CM | POA: Diagnosis not present

## 2020-12-04 DIAGNOSIS — Z1159 Encounter for screening for other viral diseases: Secondary | ICD-10-CM | POA: Diagnosis not present

## 2020-12-05 DIAGNOSIS — G459 Transient cerebral ischemic attack, unspecified: Secondary | ICD-10-CM | POA: Diagnosis not present

## 2020-12-05 DIAGNOSIS — I480 Paroxysmal atrial fibrillation: Secondary | ICD-10-CM | POA: Diagnosis not present

## 2020-12-05 DIAGNOSIS — I5022 Chronic systolic (congestive) heart failure: Secondary | ICD-10-CM | POA: Diagnosis not present

## 2020-12-05 DIAGNOSIS — M810 Age-related osteoporosis without current pathological fracture: Secondary | ICD-10-CM | POA: Diagnosis not present

## 2020-12-05 DIAGNOSIS — D509 Iron deficiency anemia, unspecified: Secondary | ICD-10-CM | POA: Diagnosis not present

## 2020-12-05 DIAGNOSIS — M1612 Unilateral primary osteoarthritis, left hip: Secondary | ICD-10-CM | POA: Diagnosis not present

## 2020-12-05 DIAGNOSIS — E039 Hypothyroidism, unspecified: Secondary | ICD-10-CM | POA: Diagnosis not present

## 2020-12-05 DIAGNOSIS — I5032 Chronic diastolic (congestive) heart failure: Secondary | ICD-10-CM | POA: Diagnosis not present

## 2020-12-05 DIAGNOSIS — K219 Gastro-esophageal reflux disease without esophagitis: Secondary | ICD-10-CM | POA: Diagnosis not present

## 2020-12-11 DIAGNOSIS — Z1159 Encounter for screening for other viral diseases: Secondary | ICD-10-CM | POA: Diagnosis not present

## 2020-12-11 DIAGNOSIS — Z20828 Contact with and (suspected) exposure to other viral communicable diseases: Secondary | ICD-10-CM | POA: Diagnosis not present

## 2020-12-18 ENCOUNTER — Other Ambulatory Visit: Payer: Self-pay

## 2020-12-18 ENCOUNTER — Other Ambulatory Visit: Payer: Medicare Other

## 2020-12-18 DIAGNOSIS — Z20828 Contact with and (suspected) exposure to other viral communicable diseases: Secondary | ICD-10-CM | POA: Diagnosis not present

## 2020-12-18 DIAGNOSIS — Z515 Encounter for palliative care: Secondary | ICD-10-CM

## 2020-12-18 DIAGNOSIS — Z1159 Encounter for screening for other viral diseases: Secondary | ICD-10-CM | POA: Diagnosis not present

## 2020-12-21 NOTE — [facility] Notes (Signed)
COMMUNITY PALLIATIVE CARE SW NOTE  PATIENT NAME: Madison Marquez DOB: May 26, 1924 MRN: [id number]  PRIMARY CARE PROVIDER: Lajean Manes, MD  RESPONSIBLE PARTY:  Acct ID - Guarantor Home Phone Work Phone Relationship Acct Type  [id number] - [place],[phone]  Self P/F     [place], [place], [place] [address]     PLAN OF CARE and INTERVENTIONS:             1. GOALS OF CARE/ ADVANCE CARE PLANNING:  Goal is for patient to remain in her apartment. Patient is a DNR. 2. SOCIAL/EMOTIONAL/SPIRITUAL ASSESSMENT/ INTERVENTIONS:  SW completed a face-to-face visit with patient at her apartment. Patient was sitting in her recliner, reading her bible. She greeted SW warmly and invited SW in to talk. She denied pain, but report that she continues to have weakness and pain to her left leg. She states that as long as she is sitting and not moving, she does not have any discomfort. Patient continues to ambulate with her Rolator, slowly, but without any problems. She has a strong safety awareness where she is cautious to not fall. She has a safety alert button and personal care assistance through [facility]. She is independent for most personal care needs. She continues to read, work on puzzles and watch TV to pass the time. SW encouraged patient to consider some future visits with palliative care SW to take place out on the porch-patient to think about it. SW provided encouragement, reassurance of support, active listening, assessment of her needs and comfort, supportive presence and counseling.  3. PATIENT/CAREGIVER EDUCATION/ COPING: Patient appears to be coping well. She has good family support and daily contact with her family via phone calls or visits.  4. PERSONAL EMERGENCY PLAN:  Per facility protocol. Patient has life alert button and daily checks from [facility].  5. COMMUNITY RESOURCES COORDINATION/ HEALTH CARE NAVIGATION:  [facility] personal care assistance. 6. FINANCIAL/LEGAL  CONCERNS/INTERVENTIONS:  None.     SOCIAL HX:  Social History   Tobacco Use  . Smoking status: Never Smoker  . Smokeless tobacco: Never Used  Substance Use Topics  . Alcohol use: No    CODE STATUS: DNR ADVANCED DIRECTIVES: No MOST FORM COMPLETE:  No HOSPICE EDUCATION PROVIDED: No  PPS: Patient ambulates around herapartment with aRolatorbut her gait is unsteadyand she is having increased pain and weaknessto her legs.She is alert and oriented x3, but isforgetful.She receivespersonal care assistancefordaily checks and medication reminders.  Duration of visit and documentation: 60 minutes      Katheren Puller, LCSW

## 2020-12-25 DIAGNOSIS — Z1159 Encounter for screening for other viral diseases: Secondary | ICD-10-CM | POA: Diagnosis not present

## 2020-12-25 DIAGNOSIS — Z20828 Contact with and (suspected) exposure to other viral communicable diseases: Secondary | ICD-10-CM | POA: Diagnosis not present

## 2021-01-01 DIAGNOSIS — Z1159 Encounter for screening for other viral diseases: Secondary | ICD-10-CM | POA: Diagnosis not present

## 2021-01-01 DIAGNOSIS — Z20828 Contact with and (suspected) exposure to other viral communicable diseases: Secondary | ICD-10-CM | POA: Diagnosis not present

## 2021-01-08 DIAGNOSIS — Z1159 Encounter for screening for other viral diseases: Secondary | ICD-10-CM | POA: Diagnosis not present

## 2021-01-08 DIAGNOSIS — Z20828 Contact with and (suspected) exposure to other viral communicable diseases: Secondary | ICD-10-CM | POA: Diagnosis not present

## 2021-01-15 DIAGNOSIS — Z1159 Encounter for screening for other viral diseases: Secondary | ICD-10-CM | POA: Diagnosis not present

## 2021-01-15 DIAGNOSIS — Z20828 Contact with and (suspected) exposure to other viral communicable diseases: Secondary | ICD-10-CM | POA: Diagnosis not present

## 2021-01-22 DIAGNOSIS — Z20828 Contact with and (suspected) exposure to other viral communicable diseases: Secondary | ICD-10-CM | POA: Diagnosis not present

## 2021-01-22 DIAGNOSIS — Z1159 Encounter for screening for other viral diseases: Secondary | ICD-10-CM | POA: Diagnosis not present

## 2021-01-23 ENCOUNTER — Other Ambulatory Visit: Payer: Self-pay

## 2021-01-23 ENCOUNTER — Other Ambulatory Visit: Payer: Medicare Other

## 2021-01-23 DIAGNOSIS — Z515 Encounter for palliative care: Secondary | ICD-10-CM

## 2021-01-23 NOTE — [facility] Notes (Signed)
COMMUNITY PALLIATIVE CARE SW NOTE  PATIENT NAME: Madison Marquez DOB: [birth year] MRN: [id number]  PRIMARY CARE PROVIDER: Lajean Manes, MD  RESPONSIBLE PARTY:  Acct ID - Guarantor Home Phone Work Phone Relationship Acct Type  [id number] - [place],[phone]  Self P/F     [place], [address], [place] [address]     PLAN OF CARE and INTERVENTIONS:             1. GOALS OF CARE/ ADVANCE CARE PLANNING:  Goal is for patient to remain in her independent apartment. Patient is a DNR. 2. SOCIAL/EMOTIONAL/SPIRITUAL ASSESSMENT/ INTERVENTIONS:  SW completed a a visit with patient at her apartment. Patient was up, putting away her dishes and then used the bathroom. Patient report that her left leg has been "giving her a fit". She report pain in that left leg where she has difficulty walking and at times, bare weight. She took a Tylenol and reclined herself in her chair. Patient appeared to be in good spirits as she engaged in life review. She expressed that she does get lonely, but is grateful for her neighbor who checks in on her two times a day to ensure that she is okay. SW encouraged patient to use her Rolator to sit when she feels weak. SW also encouraged her to rest and not to try to do too much and to keep her life alert. SW encouraged her to also consider seeing her physician. SW provided supportive presence, active listening , engaged patient in the life review and reassurance of ongoing support. 3. PATIENT/CAREGIVER EDUCATION/ COPING:  Patient appears to be coping well and has good family support.  4. PERSONAL EMERGENCY PLAN:  Per facility protocol. Patient has a life alert button. 5. COMMUNITY RESOURCES COORDINATION/ HEALTH CARE NAVIGATION:  Patient receives personal care assistance through [facility]. 6. FINANCIAL/LEGAL CONCERNS/INTERVENTIONS:  None.     SOCIAL HX:  Social History   Tobacco Use  . Smoking status: Never Smoker  . Smokeless tobacco: Never Used   Substance Use Topics  . Alcohol use: No    CODE STATUS: DNR ADVANCED DIRECTIVES: No MOST FORM COMPLETE: No HOSPICE EDUCATION PROVIDED: No  PPS: Patient ambulates around herapartment with aRolatorbut her gait is unsteadyand she is having increased pain and weaknessto her legs.She is alert and oriented x3, but isforgetful.She receivespersonal care assistancefordaily medication reminders and standby assistance with personal care needs.  Duration of visit and documentation: 60 minutes      Katheren Puller, LCSW

## 2021-01-26 ENCOUNTER — Other Ambulatory Visit: Payer: Self-pay

## 2021-01-26 ENCOUNTER — Encounter: Payer: Self-pay | Admitting: Cardiology

## 2021-01-26 ENCOUNTER — Ambulatory Visit: Payer: Medicare Other | Admitting: Cardiology

## 2021-01-26 VITALS — BP 114/66 | HR 56 | Temp 97.7°F | Resp 17 | Ht 64.0 in | Wt 150.0 lb

## 2021-01-26 DIAGNOSIS — I5042 Chronic combined systolic (congestive) and diastolic (congestive) heart failure: Secondary | ICD-10-CM | POA: Diagnosis not present

## 2021-01-26 DIAGNOSIS — I480 Paroxysmal atrial fibrillation: Secondary | ICD-10-CM

## 2021-01-26 DIAGNOSIS — G4734 Idiopathic sleep related nonobstructive alveolar hypoventilation: Secondary | ICD-10-CM | POA: Diagnosis not present

## 2021-01-26 DIAGNOSIS — N1831 Chronic kidney disease, stage 3a: Secondary | ICD-10-CM | POA: Diagnosis not present

## 2021-01-26 DIAGNOSIS — Z66 Do not resuscitate: Secondary | ICD-10-CM | POA: Diagnosis not present

## 2021-01-26 NOTE — Telephone Encounter (Signed)
Dr. Ganji's pt

## 2021-01-26 NOTE — Telephone Encounter (Signed)
From pt

## 2021-01-26 NOTE — [facility] Notes (Signed)
Primary Physician/Referring:  Lajean Manes, MD  Patient ID: Madison Marquez, female    DOB: [birth year], 87 y.o.   MRN: [id number]  Chief Complaint  Patient presents with  . Congestive Heart Failure  . Atrial Fibrillation    6 MONTH   HPI:    Madison Marquez  is a 87 y.o. female  with chronic systolic and diastolic heart failure, CKD stage 3, hypertension, hypothyroidism, and paroxysmal atrial flutter and atrial fibrillation and is on chronic Amiodarone and maintains sinus rhythm, has underlying sinus bradycardia and low blood pressure hence not been able to titrate her beta-blockers up.   She now presents for a 6 month office visit for follow-up of congestive heart failure.  On the present medical therapy including home oxygen, she has noticed marked improvement in overall wellbeing and also has not had any further PND or orthopnea or leg edema.  Today she has no specific complaints.  Past Medical History:  Diagnosis Date  . Acute CHF (congestive heart failure) ([facility]) 08/25/2017  . Arthritis    "probably in my right knee" (08/25/2017)  . [facility] Patient infection [phone]  . Hypercholesterolemia   . Hypothyroid   . Migraine    "I've had 1 in my lifetime" (08/25/2017)   Past Surgical History:  Procedure Laterality Date  . ABDOMINAL HYSTERECTOMY    . ANKLE FRACTURE SURGERY Right   . BLADDER SUSPENSION    . BREAST LUMPECTOMY Left   . BREAST SURGERY Left    "leaky nipple"  . CARDIOVERSION N/A 04/14/2018   Procedure: CARDIOVERSION;  Surgeon: Adrian Prows, MD;  Location: [facility] ENDOSCOPY;  Service: Cardiovascular;  Laterality: N/A;  . CARDIOVERSION N/A 05/05/2018   Procedure: CARDIOVERSION;  Surgeon: Nigel Mormon, MD;  Location: [facility] ENDOSCOPY;  Service: Cardiovascular;  Laterality: N/A;  . CARPAL TUNNEL RELEASE Left 10/15/2016   Procedure: LEFT CARPAL TUNNEL RELEASE;  Surgeon: Daryll Brod, MD;  Location: [facility];  Service: Orthopedics;  Laterality: Left;  . CATARACT  EXTRACTION W/ INTRAOCULAR LENS  IMPLANT, BILATERAL Bilateral 2017  . FRACTURE SURGERY    . HERNIA REPAIR    . LEFT HEART CATH AND CORONARY ANGIOGRAPHY N/A 08/29/2017   Procedure: LEFT HEART CATH AND CORONARY ANGIOGRAPHY;  Surgeon: Adrian Prows, MD;  Location: [facility];  Service: Cardiovascular;  Laterality: N/A;  . UMBILICAL HERNIA REPAIR     Family History  Problem Relation Age of Onset  . Heart failure Brother   . Diabetes Brother   . Cancer Sister     Social History   Tobacco Use  . Smoking status: Never Smoker  . Smokeless tobacco: Never Used  Substance Use Topics  . Alcohol use: No   Marital Status: Widowed  ROS  Review of Systems  Constitutional: Negative for malaise/fatigue.  Cardiovascular: Positive for dyspnea on exertion (chronic, stable). Negative for chest pain and leg swelling.  Musculoskeletal: Positive for joint pain.  Gastrointestinal: Negative for melena.   Objective  Blood pressure 114/66, pulse (!) 56, temperature 97.7 F (36.5 C), temperature source Temporal, resp. rate 17, height _0  (1.626 m), weight 150 lb (68 kg), SpO2 98 %.  Vitals with BMI 01/26/2021 07/28/2020 01/27/2020  Height _1  _2  _3   Weight 150 lbs 149 lbs 142 lbs  BMI 25.73 88.91 69.45  Systolic 038 882 800  Diastolic 66 55 53  Pulse 56 60 61     Physical Exam Constitutional:      Comments: petite  Cardiovascular:  Rate and Rhythm: Normal rate and regular rhythm.     Pulses: Intact distal pulses.          Carotid pulses are 2+ on the right side and 2+ on the left side.    Heart sounds: Murmur heard.   Midsystolic murmur is present with a grade of 2/6 at the apex. No gallop.      Comments: No leg edema. JVD absent Pulmonary:     Effort: Pulmonary effort is normal. No accessory muscle usage or respiratory distress.     Breath sounds: Normal breath sounds.  Abdominal:     General: Bowel sounds are normal.     Palpations: Abdomen is soft.    Laboratory examination:    Recent Labs    07/28/20 1454  NA 143  K 4.2  CL 101  CO2 26  GLUCOSE 83  BUN 25  CREATININE 1.41*  CALCIUM 9.3  GFRNONAA 32*  GFRAA 37*   CrCl cannot be calculated (Patient's most recent lab result is older than the maximum 21 days allowed.).  CMP Latest Ref Rng & Units 07/28/2020 08/05/2019 03/25/2019  Glucose 65 - 99 mg/dL 83 83 54(L)  BUN 10 - 36 mg/dL _0 Creatinine 0.57 - 1.00 mg/dL 1.41(H) 1.56(H) 1.14(H)  Sodium 134 - 144 mmol/L 143 141 144  Potassium 3.5 - 5.2 mmol/L 4.2 3.9 4.7  Chloride 96 - 106 mmol/L 101 97 106  CO2 20 - 29 mmol/L 26 30(H) 24  Calcium 8.7 - 10.3 mg/dL 9.3 9.4 9.4  Total Protein 6.0 - 8.5 g/dL 6.6 - -  Total Bilirubin 0.0 - 1.2 mg/dL 0.5 - -  Alkaline Phos 44 - 121 IU/L 108 - -  AST 0 - 40 IU/L 21 - -  ALT 0 - 32 IU/L 16 - -   CBC Latest Ref Rng & Units 07/28/2020 08/11/2019 08/05/2019  WBC 3.4 - 10.8 x10E3/uL 5.7 - 6.2  Hemoglobin 11.1 - 15.9 g/dL 11.5 10.7(L) 10.6(L)  Hematocrit 34.0 - 46.6 % 34.0 32.8(L) 32.0(L)  Platelets 150 - 450 x10E3/uL 200 - 250      Component Value Date/Time   CHOL 169 08/25/2017 1303   TRIG 51 08/25/2017 1303   HDL 61 08/25/2017 1303   CHOLHDL 2.8 08/25/2017 1303   VLDL 10 08/25/2017 1303   LDLCALC 98 08/25/2017 1303   TSH Recent Labs    07/28/20 1454  TSH 7.030*     External labs:    Hemoglobin 11.500 G/ 07/28/2020 Platelets 200.000 x1 07/28/2020  Creatinine, Serum 1.410 mg/ 07/28/2020 Potassium 4.200 mm 07/28/2020 ALT (SGPT) 16.000 IU/ 07/28/2020  TSH 7.030 07/28/2020  Creatinine, Serum 1.360 mg/ 02/09/2020 Potassium 4.000 mm 02/09/2020   07/13/2019: Creatinine 1.39, EGFR 35, potassium 4.1, BMP otherwise normal.  BNP 394.  RBC 3.3, hemoglobin 10.4, hematocrit 31.9, RDW 16.4%, CBC otherwise normal.  Medications and allergies  No Known Allergies   Current Outpatient Medications on File Prior to Visit  Medication Sig Dispense Refill  . amiodarone (PACERONE) 200 MG tablet Take 2 mg by mouth  daily. Reduced to half a tablet    . carvedilol (COREG) 6.25 MG tablet Take 1 tablet (6.25 mg total) by mouth 2 (two) times daily with a meal. Taking one half tab bid 180 tablet 1  . Cholecalciferol (VITAMIN D) 2000 units tablet Take 2,000 Units by mouth daily.     . Cyanocobalamin (VITAMIN B-12) 5000 MCG TBDP Take 5,000 mcg by mouth daily.     . Ferrous  Sulfate (IRON) 325 (65 Fe) MG TABS Take 1 tablet by mouth 3 (three) times a week.    . levothyroxine (SYNTHROID, LEVOTHROID) 75 MCG tablet Take 75 mcg by mouth daily before breakfast.     . oxyCODONE-acetaminophen (PERCOCET/ROXICET) 5-325 MG tablet Take 1 tablet by mouth every 6 (six) hours as needed for severe pain. 15 tablet 0  . torsemide (DEMADEX) 20 MG tablet Take 20 mg by mouth daily.     No current facility-administered medications on file prior to visit.    Radiology:   No results found.  Cardiac Studies:   Coronary angiogram 08/29/2017: Normal coronary arteries. Normal LV systolic function.  Direct current cardioversion 04/14/2018: A. Fibrillation: 120x1, 150x2, 150x3 to NSR.  Echocardiogram 03/05/2019: Left ventricle cavity is normal in size. Moderate concentric hypertrophy of the left ventricle. Moderate global hypokinesis.  Moderately depressed LV systolic function with visual EF 35-40%. Diastolic function not assessed due to possible atrial fibrillation. Calculated EF 35%. Left atrial cavity is mildly dilated. Trileaflet aortic valve. Mild aortic valve leaflet calcification. Moderate (Grade II) aortic regurgitation. Mild to moderate mitral regurgitation. Mild tricuspid regurgitation. Estimated pulmonary artery systolic pressure is 29 mmHg. Mild pulmonic regurgitation. No significant change compared to previous study on 08/26/2017.  Nocturnal oximetry 08/05/2019: SpO2 less than 88% 21 minutes, less than 89% 37 minutes, oxygen desaturation events 457, index 44.  Patient qualifies for oxygen supplementation by Medicare  guidelines for group 1.    EKG  EKG 01/26/2021: Most probable sinus rhythm with first-degree AV block at rate of 56 bpm, left atrial enlargement, left bundle branch block.  No further analysis.  No significant change from 07/28/2020.  03/11/2019: Atrial flutter with RVR at 138 bpm, left axis deviation, cannot exclude inferior infarct old. Poor R wave progression cannot exclude anterolateral infarct old. Compared to EKG 05/14/2018, A flutter is new.   Assessment     ICD-10-CM   1. Paroxysmal atrial fibrillation (HCC)  I48.0 EKG 12-Lead  2. Chronic combined systolic and diastolic heart failure (HCC)  I50.42   3. Stage 3a chronic kidney disease (HCC)  N18.31   4. DNR (do not resuscitate)  Z66   5. Nocturnal hypoxemia  G47.34     No orders of the defined types were placed in this encounter.   There are no discontinued medications.  Recommendations:   DONNAMARIE SHANKLES  is a 87 y.o. female  with chronic systolic and diastolic heart failure, CKD stage 3, hypertension, hypothyroidism, and paroxysmal atrial flutter and atrial fibrillation. Has symptomatic AF and has had cardioversion in past and is on chronic Amiodarone, and presently maintains sinus rhythm/ectopic atrial rhythm. Has underlying sinus bradycardia and low blood pressure hence not been able to titrate her beta-blockers up.   She is presently on conservative therapy and DNR.  She has done remarkably well and has remained out of the hospital and out of emergency rooms.  Today she is not in any acute decompensated heart failure.  She has maintained sinus rhythm and presently on very minimal dose of amiodarone at 100 mg daily, external labs reviewed, TSH mildly abnormal. She is well compensated for therapeutic monitoring and hopefully will help [place] make future decisions regarding end-of-life issues as well.  Otherwise she is on appropriate medical therapy, no changes were done today.  She is off of anticoagulation due to risk of fall.  I  will see her back in 6 months or sooner if problems.      Adrian Prows, MD, [facility] 01/26/2021,  2:21 PM Office: [phone] Pager: [phone]

## 2021-01-26 NOTE — Telephone Encounter (Signed)
From patient. I have updated the medication list.

## 2021-01-29 DIAGNOSIS — Z1159 Encounter for screening for other viral diseases: Secondary | ICD-10-CM | POA: Diagnosis not present

## 2021-01-29 DIAGNOSIS — Z20828 Contact with and (suspected) exposure to other viral communicable diseases: Secondary | ICD-10-CM | POA: Diagnosis not present

## 2021-02-05 DIAGNOSIS — Z1159 Encounter for screening for other viral diseases: Secondary | ICD-10-CM | POA: Diagnosis not present

## 2021-02-05 DIAGNOSIS — Z20828 Contact with and (suspected) exposure to other viral communicable diseases: Secondary | ICD-10-CM | POA: Diagnosis not present

## 2021-02-12 ENCOUNTER — Other Ambulatory Visit: Payer: Self-pay | Admitting: Cardiology

## 2021-02-12 DIAGNOSIS — Z1159 Encounter for screening for other viral diseases: Secondary | ICD-10-CM | POA: Diagnosis not present

## 2021-02-12 DIAGNOSIS — Z20828 Contact with and (suspected) exposure to other viral communicable diseases: Secondary | ICD-10-CM | POA: Diagnosis not present

## 2021-02-19 DIAGNOSIS — Z20828 Contact with and (suspected) exposure to other viral communicable diseases: Secondary | ICD-10-CM | POA: Diagnosis not present

## 2021-02-19 DIAGNOSIS — Z1159 Encounter for screening for other viral diseases: Secondary | ICD-10-CM | POA: Diagnosis not present

## 2021-02-26 DIAGNOSIS — Z1159 Encounter for screening for other viral diseases: Secondary | ICD-10-CM | POA: Diagnosis not present

## 2021-02-26 DIAGNOSIS — Z20828 Contact with and (suspected) exposure to other viral communicable diseases: Secondary | ICD-10-CM | POA: Diagnosis not present

## 2021-02-28 ENCOUNTER — Other Ambulatory Visit: Payer: Self-pay

## 2021-02-28 ENCOUNTER — Other Ambulatory Visit: Payer: Medicare Other

## 2021-02-28 DIAGNOSIS — Z515 Encounter for palliative care: Secondary | ICD-10-CM

## 2021-03-05 DIAGNOSIS — Z20828 Contact with and (suspected) exposure to other viral communicable diseases: Secondary | ICD-10-CM | POA: Diagnosis not present

## 2021-03-05 DIAGNOSIS — Z1159 Encounter for screening for other viral diseases: Secondary | ICD-10-CM | POA: Diagnosis not present

## 2021-03-12 DIAGNOSIS — Z1159 Encounter for screening for other viral diseases: Secondary | ICD-10-CM | POA: Diagnosis not present

## 2021-03-12 DIAGNOSIS — Z20828 Contact with and (suspected) exposure to other viral communicable diseases: Secondary | ICD-10-CM | POA: Diagnosis not present

## 2021-03-19 DIAGNOSIS — Z20828 Contact with and (suspected) exposure to other viral communicable diseases: Secondary | ICD-10-CM | POA: Diagnosis not present

## 2021-03-19 DIAGNOSIS — Z1159 Encounter for screening for other viral diseases: Secondary | ICD-10-CM | POA: Diagnosis not present

## 2021-03-23 NOTE — [facility] Notes (Signed)
COMMUNITY PALLIATIVE CARE SW NOTE  PATIENT NAME: Madison Marquez DOB: Nov 18, 1923 MRN: [id number]  PRIMARY CARE PROVIDER: Lajean Manes, MD  RESPONSIBLE PARTY:  Acct ID - Guarantor Home Phone Work Phone Relationship Acct Type  [id number] - [place],[phone]  Self P/F     [place], [place], [place] [address]     PLAN OF CARE and INTERVENTIONS:             GOALS OF CARE/ ADVANCE CARE PLANNING:  Goal is for patient to remain in her apartment. Patient is a DNR.  SOCIAL/EMOTIONAL/SPIRITUAL ASSESSMENT/ INTERVENTIONS: SW completed a visit with patient at her apartment. She was sitting in her recliner. Patient was alert and oriented x3, cordial and engaged. Patient denied pain, but report that she continues to have intermittent pain to her left leg. She states that it causes pain where it is difficult to walk. She continues to take Tylenol, which offers her relief. She had her feet reclined and SW encouraged her to continue to do this. 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 Patient continues to go down to the beauty parlor to get her hair done weekly. She has meals in her room. SW encouraged her to continue to keep her feet elevated and use her Rolator to sit and rest when she is tired or feel week. SW provided assessment of patient's needs and comfort, observation, supportive presence, active listening, while engaging patient in life review and reassurance of support.  PATIENT/CAREGIVER EDUCATION/ COPING:  Patient appears to be coping well and has a supportive family. PERSONAL EMERGENCY PLAN:  Per facility protocol. Patient has a life alert button that she wears around her neck.  COMMUNITY RESOURCES COORDINATION/ HEALTH CARE NAVIGATION:  Patient has personal care assistance through [facility].  FINANCIAL/LEGAL CONCERNS/INTERVENTIONS:  None.      SOCIAL HX:  Social History   Tobacco Use   Smoking status: Never    Smokeless tobacco: Never  Substance Use Topics   Alcohol use: No    CODE STATUS: DNR ADVANCED DIRECTIVES: No MOST FORM COMPLETE: No HOSPICE EDUCATION PROVIDED: NO  PPS: Patient ambulates around her apartment with a Rolator but her gait is unsteady and she is having increased pain and weakness to her leg. She is alert and oriented x3, but is forgetful. She receives personal care assistance for daily  medication reminders and standby assistance with personal care needs.   Duration of visit and documentation: 60 minutes  Katheren Puller, LCSW

## 2021-03-26 DIAGNOSIS — Z20828 Contact with and (suspected) exposure to other viral communicable diseases: Secondary | ICD-10-CM | POA: Diagnosis not present

## 2021-03-26 DIAGNOSIS — Z1159 Encounter for screening for other viral diseases: Secondary | ICD-10-CM | POA: Diagnosis not present

## 2021-04-02 DIAGNOSIS — Z8616 Personal history of COVID-19: Secondary | ICD-10-CM | POA: Diagnosis not present

## 2021-04-09 DIAGNOSIS — Z20828 Contact with and (suspected) exposure to other viral communicable diseases: Secondary | ICD-10-CM | POA: Diagnosis not present

## 2021-04-10 DIAGNOSIS — M1612 Unilateral primary osteoarthritis, left hip: Secondary | ICD-10-CM | POA: Diagnosis not present

## 2021-04-10 DIAGNOSIS — M810 Age-related osteoporosis without current pathological fracture: Secondary | ICD-10-CM | POA: Diagnosis not present

## 2021-04-10 DIAGNOSIS — I480 Paroxysmal atrial fibrillation: Secondary | ICD-10-CM | POA: Diagnosis not present

## 2021-04-10 DIAGNOSIS — D509 Iron deficiency anemia, unspecified: Secondary | ICD-10-CM | POA: Diagnosis not present

## 2021-04-10 DIAGNOSIS — I5032 Chronic diastolic (congestive) heart failure: Secondary | ICD-10-CM | POA: Diagnosis not present

## 2021-04-10 DIAGNOSIS — I5022 Chronic systolic (congestive) heart failure: Secondary | ICD-10-CM | POA: Diagnosis not present

## 2021-04-10 DIAGNOSIS — G459 Transient cerebral ischemic attack, unspecified: Secondary | ICD-10-CM | POA: Diagnosis not present

## 2021-04-10 DIAGNOSIS — K219 Gastro-esophageal reflux disease without esophagitis: Secondary | ICD-10-CM | POA: Diagnosis not present

## 2021-04-10 DIAGNOSIS — E039 Hypothyroidism, unspecified: Secondary | ICD-10-CM | POA: Diagnosis not present

## 2021-04-16 DIAGNOSIS — Z1389 Encounter for screening for other disorder: Secondary | ICD-10-CM | POA: Diagnosis not present

## 2021-04-16 DIAGNOSIS — Z0000 Encounter for general adult medical examination without abnormal findings: Secondary | ICD-10-CM | POA: Diagnosis not present

## 2021-04-16 DIAGNOSIS — Z79899 Other long term (current) drug therapy: Secondary | ICD-10-CM | POA: Diagnosis not present

## 2021-04-16 DIAGNOSIS — Z7189 Other specified counseling: Secondary | ICD-10-CM | POA: Diagnosis not present

## 2021-04-16 DIAGNOSIS — I480 Paroxysmal atrial fibrillation: Secondary | ICD-10-CM | POA: Diagnosis not present

## 2021-04-16 DIAGNOSIS — I5022 Chronic systolic (congestive) heart failure: Secondary | ICD-10-CM | POA: Diagnosis not present

## 2021-04-16 DIAGNOSIS — E039 Hypothyroidism, unspecified: Secondary | ICD-10-CM | POA: Diagnosis not present

## 2021-04-23 DIAGNOSIS — Z20828 Contact with and (suspected) exposure to other viral communicable diseases: Secondary | ICD-10-CM | POA: Diagnosis not present

## 2021-04-30 DIAGNOSIS — Z20828 Contact with and (suspected) exposure to other viral communicable diseases: Secondary | ICD-10-CM | POA: Diagnosis not present

## 2021-05-07 ENCOUNTER — Other Ambulatory Visit: Payer: Medicare Other

## 2021-05-07 ENCOUNTER — Other Ambulatory Visit: Payer: Self-pay

## 2021-05-07 DIAGNOSIS — Z515 Encounter for palliative care: Secondary | ICD-10-CM

## 2021-05-07 DIAGNOSIS — Z20828 Contact with and (suspected) exposure to other viral communicable diseases: Secondary | ICD-10-CM | POA: Diagnosis not present

## 2021-05-07 NOTE — [facility] Notes (Signed)
COMMUNITY PALLIATIVE CARE SW NOTE  PATIENT NAME: Madison Marquez DOB: 31-Mar-1924 MRN: [id number]  PRIMARY CARE PROVIDER: Lajean Manes, MD  RESPONSIBLE PARTY:  Acct ID - Guarantor Home Phone Work Phone Relationship Acct Type  [id number] - [place],[phone]  Self P/F     [place], [place], [place] [address]     PLAN OF CARE and INTERVENTIONS:             GOALS OF CARE/ ADVANCE CARE PLANNING:  Goal is for patient to remain in her independent apartment. Patient is a DNR. SOCIAL/EMOTIONAL/SPIRITUAL ASSESSMENT/ INTERVENTIONS:  SW completed a telephonic check-in visit with patient. She reported that she was doing fine. She continues to have intermittent pain to her left leg, that causes discomfort when she walks. She continues to take Tylenol. She report that her appetite has declined, but she continues to eat at least two good meals, but the meals are smaller. She report that she continues to ambulate with her walker. She only go out of her apartment to get her hair done. Patient report that she is content in her apartment, reading, doing puzzles and watching TV in the evenings. Patient verbalized no other new concerns or changes in her condition. She remains open to ongoing palliative care support.  PATIENT/CAREGIVER EDUCATION/ COPING:  Patient seems to be coping well.  PERSONAL EMERGENCY PLAN:  Per facility protocol.  COMMUNITY RESOURCES COORDINATION/ HEALTH CARE NAVIGATION:  Patient has assistance through [facility]. FINANCIAL/LEGAL CONCERNS/INTERVENTIONS:  None.      SOCIAL HX:  Social History   Tobacco Use   Smoking status: Never   Smokeless tobacco: Never  Substance Use Topics   Alcohol use: No    CODE STATUS: DNR ADVANCED DIRECTIVES: No MOST FORM COMPLETE:  No HOSPICE EDUCATION PROVIDED: No  PPS: Patient ambulates around her apartment with a Rolator but her gait is unsteady and she is having increased pain and weakness to her leg. She is alert and  oriented x3, but is forgetful. She receives personal care assistance for daily  medication reminders and standby assistance with personal care needs.   Duration of  telephonic visit and documentation: 30 minutes  Katheren Puller, LCSW

## 2021-05-14 DIAGNOSIS — Z8616 Personal history of COVID-19: Secondary | ICD-10-CM | POA: Diagnosis not present

## 2021-05-21 DIAGNOSIS — Z20828 Contact with and (suspected) exposure to other viral communicable diseases: Secondary | ICD-10-CM | POA: Diagnosis not present

## 2021-05-28 DIAGNOSIS — Z8616 Personal history of COVID-19: Secondary | ICD-10-CM | POA: Diagnosis not present

## 2021-06-04 DIAGNOSIS — Z8616 Personal history of COVID-19: Secondary | ICD-10-CM | POA: Diagnosis not present

## 2021-06-11 DIAGNOSIS — Z20828 Contact with and (suspected) exposure to other viral communicable diseases: Secondary | ICD-10-CM | POA: Diagnosis not present

## 2021-06-18 DIAGNOSIS — Z20828 Contact with and (suspected) exposure to other viral communicable diseases: Secondary | ICD-10-CM | POA: Diagnosis not present

## 2021-06-25 DIAGNOSIS — Z20828 Contact with and (suspected) exposure to other viral communicable diseases: Secondary | ICD-10-CM | POA: Diagnosis not present

## 2021-07-02 DIAGNOSIS — Z20828 Contact with and (suspected) exposure to other viral communicable diseases: Secondary | ICD-10-CM | POA: Diagnosis not present

## 2021-07-09 DIAGNOSIS — Z20828 Contact with and (suspected) exposure to other viral communicable diseases: Secondary | ICD-10-CM | POA: Diagnosis not present

## 2021-07-10 DIAGNOSIS — E039 Hypothyroidism, unspecified: Secondary | ICD-10-CM | POA: Diagnosis not present

## 2021-07-10 DIAGNOSIS — D509 Iron deficiency anemia, unspecified: Secondary | ICD-10-CM | POA: Diagnosis not present

## 2021-07-10 DIAGNOSIS — I5022 Chronic systolic (congestive) heart failure: Secondary | ICD-10-CM | POA: Diagnosis not present

## 2021-07-10 DIAGNOSIS — M810 Age-related osteoporosis without current pathological fracture: Secondary | ICD-10-CM | POA: Diagnosis not present

## 2021-07-10 DIAGNOSIS — G459 Transient cerebral ischemic attack, unspecified: Secondary | ICD-10-CM | POA: Diagnosis not present

## 2021-07-10 DIAGNOSIS — I480 Paroxysmal atrial fibrillation: Secondary | ICD-10-CM | POA: Diagnosis not present

## 2021-07-10 DIAGNOSIS — M1612 Unilateral primary osteoarthritis, left hip: Secondary | ICD-10-CM | POA: Diagnosis not present

## 2021-07-10 DIAGNOSIS — K219 Gastro-esophageal reflux disease without esophagitis: Secondary | ICD-10-CM | POA: Diagnosis not present

## 2021-07-10 DIAGNOSIS — I5032 Chronic diastolic (congestive) heart failure: Secondary | ICD-10-CM | POA: Diagnosis not present

## 2021-07-16 DIAGNOSIS — Z20822 Contact with and (suspected) exposure to covid-19: Secondary | ICD-10-CM | POA: Diagnosis not present

## 2021-07-23 DIAGNOSIS — Z20828 Contact with and (suspected) exposure to other viral communicable diseases: Secondary | ICD-10-CM | POA: Diagnosis not present

## 2021-07-23 DIAGNOSIS — Z1159 Encounter for screening for other viral diseases: Secondary | ICD-10-CM | POA: Diagnosis not present

## 2021-07-29 ENCOUNTER — Encounter: Payer: Self-pay | Admitting: Cardiology

## 2021-07-30 ENCOUNTER — Ambulatory Visit: Payer: Medicare Other | Admitting: Cardiology

## 2021-07-30 ENCOUNTER — Encounter: Payer: Self-pay | Admitting: Cardiology

## 2021-07-30 ENCOUNTER — Other Ambulatory Visit: Payer: Self-pay

## 2021-07-30 VITALS — BP 129/70 | HR 55 | Temp 98.1°F | Resp 18 | Ht 64.0 in | Wt 151.2 lb

## 2021-07-30 DIAGNOSIS — Z66 Do not resuscitate: Secondary | ICD-10-CM | POA: Diagnosis not present

## 2021-07-30 DIAGNOSIS — I5042 Chronic combined systolic (congestive) and diastolic (congestive) heart failure: Secondary | ICD-10-CM

## 2021-07-30 DIAGNOSIS — Z1159 Encounter for screening for other viral diseases: Secondary | ICD-10-CM | POA: Diagnosis not present

## 2021-07-30 DIAGNOSIS — I480 Paroxysmal atrial fibrillation: Secondary | ICD-10-CM | POA: Diagnosis not present

## 2021-07-30 DIAGNOSIS — Z20828 Contact with and (suspected) exposure to other viral communicable diseases: Secondary | ICD-10-CM | POA: Diagnosis not present

## 2021-07-30 NOTE — Progress Notes (Signed)
Primary Physician/Referring:  Lajean Manes, MD  Patient ID: Madison Marquez, female    DOB: 02-08-1924, 87 y.o.   MRN: [id number]  Chief Complaint  Patient presents with   Atrial Fibrillation   Congestive Heart Failure   Follow-up    6 months   HPI:    Madison Marquez  is a 87 y.o. female  with chronic systolic and diastolic heart failure, CKD stage 3, hypertension, hypothyroidism, and paroxysmal atrial flutter and atrial fibrillation and is on chronic Amiodarone and maintains sinus rhythm, has underlying sinus bradycardia and low blood pressure hence not been able to titrate her beta-blockers up.   She now presents for a 6 month office visit for follow-up of congestive heart failure.  On the present medical therapy including home oxygen, however she has not used any home oxygen and also has not had any further PND or orthopnea or leg edema.  Today she has no specific complaints.  Past Medical History:  Diagnosis Date   Acute CHF (congestive heart failure) ([place]) 08/25/2017   Arthritis    "probably in my right knee" (08/25/2017)   [facility] Patient infection 1950s   Hypercholesterolemia    Hypothyroid    Migraine    "I've had 1 in my lifetime" (08/25/2017)   Past Surgical History:  Procedure Laterality Date   ABDOMINAL HYSTERECTOMY     ANKLE FRACTURE SURGERY Right    BLADDER SUSPENSION     BREAST LUMPECTOMY Left    BREAST SURGERY Left    "leaky nipple"   CARDIOVERSION N/A 04/14/2018   Procedure: CARDIOVERSION;  Surgeon: Adrian Prows, MD;  Location: [facility];  Service: Cardiovascular;  Laterality: N/A;   CARDIOVERSION N/A 05/05/2018   Procedure: CARDIOVERSION;  Surgeon: Nigel Mormon, MD;  Location: [facility];  Service: Cardiovascular;  Laterality: N/A;   CARPAL TUNNEL RELEASE Left 10/15/2016   Procedure: LEFT CARPAL TUNNEL RELEASE;  Surgeon: Daryll Brod, MD;  Location: [facility];  Service: Orthopedics;  Laterality: Left;   CATARACT EXTRACTION W/  INTRAOCULAR LENS  IMPLANT, BILATERAL Bilateral 2017   FRACTURE SURGERY     HERNIA REPAIR     LEFT HEART CATH AND CORONARY ANGIOGRAPHY N/A 08/29/2017   Procedure: LEFT HEART CATH AND CORONARY ANGIOGRAPHY;  Surgeon: Adrian Prows, MD;  Location: [facility];  Service: Cardiovascular;  Laterality: N/A;   UMBILICAL HERNIA REPAIR     Family History  Problem Relation Age of Onset   Heart failure Brother    Diabetes Brother    Cancer Sister     Social History   Tobacco Use   Smoking status: Never   Smokeless tobacco: Never  Substance Use Topics   Alcohol use: No   Marital Status: Widowed  ROS  Review of Systems  Constitutional: Negative for malaise/fatigue.  Cardiovascular:  Negative for chest pain, dyspnea on exertion and leg swelling.  Musculoskeletal:  Positive for joint pain.  Gastrointestinal:  Negative for melena.  Objective  Blood pressure 129/70, pulse (!) 55, temperature 98.1 F (36.7 C), temperature source Temporal, resp. rate 18, height _0  (1.626 m), weight 151 lb 3.2 oz (68.6 kg), SpO2 95 %.  Vitals with BMI 07/30/2021 01/26/2021 07/28/2020  Height _1  _2  _3   Weight 151 lbs 3 oz 150 lbs 149 lbs  BMI 25.94 93.81 82.99  Systolic 371 696 789  Diastolic 70 66 55  Pulse 55 56 60     Physical Exam Constitutional:      Comments:  petite  Neck:     Vascular: No carotid bruit or JVD.  Cardiovascular:     Rate and Rhythm: Normal rate and regular rhythm.     Pulses: Intact distal pulses.          Carotid pulses are 2+ on the right side and 2+ on the left side.    Heart sounds: No murmur heard.   No gallop.  Pulmonary:     Effort: Pulmonary effort is normal. No accessory muscle usage or respiratory distress.     Breath sounds: Normal breath sounds.  Abdominal:     General: Bowel sounds are normal.     Palpations: Abdomen is soft.  Musculoskeletal:     Right lower leg: No edema.     Left lower leg: No edema.   Laboratory examination:   No results for  input(s): NA, K, CL, CO2, GLUCOSE, BUN, CREATININE, CALCIUM, GFRNONAA, GFRAA in the last 8760 hours.  CrCl cannot be calculated (Patient's most recent lab result is older than the maximum 21 days allowed.).  CMP Latest Ref Rng & Units 07/28/2020 08/05/2019 03/25/2019  Glucose 65 - 99 mg/dL 83 83 54(L)  BUN 10 - 36 mg/dL _0 Creatinine 0.57 - 1.00 mg/dL 1.41(H) 1.56(H) 1.14(H)  Sodium 134 - 144 mmol/L 143 141 144  Potassium 3.5 - 5.2 mmol/L 4.2 3.9 4.7  Chloride 96 - 106 mmol/L 101 97 106  CO2 20 - 29 mmol/L 26 30(H) 24  Calcium 8.7 - 10.3 mg/dL 9.3 9.4 9.4  Total Protein 6.0 - 8.5 g/dL 6.6 - -  Total Bilirubin 0.0 - 1.2 mg/dL 0.5 - -  Alkaline Phos 44 - 121 IU/L 108 - -  AST 0 - 40 IU/L 21 - -  ALT 0 - 32 IU/L 16 - -   CBC Latest Ref Rng & Units 07/28/2020 08/11/2019 08/05/2019  WBC 3.4 - 10.8 x10E3/uL 5.7 - 6.2  Hemoglobin 11.1 - 15.9 g/dL 11.5 10.7(L) 10.6(L)  Hematocrit 34.0 - 46.6 % 34.0 32.8(L) 32.0(L)  Platelets 150 - 450 x10E3/uL 200 - 250      Component Value Date/Time   CHOL 169 08/25/2017 1303   TRIG 51 08/25/2017 1303   HDL 61 08/25/2017 1303   CHOLHDL 2.8 08/25/2017 1303   VLDL 10 08/25/2017 1303   LDLCALC 98 08/25/2017 1303   TSH No results for input(s): TSH in the last 8760 hours.    External labs:    Hemoglobin 11.500 G/ 07/28/2020 Platelets 200.000 x1 07/28/2020  Creatinine, Serum 1.410 mg/ 07/28/2020 Potassium 4.200 mm 07/28/2020 ALT (SGPT) 16.000 IU/ 07/28/2020  TSH 7.030 07/28/2020  Creatinine, Serum 1.360 mg/ 02/09/2020 Potassium 4.000 mm 02/09/2020   07/13/2019: Creatinine 1.39, EGFR 35, potassium 4.1, BMP otherwise normal.  BNP 394.  RBC 3.3, hemoglobin 10.4, hematocrit 31.9, RDW 16.4%, CBC otherwise normal.  Medications and allergies  No Known Allergies   Current Outpatient Medications on File Prior to Visit  Medication Sig Dispense Refill   acetaminophen (TYLENOL) 500 MG tablet Take 1 tablet by mouth daily.     amiodarone (PACERONE) 200 MG  tablet TAKE 1 TABLET(200 MG) BY MOUTH DAILY (Patient taking differently: Take 100 mg by mouth daily.) 90 tablet 1   Apoaequorin (PREVAGEN) 10 MG CAPS Take 1 tablet by mouth daily.     carvedilol (COREG) 6.25 MG tablet Take 1 tablet (6.25 mg total) by mouth 2 (two) times daily with a meal. Taking one half tab bid 180 tablet 1   Cholecalciferol (VITAMIN  D) 2000 units tablet Take 2,000 Units by mouth daily.      Cyanocobalamin (VITAMIN B-12) 5000 MCG TBDP Take 5,000 mcg by mouth daily.      Ferrous Sulfate (IRON) 325 (65 Fe) MG TABS Take 1 tablet by mouth 3 (three) times a week.     levothyroxine (SYNTHROID, LEVOTHROID) 75 MCG tablet Take 75 mcg by mouth daily before breakfast.      oxyCODONE-acetaminophen (PERCOCET/ROXICET) 5-325 MG tablet Take 1 tablet by mouth every 6 (six) hours as needed for severe pain. 15 tablet 0   torsemide (DEMADEX) 20 MG tablet Take 20 mg by mouth daily.     No current facility-administered medications on file prior to visit.    Radiology:   No results found.  Cardiac Studies:   Coronary angiogram 08/29/2017: Normal coronary arteries. Normal LV systolic function.  Direct current cardioversion 04/14/2018: A. Fibrillation: 120x1, 150x2, 150x3 to NSR.  Echocardiogram 03/05/2019: Left ventricle cavity is normal in size. Moderate concentric hypertrophy of the left ventricle. Moderate global hypokinesis.  Moderately depressed LV systolic function with visual EF 35-40%. Diastolic function not assessed due to possible atrial fibrillation. Calculated EF 35%. Left atrial cavity is mildly dilated. Trileaflet aortic valve. Mild aortic valve leaflet calcification. Moderate (Grade II) aortic regurgitation. Mild to moderate mitral regurgitation. Mild tricuspid regurgitation. Estimated pulmonary artery systolic pressure is 29 mmHg. Mild pulmonic regurgitation. No significant change compared to previous study on 08/26/2017.  Nocturnal oximetry 08/05/2019: SpO2 less than 88% 21  minutes, less than 89% 37 minutes, oxygen desaturation events 457, index 44.  Patient qualifies for oxygen supplementation by Medicare guidelines for group 1.    EKG  EKG 07/30/2021: Normal sinus rhythm/sinus bradycardia at rate of 57 bpm, inferior infarct old.  Anterolateral infarct old.  IVCD, nonspecific T abnormality.  No significant change from 01/26/2021.   03/11/2019: Atrial flutter with RVR at 138 bpm, left axis deviation, cannot exclude inferior infarct old. Poor R wave progression cannot exclude anterolateral infarct old. Compared to EKG 05/14/2018, A flutter is new.   Assessment     ICD-10-CM   1. Paroxysmal atrial fibrillation (HCC)  I48.0 EKG 12-Lead    2. Chronic combined systolic and diastolic heart failure (HCC)  I50.42     3. DNR (do not resuscitate)  Z66       No orders of the defined types were placed in this encounter.   There are no discontinued medications.  Recommendations:   MONZERAT HANDLER  is a 87 y.o. female  with chronic systolic and diastolic heart failure, CKD stage 3, hypertension, hypothyroidism, and paroxysmal atrial flutter and atrial fibrillation. Has symptomatic AF and has had cardioversion in past and is on chronic Amiodarone, and presently maintains sinus rhythm/ectopic atrial rhythm. Has underlying sinus bradycardia and low blood pressure hence not been able to titrate her beta-blockers up.   She is presently on conservative therapy and DNR.  She has done remarkably well and has remained out of the hospital and out of emergency rooms.  Today she is not in any acute decompensated heart failure.  I also do not hear the mitral regurgitation murmur that had previously heard.  She is not in any acute decompensated heart failure.  I am very pleased with her progress.  I will see her back on a as needed basis, I have advised her to continue with present medications.  In view of advanced age, no aggressive measures being performed, patient would like to be DNR  as well. She  is off of anticoagulation due to risk of fall.     Adrian Prows, MD, [facility] 07/30/2021, 2:09 PM Office: [phone] Pager: [phone]

## 2021-08-06 DIAGNOSIS — Z1159 Encounter for screening for other viral diseases: Secondary | ICD-10-CM | POA: Diagnosis not present

## 2021-08-06 DIAGNOSIS — Z20828 Contact with and (suspected) exposure to other viral communicable diseases: Secondary | ICD-10-CM | POA: Diagnosis not present

## 2021-08-10 DIAGNOSIS — Z20828 Contact with and (suspected) exposure to other viral communicable diseases: Secondary | ICD-10-CM | POA: Diagnosis not present

## 2021-08-10 DIAGNOSIS — Z1159 Encounter for screening for other viral diseases: Secondary | ICD-10-CM | POA: Diagnosis not present

## 2021-08-13 DIAGNOSIS — Z20828 Contact with and (suspected) exposure to other viral communicable diseases: Secondary | ICD-10-CM | POA: Diagnosis not present

## 2021-08-13 DIAGNOSIS — Z1159 Encounter for screening for other viral diseases: Secondary | ICD-10-CM | POA: Diagnosis not present

## 2021-08-15 DIAGNOSIS — Z20828 Contact with and (suspected) exposure to other viral communicable diseases: Secondary | ICD-10-CM | POA: Diagnosis not present

## 2021-08-15 DIAGNOSIS — Z1159 Encounter for screening for other viral diseases: Secondary | ICD-10-CM | POA: Diagnosis not present

## 2021-08-20 DIAGNOSIS — Z20828 Contact with and (suspected) exposure to other viral communicable diseases: Secondary | ICD-10-CM | POA: Diagnosis not present

## 2021-08-20 DIAGNOSIS — Z1159 Encounter for screening for other viral diseases: Secondary | ICD-10-CM | POA: Diagnosis not present

## 2021-08-22 DIAGNOSIS — M1612 Unilateral primary osteoarthritis, left hip: Secondary | ICD-10-CM | POA: Diagnosis not present

## 2021-08-22 DIAGNOSIS — I5022 Chronic systolic (congestive) heart failure: Secondary | ICD-10-CM | POA: Diagnosis not present

## 2021-08-22 DIAGNOSIS — D509 Iron deficiency anemia, unspecified: Secondary | ICD-10-CM | POA: Diagnosis not present

## 2021-08-22 DIAGNOSIS — Z1159 Encounter for screening for other viral diseases: Secondary | ICD-10-CM | POA: Diagnosis not present

## 2021-08-22 DIAGNOSIS — M810 Age-related osteoporosis without current pathological fracture: Secondary | ICD-10-CM | POA: Diagnosis not present

## 2021-08-22 DIAGNOSIS — I480 Paroxysmal atrial fibrillation: Secondary | ICD-10-CM | POA: Diagnosis not present

## 2021-08-22 DIAGNOSIS — E039 Hypothyroidism, unspecified: Secondary | ICD-10-CM | POA: Diagnosis not present

## 2021-08-22 DIAGNOSIS — K219 Gastro-esophageal reflux disease without esophagitis: Secondary | ICD-10-CM | POA: Diagnosis not present

## 2021-08-22 DIAGNOSIS — Z20828 Contact with and (suspected) exposure to other viral communicable diseases: Secondary | ICD-10-CM | POA: Diagnosis not present

## 2021-08-22 DIAGNOSIS — G459 Transient cerebral ischemic attack, unspecified: Secondary | ICD-10-CM | POA: Diagnosis not present

## 2021-08-22 DIAGNOSIS — I5032 Chronic diastolic (congestive) heart failure: Secondary | ICD-10-CM | POA: Diagnosis not present

## 2021-08-27 DIAGNOSIS — Z20828 Contact with and (suspected) exposure to other viral communicable diseases: Secondary | ICD-10-CM | POA: Diagnosis not present

## 2021-09-03 DIAGNOSIS — Z20828 Contact with and (suspected) exposure to other viral communicable diseases: Secondary | ICD-10-CM | POA: Diagnosis not present

## 2021-09-05 DIAGNOSIS — Z20828 Contact with and (suspected) exposure to other viral communicable diseases: Secondary | ICD-10-CM | POA: Diagnosis not present

## 2021-09-05 DIAGNOSIS — Z1159 Encounter for screening for other viral diseases: Secondary | ICD-10-CM | POA: Diagnosis not present

## 2021-09-10 DIAGNOSIS — Z20822 Contact with and (suspected) exposure to covid-19: Secondary | ICD-10-CM | POA: Diagnosis not present

## 2021-09-17 DIAGNOSIS — Z20822 Contact with and (suspected) exposure to covid-19: Secondary | ICD-10-CM | POA: Diagnosis not present

## 2021-09-24 DIAGNOSIS — Z20828 Contact with and (suspected) exposure to other viral communicable diseases: Secondary | ICD-10-CM | POA: Diagnosis not present

## 2021-10-12 ENCOUNTER — Other Ambulatory Visit: Payer: Medicare Other | Admitting: Hospice

## 2021-10-12 ENCOUNTER — Other Ambulatory Visit: Payer: Self-pay

## 2021-10-12 DIAGNOSIS — I509 Heart failure, unspecified: Secondary | ICD-10-CM | POA: Diagnosis not present

## 2021-10-12 DIAGNOSIS — M25552 Pain in left hip: Secondary | ICD-10-CM

## 2021-10-12 DIAGNOSIS — Z515 Encounter for palliative care: Secondary | ICD-10-CM

## 2021-10-12 DIAGNOSIS — R269 Unspecified abnormalities of gait and mobility: Secondary | ICD-10-CM | POA: Diagnosis not present

## 2021-10-12 NOTE — Progress Notes (Signed)
[facility] Consult Note Telephone: [phone]  Fax: [phone]  PATIENT NAME: Madison Marquez [address] [place] [address] [phone] (home)  DOB: 02/23/1924 MRN: [id number]  PRIMARY CARE PROVIDER:    Lajean Manes, MD,  [address] [place] [address] [phone]  REFERRING PROVIDER:   Lajean Manes, MD [address] [employer] [address] [place],  [place] [address] [phone]  RESPONSIBLE PARTY:   Self [phone] Madison Marquez is emergency contact. Contact Information     Name Relation Home Work [place] Son [phone]  [phone]   Madison Marquez Granddaughter   [phone]   Madison Marquez Granddaughter   [phone]        I met face to face with patient and family at home. Palliative Care was asked to follow this patient by consultation request of  Madison Manes, MD to address advance care planning, complex medical decision making and goals of care clarification. This is the initial visit.    ASSESSMENT AND / RECOMMENDATIONS:   Advance Care Planning: Our advance care planning conversation included a discussion about:    The value and importance of advance care planning  Exploration of goals of care in the event of a sudden injury or illness  Identification and preparation of a healthcare agent  Review and updating or creation of an  advance directive document .  CODE STATUS: Patient is a Do Not Resuscitate.   Goals of Care: Goals include to maximize quality of life and symptom management. MOST selections include limited additional intervention, IV fluid if indicated, antibiotics if indicated, no feeding tube. She has signed DNR and MOST form at home.  I spent 16  minutes providing this initial consultation. More than 50% of the time in this consultation was spent on counseling patient and coordinating  communication. --------------------------------------------------------------------------------------------------------------------------------------  Symptom Management/Plan: Gait disturbance: Education on falls precautions and use of rolling walker. CHF: Continue Torsemide as ordered; elevate BLE during the day as much as possible to promote circulation.  Adhere to fluid and salt limits.  Monitor closely and report weight gain of 3 pounds in a day or 5 pounds in a week. Left hip pain: managed with Tylenol.  Use of heating pad encouraged  Follow up: Palliative care will continue to follow for complex medical decision making, advance care planning, and clarification of goals. Return 6 weeks or prn. Encouraged to call provider sooner with any concerns.   Family /Caregiver/Community Supports: Patient lives independently at [facility].  HOSPICE ELIGIBILITY/DIAGNOSIS: TBD  Chief Complaint: Initial Palliative care visit  HISTORY OF PRESENT ILLNESS:  Madison Marquez is a 88 y.o. year old female  with multiple medical conditions including congestive heart failure gait, left hip.  History of A-fib migraine.  Patient denies pain/discomfort, she endorses weakness and gait disturbance for which she said  "I am aware of my limitations, I always use my walker". History obtained from review of EMR, discussion with primary team, caregiver, family and/or Madison Marquez.  Review and summarization of Epic records shows history from other than patient. Rest of 10 point ROS asked and negative.  I reviewed as needed, available labs, patient records, imaging, studies and related documents from the EMR.   Physical Exam: Height/Weight: 5 feet 4 inches/150 Ibs Constitutional: NAD General: Well groomed, cooperative EYES: anicteric sclera, lids intact, no discharge  ENMT: Moist mucous membrane CV: S1 S2, RRR, no LE edema Pulmonary: LCTA, no increased work of  breathing, no cough, Abdomen: active BS + 4 quadrants,  soft and non tender GU: no suprapubic tenderness MSK: weakness, sarcopenia, limited ROM Skin: warm and dry, no rashes or wounds on visible skin, hair loss and coolness to BLE, mycotic toenails Neuro:  weakness, otherwise non focal Psych: non-anxious affect Hem/lymph/immuno: no widespread bruising   PAST MEDICAL HISTORY:  Active Ambulatory Problems    Diagnosis Date Noted   Hypercholesterolemia 08/25/2017   Hypothyroid 08/25/2017   Atrial fibrillation ([facility]) 04/13/2018   Resolved Ambulatory Problems    Diagnosis Date Noted   Acute congestive heart failure ([facility]) 08/25/2017   Chest pressure 08/25/2017   Past Medical History:  Diagnosis Date   Acute CHF (congestive heart failure) ([facility]) 08/25/2017   Arthritis    Epstein Barr infection 1950s   Migraine     SOCIAL HX:  Social History   Tobacco Use   Smoking status: Never   Smokeless tobacco: Never  Substance Use Topics   Alcohol use: No     FAMILY HX:  Family History  Problem Relation Age of Onset   Heart failure Brother    Diabetes Brother    Cancer Sister       ALLERGIES: No Known Allergies    PERTINENT MEDICATIONS:  Outpatient Encounter Medications as of 10/12/2021  Medication Sig   acetaminophen (TYLENOL) 500 MG tablet Take 1 tablet by mouth daily.   amiodarone (PACERONE) 200 MG tablet TAKE 1 TABLET(200 MG) BY MOUTH DAILY (Patient taking differently: Take 100 mg by mouth daily.)   Apoaequorin (PREVAGEN) 10 MG CAPS Take 1 tablet by mouth daily.   B Complex-Biotin-FA (SUPER B-100) TABS Take by mouth.   carvedilol (COREG) 6.25 MG tablet Take 1 tablet (6.25 mg total) by mouth 2 (two) times daily with a meal. Taking one half tab bid   Cholecalciferol (VITAMIN D) 2000 units tablet Take 2,000 Units by mouth daily.    Cyanocobalamin (VITAMIN B-12) 5000 MCG TBDP Take 5,000 mcg by mouth daily.    Ferrous Sulfate (IRON) 325 (65 Fe) MG TABS Take 1 tablet by mouth 3 (three) times a week.   levothyroxine (SYNTHROID,  LEVOTHROID) 75 MCG tablet Take 75 mcg by mouth daily before breakfast.    oxyCODONE-acetaminophen (PERCOCET/ROXICET) 5-325 MG tablet Take 1 tablet by mouth every 6 (six) hours as needed for severe pain.   torsemide (DEMADEX) 20 MG tablet Take 20 mg by mouth daily.   No facility-administered encounter medications on file as of 10/12/2021.     Thank you for the opportunity to participate in the care of Madison Marquez.  The palliative care team will continue to follow. Please call our office at [phone] if we can be of additional assistance.   Note: Portions of this note were generated with Lobbyist. Dictation errors may occur despite best attempts at proofreading.  Teodoro Spray, NP

## 2021-10-23 DIAGNOSIS — I480 Paroxysmal atrial fibrillation: Secondary | ICD-10-CM | POA: Diagnosis not present

## 2021-10-23 DIAGNOSIS — I5032 Chronic diastolic (congestive) heart failure: Secondary | ICD-10-CM | POA: Diagnosis not present

## 2021-10-23 DIAGNOSIS — N1832 Chronic kidney disease, stage 3b: Secondary | ICD-10-CM | POA: Diagnosis not present

## 2021-10-23 DIAGNOSIS — Z79899 Other long term (current) drug therapy: Secondary | ICD-10-CM | POA: Diagnosis not present

## 2021-10-23 DIAGNOSIS — I700 Atherosclerosis of aorta: Secondary | ICD-10-CM | POA: Diagnosis not present

## 2021-11-09 ENCOUNTER — Other Ambulatory Visit: Payer: Medicare Other | Admitting: Hospice

## 2021-11-09 ENCOUNTER — Other Ambulatory Visit: Payer: Self-pay

## 2021-11-09 DIAGNOSIS — M25552 Pain in left hip: Secondary | ICD-10-CM | POA: Diagnosis not present

## 2021-11-09 DIAGNOSIS — Z515 Encounter for palliative care: Secondary | ICD-10-CM | POA: Diagnosis not present

## 2021-11-09 DIAGNOSIS — R269 Unspecified abnormalities of gait and mobility: Secondary | ICD-10-CM | POA: Diagnosis not present

## 2021-11-09 DIAGNOSIS — I509 Heart failure, unspecified: Secondary | ICD-10-CM | POA: Diagnosis not present

## 2021-11-09 NOTE — Progress Notes (Signed)
? ? ?[facility] ?[facility] Consult Note ?Telephone: [phone]  ?Fax: [phone] ? ?PATIENT NAME: Madison Marquez ?[place] [address] ?[phone] (home)  ?DOB: [birth year] ?MRN: [id number] ? ?PRIMARY CARE PROVIDER:    ?Madison Manes, MD,  ?[address] ?[place] [address] ?[phone] ? ?REFERRING PROVIDER:   ?Madison Manes, MD ?[address] ?[address] ?[place],  [place] [address] ?[phone] ? ?RESPONSIBLE PARTY:   Self [phone] ?Madison Marquez is emergency contact. ?Contact Information   ? ? Name Relation Home Work Mobile  ? Madison Marquez [phone]  [phone]  ? Madison Marquez Granddaughter   [phone]  ? Madison Marquez Granddaughter   [phone]  ? ?  ? ? ? ?I met face to face with patient and family at home. Palliative Care was asked to follow this patient by consultation request of  Madison Manes, MD to address advance care planning, complex medical decision making and goals of care clarification.   ASSESSMENT AND / RECOMMENDATIONS:  ? ?CODE STATUS: Patient is a Do Not Resuscitate.  ? ?Goals of Care: Goals include to maximize quality of life and symptom management. MOST selections include limited additional intervention, IV fluid if indicated, antibiotics if indicated, no feeding tube. ?She has signed DNR and MOST form at home. ? ?Symptom Management/Plan: ?Gait disturbance: Ambulatory with rolling walker. Education on falls precautions and use of rolling walker. No fall since last visit.  ?CHF: Continue Torsemide as ordered; elevate BLE during the day as much as possible to promote circulation.  Adhere to fluid and salt limits.  Monitor closely and report weight gain of 3 pounds in a day or 5 pounds in a week. ?Left hip pain: Stable; managed with Tylenol.  Use of heating pad encouraged ? ?Follow up: Palliative care will continue to follow for complex medical decision making, advance care planning, and  clarification of goals. Return 6 weeks or prn. Encouraged to call provider sooner with any concerns.  ? ?Family /Caregiver/Community Supports: Patient lives independently at [facility].  ?HOSPICE ELIGIBILITY/DIAGNOSIS: TBD ? ?Chief Complaint: Follow up ? ?HISTORY OF PRESENT ILLNESS:  Madison Marquez is a 88 y.o. year old female  with multiple medical conditions including congestive heart failure ,gait disturbance, left hip pain.  History of A-fib , migraine.  Patient denies pain/discomfort, she endorses weakness and gait disturbance.  History obtained from review of EMR, discussion with primary team, caregiver, family and/or Madison Marquez.  ?Review and summarization of Epic records shows history from other than patient. Rest of 10 point ROS asked and negative.  ?I reviewed as needed, available labs, patient records, imaging, studies and related documents from the EMR. ? ? ?Physical Exam: ?Height/Weight: 5 feet 4 inches/150 Ibs ?Constitutional: NAD ?General: Well groomed, cooperative ?EYES: anicteric sclera, lids intact, no discharge  ?ENMT: Moist mucous membrane ?CV: S1 S2, RRR, no LE edema ?Pulmonary: LCTA, no increased work of breathing, no cough, ?Abdomen: active BS + 4 quadrants, soft and non tender ?GU: no suprapubic tenderness ?MSK: weakness, sarcopenia, limited ROM ?Skin: warm and dry, no rashes or wounds on visible skin, hair loss and coolness to BLE, mycotic toenails ?Neuro:  weakness, otherwise non focal ?Psych: non-anxious affect ?Hem/lymph/immuno: no widespread bruising ? ? ?PAST MEDICAL HISTORY:  ?Active Ambulatory Problems  ?  Diagnosis Date Noted  ? Hypercholesterolemia 08/25/2017  ? Hypothyroid 08/25/2017  ? Atrial fibrillation ([facility]) 04/13/2018  ? ?Resolved Ambulatory Problems  ?  Diagnosis Date Noted  ? Acute  congestive heart failure ([facility]) 08/25/2017  ? Chest pressure 08/25/2017  ? ?Past Medical History:  ?Diagnosis Date  ? Acute CHF (congestive heart failure) ([facility]) 08/25/2017  ? Arthritis   ?  [facility] Patient infection [phone]  ? Migraine   ? ? ?SOCIAL HX:  ?Social History  ? ?Tobacco Use  ? Smoking status: Never  ? Smokeless tobacco: Never  ?Substance Use Topics  ? Alcohol use: No  ? ?  ?FAMILY HX:  ?Family History  ?Problem Relation Age of Onset  ? Heart failure Brother   ? Diabetes Brother   ? Cancer Sister   ?   ? ?ALLERGIES: No Known Allergies   ? ?PERTINENT MEDICATIONS:  ?Outpatient Encounter Medications as of 11/09/2021  ?Medication Sig  ? acetaminophen (TYLENOL) 500 MG tablet Take 1 tablet by mouth daily.  ? amiodarone (PACERONE) 200 MG tablet TAKE 1 TABLET(200 MG) BY MOUTH DAILY (Patient taking differently: Take 100 mg by mouth daily.)  ? Apoaequorin (PREVAGEN) 10 MG CAPS Take 1 tablet by mouth daily.  ? B Complex-Biotin-FA (SUPER B-100) TABS Take by mouth.  ? carvedilol (COREG) 6.25 MG tablet Take 1 tablet (6.25 mg total) by mouth 2 (two) times daily with a meal. Taking one half tab bid  ? Cholecalciferol (VITAMIN D) 2000 units tablet Take 2,000 Units by mouth daily.   ? Cyanocobalamin (VITAMIN B-12) 5000 MCG TBDP Take 5,000 mcg by mouth daily.   ? Ferrous Sulfate (IRON) 325 (65 Fe) MG TABS Take 1 tablet by mouth 3 (three) times a week.  ? levothyroxine (SYNTHROID, LEVOTHROID) 75 MCG tablet Take 75 mcg by mouth daily before breakfast.   ? oxyCODONE-acetaminophen (PERCOCET/ROXICET) 5-325 MG tablet Take 1 tablet by mouth every 6 (six) hours as needed for severe pain.  ? torsemide (DEMADEX) 20 MG tablet Take 20 mg by mouth daily.  ? ?No facility-administered encounter medications on file as of 11/09/2021.  ? ?I spent 45 minutes providing this consultation; time includes spent with patient/family, chart review and documentation. More than 50% of the time in this consultation was spent on care coordination. ? ?Thank you for the opportunity to participate in the care of Madison Marquez.  The palliative care team will continue to follow. Please call our office at [phone] if we can be of additional  assistance.  ? ?Note: Portions of this note were generated with Lobbyist. Dictation errors may occur despite best attempts at proofreading. ? ?Teodoro Spray, NP  ? ?  ?

## 2021-12-11 ENCOUNTER — Other Ambulatory Visit: Payer: Self-pay | Admitting: Cardiology

## 2021-12-11 NOTE — Telephone Encounter (Signed)
Pt needs a refill.

## 2021-12-16 ENCOUNTER — Other Ambulatory Visit: Payer: Self-pay | Admitting: Cardiology

## 2021-12-20 ENCOUNTER — Other Ambulatory Visit: Payer: Medicare Other | Admitting: Hospice

## 2021-12-20 DIAGNOSIS — I509 Heart failure, unspecified: Secondary | ICD-10-CM

## 2021-12-20 DIAGNOSIS — M25552 Pain in left hip: Secondary | ICD-10-CM

## 2021-12-20 DIAGNOSIS — Z515 Encounter for palliative care: Secondary | ICD-10-CM

## 2021-12-20 DIAGNOSIS — R269 Unspecified abnormalities of gait and mobility: Secondary | ICD-10-CM | POA: Diagnosis not present

## 2021-12-20 DIAGNOSIS — Z20822 Contact with and (suspected) exposure to covid-19: Secondary | ICD-10-CM | POA: Diagnosis not present

## 2021-12-20 NOTE — Progress Notes (Signed)
? ? ?Manufacturing engineer ?Community Palliative Care Consult Note ?Telephone: [phone]  ?Fax: [phone] ? ?PATIENT NAME: Madison Marquez [place] [address] ?[phone] (home)  ?DOB: 07/08/1924 ?MRN: [id number] ? ?PRIMARY CARE PROVIDER:    ?Lajean Manes, MD,  ?[address] ?[place] [address] ?[phone] ? ?REFERRING PROVIDER:   ?Lajean Manes, MD ?[address] ?[address] ?[place],  [place] [address] ?[phone] ? ?RESPONSIBLE PARTY:   Self [phone] ?[name] is emergency contact. ?Contact Information   ? ? Name Relation Home Work Mobile  ? Loretha Brasil [phone]  [phone]  ? Dyann Kief Granddaughter   [phone]  ? clayton,rebecca Granddaughter   [phone]  ? ?  ? ?TELEHEALTH VISIT STATEMENT ?Due to the COVID-19 crisis, this visit was done via telemedicine from my office and it was initiated and consent by this patient and or family.  ?I connected with patient OR PROXY by a telephone/video  and verified that I am speaking with the correct person. I discussed the limitations of evaluation and management by telemedicine. Patient/proxy expressed understanding and agreed to proceed. ?Palliative Care was asked to follow this patient to address advance care planning, complex medical decision making and goals of care clarification.   ? Palliative Care was asked to follow this patient by consultation request of  Lajean Manes, MD to address advance care planning, complex medical decision making and goals of care clarification.   ? ? ASSESSMENT AND / RECOMMENDATIONS:  ? ?CODE STATUS: Patient is a Do Not Resuscitate.  ? ?Goals of Care: Goals include to maximize quality of life and symptom management. MOST selections include limited additional intervention, IV fluid if indicated, antibiotics if indicated, no feeding tube. ?She has signed DNR and MOST form at home. ? ?Visit consisted of counseling and education dealing with the complex  and emotionally intense issues of symptom management and palliative care in the setting of serious and potentially life-threatening illness.  Patient reports her Darrick Meigs faith keeps her going.  She prays and breathes her Bible every day.  Therapeutic listening and validation provided.  Palliative care team will continue to support patient, patient's family, and medical team. ? ?Symptom Management/Plan: ?Gait disturbance: Compliance with use of rolling walker for ambulation.  No fall since last visit.  Education reiterated on fall precautions. ?CHF: chronic systolic heart failure.Continue Torsemide as ordered; elevate BLE during the day as much as possible to promote circulation.  Adhere to fluid and salt limits. Monitor closely and report weight gain of 3 pounds in a day or 5 pounds in a week. ?Left hip pain: Stable; managed with Tylenol.  Use of heating pad encouraged ? ?Follow up: Palliative care will continue to follow for complex medical decision making, advance care planning, and clarification of goals. Return 6 weeks or prn. Encouraged to call provider sooner with any concerns.  ? ?Family /Caregiver/Community Supports: Patient lives independently at [facility].  ?HOSPICE ELIGIBILITY/DIAGNOSIS: TBD ? ?Chief Complaint: Follow up ? ?HISTORY OF PRESENT ILLNESS:  Madison Marquez is a 88 y.o. year old female  with multiple medical conditions including congestive heart failure ,gait disturbance, left hip pain.  History of A-fib , migraine.  Patient denies pain/discomfort, she endorses weakness and gait disturbance.  History obtained from review of EMR, discussion with primary team, caregiver, family and/or Ms. Granillo.  Patient denies pain/discomfort stating that she has good and bad days but overall doing well ?Review and summarization of Epic records shows history  from other than patient.  ?Rest of 10 point ROS asked and negative.  ?I reviewed as needed, available labs, patient records, imaging, studies and  related documents from the EMR. ? ? ?PAST MEDICAL HISTORY:  ?Active Ambulatory Problems  ?  Diagnosis Date Noted  ? Hypercholesterolemia 08/25/2017  ? Hypothyroid 08/25/2017  ? Atrial fibrillation ([facility]) 04/13/2018  ? ?Resolved Ambulatory Problems  ?  Diagnosis Date Noted  ? Acute congestive heart failure ([facility]) 08/25/2017  ? Chest pressure 08/25/2017  ? ?Past Medical History:  ?Diagnosis Date  ? Acute CHF (congestive heart failure) ([facility]) 08/25/2017  ? Arthritis   ? [place] Patient infection [phone]  ? Migraine   ? ? ?SOCIAL HX:  ?Social History  ? ?Tobacco Use  ? Smoking status: Never  ? Smokeless tobacco: Never  ?Substance Use Topics  ? Alcohol use: No  ? ?  ?FAMILY HX:  ?Family History  ?Problem Relation Age of Onset  ? Heart failure Brother   ? Diabetes Brother   ? Cancer Sister   ?   ? ?ALLERGIES: No Known Allergies   ? ?PERTINENT MEDICATIONS:  ?Outpatient Encounter Medications as of 12/20/2021  ?Medication Sig  ? acetaminophen (TYLENOL) 500 MG tablet Take 1 tablet by mouth daily.  ? amiodarone (PACERONE) 200 MG tablet Take 0.5 tablets (100 mg total) by mouth daily.  ? Apoaequorin (PREVAGEN) 10 MG CAPS Take 1 tablet by mouth daily.  ? B Complex-Biotin-FA (SUPER B-100) TABS Take by mouth.  ? carvedilol (COREG) 6.25 MG tablet TAKE 1 TABLET BY MOUTH TWICE DAILY WITH A MEAL  ? Cholecalciferol (VITAMIN D) 2000 units tablet Take 2,000 Units by mouth daily.   ? Cyanocobalamin (VITAMIN B-12) 5000 MCG TBDP Take 5,000 mcg by mouth daily.   ? Ferrous Sulfate (IRON) 325 (65 Fe) MG TABS Take 1 tablet by mouth 3 (three) times a week.  ? levothyroxine (SYNTHROID, LEVOTHROID) 75 MCG tablet Take 75 mcg by mouth daily before breakfast.   ? oxyCODONE-acetaminophen (PERCOCET/ROXICET) 5-325 MG tablet Take 1 tablet by mouth every 6 (six) hours as needed for severe pain.  ? torsemide (DEMADEX) 20 MG tablet Take 20 mg by mouth daily.  ? ?No facility-administered encounter medications on file as of 12/20/2021.  ? ?I spent 30 minutes providing  this consultation; time includes spent with patient/family, chart review and documentation. More than 50% of the time in this consultation was spent on care coordination. ? ?Thank you for the opportunity to participate in the care of Ms. Wallington.  The palliative care team will continue to follow. Please call our office at [phone] if we can be of additional assistance.  ? ?Note: Portions of this note were generated with Lobbyist. Dictation errors may occur despite best attempts at proofreading. ? ?Teodoro Spray, NP  ? ?  ?

## 2022-02-14 ENCOUNTER — Other Ambulatory Visit: Payer: Medicare Other

## 2022-02-14 DIAGNOSIS — Z515 Encounter for palliative care: Secondary | ICD-10-CM

## 2022-03-07 NOTE — Progress Notes (Signed)
COMMUNITY PALLIATIVE CARE SW NOTE  PATIENT NAME: Madison Marquez DOB: 11-Apr-1924 MRN: [id number]  PRIMARY CARE PROVIDER: Lajean Manes, MD  RESPONSIBLE PARTY:  Acct ID - Guarantor Home Phone Work Phone Relationship Acct Type  [id number] - Gladd,M* [phone]  Self P/F     [address], [place], [place] [address]   SOCIAL WORK ENCOUNTER  PC SW completed a follow-up visit with patient to assess her needs and comfort. Patient resides at [facility]. 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 Patient denied pain. She report ongoing intermittent pain and weakness to her left leg that she continues to treat with Tylenol. Her appetite remains fair. Patient continues to ambulate with her rollator walker. She advised she is taking her time and being careful to avoid falls. SW engaged patient in open dialogue. She denied any social work needs at this time. SW extended support to her and reinforced contact information.  No other concerns noted.    CODE STATUS: DNR ADVANCED DIRECTIVES: No MOST FORM COMPLETE:  No HOSPICE EDUCATION PROVIDED: No  Duration of visit and documentation: 50 minutes.  [address] [place], [place]

## 2022-04-12 ENCOUNTER — Other Ambulatory Visit: Payer: Medicare Other | Admitting: Hospice

## 2022-04-12 DIAGNOSIS — Z515 Encounter for palliative care: Secondary | ICD-10-CM

## 2022-04-12 DIAGNOSIS — R269 Unspecified abnormalities of gait and mobility: Secondary | ICD-10-CM

## 2022-04-12 DIAGNOSIS — I509 Heart failure, unspecified: Secondary | ICD-10-CM | POA: Diagnosis not present

## 2022-04-12 DIAGNOSIS — M25552 Pain in left hip: Secondary | ICD-10-CM | POA: Diagnosis not present

## 2022-04-12 NOTE — Progress Notes (Signed)
[facility] Consult Note Telephone: [phone]  Fax: [phone]  PATIENT NAME: Madison Marquez [address] [place] [address] [phone] (home)  DOB: 09-06-1923 MRN: [id number]  PRIMARY CARE PROVIDER:    Lajean Manes, MD,  [place] [place] [place] [address] [phone]  REFERRING PROVIDER:   Lajean Manes, MD [address] [address] [place],  [place] [address] [phone]  RESPONSIBLE PARTY:   Self [phone] [name] is emergency contact. Contact Information     Name Relation Home Work [place] Son [phone]  [phone]   Dyann Kief Granddaughter   [place] Granddaughter   [phone]       Palliative Care was asked to follow this patient by consultation request of  Lajean Manes, MD to address advance care planning, complex medical decision making and goals of care clarification.     ASSESSMENT AND / RECOMMENDATIONS:   CODE STATUS: Patient is a Do Not Resuscitate.   Goals of Care: Goals include to maximize quality of life and symptom management. MOST selections include limited additional intervention, IV fluid if indicated, antibiotics if indicated, no feeding tube. She has signed DNR and MOST form at home.  Visit consisted of counseling and education dealing with the complex and emotionally intense issues of symptom management and palliative care in the setting of serious and potentially life-threatening illness.  Patient reports her Darrick Meigs faith keeps her going.  She continues to read her Bible every day. Palliative care team will continue to support patient, patient's family, and medical team.  Symptom Management/Plan: Gait disturbance: No fall since last visit. Continue use of rolling walker for ambulation for support and help prevent fall. Education reiterated on fall precautions. CHF: chronic systolic heart failure.Continue  Torsemide as ordered; elevate BLE during the day as much as possible to promote circulation.  Adhere to fluid and salt limits. Monitor closely and report weight gain of 3 pounds in a day or 5 pounds in a week. Left hip pain: Continue Tylenol as ordered. Use of heating pad encouraged  Follow up: Palliative care will continue to follow for complex medical decision making, advance care planning, and clarification of goals. Return 6 weeks or prn. Encouraged to call provider sooner with any concerns.   Family /Caregiver/Community Supports: Patient lives independently at [facility].  HOSPICE ELIGIBILITY/DIAGNOSIS: TBD  Chief Complaint: Follow up  HISTORY OF PRESENT ILLNESS:  Madison Marquez is a 88 y.o. year old female  with multiple medical conditions including congestive heart failure ,gait disturbance, left hip pain.  History of A-fib , migraine.  Patient denies pain/discomfort, she endorses being in good spirit.  History obtained from review of EMR, discussion with primary team, caregiver, family and/or Ms. Ketter.  Patient denies pain/discomfort stating that she has good and bad days but overall doing well Review and summarization of Epic records shows history from other than patient.  Rest of 10 point ROS asked and negative.  I reviewed as needed, available labs, patient records, imaging, studies and related documents from the EMR.   PAST MEDICAL HISTORY:  Active Ambulatory Problems    Diagnosis Date Noted   Hypercholesterolemia 08/25/2017   Hypothyroid 08/25/2017   Atrial fibrillation ([facility]) 04/13/2018   Resolved Ambulatory Problems    Diagnosis Date Noted   Acute congestive heart failure ([facility]) 08/25/2017   Chest pressure 08/25/2017   Past Medical History:  Diagnosis Date   Acute CHF (congestive  heart failure) ([facility]) 08/25/2017   Arthritis    Epstein Barr infection 1950s   Migraine     SOCIAL HX:  Social History   Tobacco Use   Smoking status: Never   Smokeless tobacco:  Never  Substance Use Topics   Alcohol use: No     FAMILY HX:  Family History  Problem Relation Age of Onset   Heart failure Brother    Diabetes Brother    Cancer Sister       ALLERGIES: No Known Allergies    PERTINENT MEDICATIONS:  Outpatient Encounter Medications as of 04/12/2022  Medication Sig   acetaminophen (TYLENOL) 500 MG tablet Take 1 tablet by mouth daily.   amiodarone (PACERONE) 200 MG tablet Take 0.5 tablets (100 mg total) by mouth daily.   Apoaequorin (PREVAGEN) 10 MG CAPS Take 1 tablet by mouth daily.   B Complex-Biotin-FA (SUPER B-100) TABS Take by mouth.   carvedilol (COREG) 6.25 MG tablet TAKE 1 TABLET BY MOUTH TWICE DAILY WITH A MEAL   Cholecalciferol (VITAMIN D) 2000 units tablet Take 2,000 Units by mouth daily.    Cyanocobalamin (VITAMIN B-12) 5000 MCG TBDP Take 5,000 mcg by mouth daily.    Ferrous Sulfate (IRON) 325 (65 Fe) MG TABS Take 1 tablet by mouth 3 (three) times a week.   levothyroxine (SYNTHROID, LEVOTHROID) 75 MCG tablet Take 75 mcg by mouth daily before breakfast.    oxyCODONE-acetaminophen (PERCOCET/ROXICET) 5-325 MG tablet Take 1 tablet by mouth every 6 (six) hours as needed for severe pain.   torsemide (DEMADEX) 20 MG tablet Take 20 mg by mouth daily.   No facility-administered encounter medications on file as of 04/12/2022.   I spent 60 minutes providing this consultation; time includes spent with patient/family, chart review and documentation. More than 50% of the time in this consultation was spent on care coordination.  Thank you for the opportunity to participate in the care of Ms. Griffing.  The palliative care team will continue to follow. Please call our office at [phone] if we can be of additional assistance.   Note: Portions of this note were generated with Lobbyist. Dictation errors may occur despite best attempts at proofreading.  Teodoro Spray, NP

## 2022-04-24 DIAGNOSIS — D509 Iron deficiency anemia, unspecified: Secondary | ICD-10-CM | POA: Diagnosis not present

## 2022-04-24 DIAGNOSIS — Z0000 Encounter for general adult medical examination without abnormal findings: Secondary | ICD-10-CM | POA: Diagnosis not present

## 2022-04-24 DIAGNOSIS — Z79899 Other long term (current) drug therapy: Secondary | ICD-10-CM | POA: Diagnosis not present

## 2022-04-24 DIAGNOSIS — I5022 Chronic systolic (congestive) heart failure: Secondary | ICD-10-CM | POA: Diagnosis not present

## 2022-04-24 DIAGNOSIS — I480 Paroxysmal atrial fibrillation: Secondary | ICD-10-CM | POA: Diagnosis not present

## 2022-04-24 DIAGNOSIS — N1832 Chronic kidney disease, stage 3b: Secondary | ICD-10-CM | POA: Diagnosis not present

## 2022-04-24 DIAGNOSIS — E039 Hypothyroidism, unspecified: Secondary | ICD-10-CM | POA: Diagnosis not present

## 2022-07-14 ENCOUNTER — Other Ambulatory Visit: Payer: Self-pay | Admitting: Cardiology

## 2022-08-07 DIAGNOSIS — D509 Iron deficiency anemia, unspecified: Secondary | ICD-10-CM | POA: Diagnosis not present

## 2022-08-07 DIAGNOSIS — N1832 Chronic kidney disease, stage 3b: Secondary | ICD-10-CM | POA: Diagnosis not present

## 2022-08-07 DIAGNOSIS — I480 Paroxysmal atrial fibrillation: Secondary | ICD-10-CM | POA: Diagnosis not present

## 2022-08-07 DIAGNOSIS — I5022 Chronic systolic (congestive) heart failure: Secondary | ICD-10-CM | POA: Diagnosis not present

## 2022-08-07 DIAGNOSIS — E039 Hypothyroidism, unspecified: Secondary | ICD-10-CM | POA: Diagnosis not present

## 2022-09-02 ENCOUNTER — Other Ambulatory Visit: Payer: Medicare Other | Admitting: Hospice

## 2022-09-02 DIAGNOSIS — I509 Heart failure, unspecified: Secondary | ICD-10-CM

## 2022-09-02 DIAGNOSIS — Z515 Encounter for palliative care: Secondary | ICD-10-CM | POA: Diagnosis not present

## 2022-09-02 DIAGNOSIS — M25552 Pain in left hip: Secondary | ICD-10-CM | POA: Diagnosis not present

## 2022-09-02 DIAGNOSIS — R269 Unspecified abnormalities of gait and mobility: Secondary | ICD-10-CM | POA: Diagnosis not present

## 2022-09-02 NOTE — Progress Notes (Signed)
[facility] Consult Note Telephone: [phone]  Fax: [phone]  PATIENT NAME: Madison Marquez [address] [place] [address] [phone] (home)  DOB: 1923/09/03 MRN: [id number]  PRIMARY CARE PROVIDER:    Lajean Manes, MD,  [place] [place] [address] [place] [address] [phone]  REFERRING PROVIDER:   Lajean Manes, MD [address] [place],   [address] [phone]  RESPONSIBLE PARTY:   Self [phone] [name] is emergency contact. Contact Information     Name Relation Home Work [place] Son [phone]  [phone]   Dyann Kief Granddaughter   [place] Granddaughter   [phone]       Palliative Care was asked to follow this patient by consultation request of  Lajean Manes, MD to address advance care planning, complex medical decision making and goals of care clarification.    Visit consisted of counseling and education dealing with the complex and emotionally intense issues of symptom management and palliative care in the setting of serious and potentially life-threatening illness. Palliative care team will continue to support patient, patient's family, and medical team. Patient reads her bible everyday; her Darrick Meigs faith keeps her going.   She continues to do jigsaw and cross word puzzles; continues to go to do her laundry. Palliative care team will continue to support patient, patient's family, and medical team.   ASSESSMENT AND / RECOMMENDATIONS:   CODE STATUS: Patient is a Do Not Resuscitate.   Goals of Care: Goals include to maximize quality of life and symptom management. MOST selections include limited additional intervention, IV fluid if indicated, antibiotics if indicated, no feeding tube. She has signed DNR and MOST form at home.    Symptom Management/Plan: Gait disturbance: High fall risk. No fall since last visit.  Continue use of rolling walker for ambulation for support and help prevent fall. Education reiterated on fall precautions.  CHF: chronic systolic heart failure, no exacerbation, no edema or shortness of breath. Continue Torsemide as ordered; elevate BLE during the day as much as possible to promote circulation.  Adhere to fluid and salt limits. Monitor closely and report weight gain of 3 pounds in a day or 5 pounds in a week. Left hip pain: Managed with Tylenol . Use of heating pad encouraged  Follow up: Palliative care will continue to follow for complex medical decision making, advance care planning, and clarification of goals. Return 6 weeks or prn. Encouraged to call provider sooner with any concerns.   Family /Caregiver/Community Supports: Patient lives independently at [facility].  HOSPICE ELIGIBILITY/DIAGNOSIS: TBD  Chief Complaint: Follow up  HISTORY OF PRESENT ILLNESS:  Madison Marquez is a 89 y.o. year old female  with multiple medical conditions including congestive heart failure ,gait disturbance, left hip pain.  History of A-fib , migraine.  Patient denies pain/discomfort, she endorses being in good spirit.  History obtained from review of EMR, discussion with primary team, caregiver, family and/or Ms. Laurent.  No fall , no hospitalization since last visit; reports left hip pain well managed, no edema no shortness of breath.  She continues to live in independent living, reports she is overall doing well.  Review and summarization of Epic records shows history from other than patient.  Rest of 10 point ROS asked and negative.  I reviewed as needed, available labs, patient records, imaging, studies and related documents from the EMR.   PAST MEDICAL HISTORY:  Active  Ambulatory Problems    Diagnosis Date Noted   Hypercholesterolemia 08/25/2017   Hypothyroid 08/25/2017   Atrial fibrillation ([facility]) 04/13/2018   Resolved Ambulatory Problems    Diagnosis Date Noted   Acute congestive  heart failure ([facility]) 08/25/2017   Chest pressure 08/25/2017   Past Medical History:  Diagnosis Date   Acute CHF (congestive heart failure) ([facility]) 08/25/2017   Arthritis    Epstein Barr infection 1950s   Migraine     SOCIAL HX:  Social History   Tobacco Use   Smoking status: Never   Smokeless tobacco: Never  Substance Use Topics   Alcohol use: No     FAMILY HX:  Family History  Problem Relation Age of Onset   Heart failure Brother    Diabetes Brother    Cancer Sister       ALLERGIES: No Known Allergies    PERTINENT MEDICATIONS:  Outpatient Encounter Medications as of 09/02/2022  Medication Sig   acetaminophen (TYLENOL) 500 MG tablet Take 1 tablet by mouth daily.   amiodarone (PACERONE) 200 MG tablet Take 0.5 tablets (100 mg total) by mouth daily.   Apoaequorin (PREVAGEN) 10 MG CAPS Take 1 tablet by mouth daily.   B Complex-Biotin-FA (SUPER B-100) TABS Take by mouth.   carvedilol (COREG) 6.25 MG tablet TAKE 1 TABLET BY MOUTH TWICE DAILY WITH A MEAL   Cholecalciferol (VITAMIN D) 2000 units tablet Take 2,000 Units by mouth daily.    Cyanocobalamin (VITAMIN B-12) 5000 MCG TBDP Take 5,000 mcg by mouth daily.    Ferrous Sulfate (IRON) 325 (65 Fe) MG TABS Take 1 tablet by mouth 3 (three) times a week.   levothyroxine (SYNTHROID, LEVOTHROID) 75 MCG tablet Take 75 mcg by mouth daily before breakfast.    oxyCODONE-acetaminophen (PERCOCET/ROXICET) 5-325 MG tablet Take 1 tablet by mouth every 6 (six) hours as needed for severe pain.   torsemide (DEMADEX) 20 MG tablet Take 20 mg by mouth daily.   No facility-administered encounter medications on file as of 09/02/2022.   I spent 60 minutes providing this consultation; time includes spent with patient/family, chart review and documentation. More than 50% of the time in this consultation was spent on care coordination.  Thank you for the opportunity to participate in the care of Ms. Jurney.  The palliative care team will continue to  follow. Please call our office at [phone] if we can be of additional assistance.   Note: Portions of this note were generated with Lobbyist. Dictation errors may occur despite best attempts at proofreading.  Teodoro Spray, NP

## 2022-10-02 ENCOUNTER — Other Ambulatory Visit: Payer: Medicare Other | Admitting: Hospice

## 2022-10-02 DIAGNOSIS — Z515 Encounter for palliative care: Secondary | ICD-10-CM | POA: Diagnosis not present

## 2022-10-02 DIAGNOSIS — I509 Heart failure, unspecified: Secondary | ICD-10-CM | POA: Diagnosis not present

## 2022-10-02 DIAGNOSIS — R269 Unspecified abnormalities of gait and mobility: Secondary | ICD-10-CM | POA: Diagnosis not present

## 2022-10-02 DIAGNOSIS — M25552 Pain in left hip: Secondary | ICD-10-CM | POA: Diagnosis not present

## 2022-10-02 NOTE — Progress Notes (Signed)
[facility] Consult Note Telephone: [phone]  Fax: [phone]  PATIENT NAME: Madison Marquez [address] [place] [address] [phone] (home)  DOB: 1924/02/28 MRN: [id number]  PRIMARY CARE PROVIDER:    Lajean Manes, MD,  [place]. [address] [employer] [address] [place] [address] [phone]  REFERRING PROVIDER:   Lajean Manes, MD [address] [place] [address] [place],  [place] [address] [phone]  RESPONSIBLE PARTY:   Self [phone] [name] is emergency contact. Contact Information     Name Relation Home Work [place] Son [phone]  [phone]   Dyann Kief Granddaughter   [place] Granddaughter   [phone]       Palliative Care was asked to follow this patient by consultation request of  Lajean Manes, MD to address advance care planning, complex medical decision making and goals of care clarification.    Visit consisted of counseling and education dealing with the complex and emotionally intense issues of symptom management and palliative care in the setting of serious and potentially life-threatening illness.   Patient continues to read her bible everyday; her Darrick Meigs faith keeps her going.   She continues to do jigsaw and cross word puzzles; continues to go to do her laundry. NP provided ample validation and emotional support.  Palliative care team will continue to support patient, patient's family, and medical team.   ASSESSMENT AND / RECOMMENDATIONS:   CODE STATUS: Patient is a Do Not Resuscitate.   Goals of Care: Goals include to maximize quality of life and symptom management. MOST selections include limited additional intervention, IV fluid if indicated, antibiotics if indicated, no feeding tube. She has signed DNR and MOST form at home.    Symptom Management/Plan: Gait disturbance:  No fall since last visit. Patient reports compliance with use  of rolling walker for ambulation for support and to help prevent fall. Education reiterated on high fall risk status and fall precautions.  CHF: Stable. chronic systolic heart failure, no exacerbation, no edema or shortness of breath. Continue Torsemide as ordered; elevate BLE during the day as much as possible to promote circulation.  Adhere to fluid and salt limits. Monitor closely and report weight gain of 3 pounds in a day or 5 pounds in a week. Left hip pain: Managed with Tylenol . Use of heating pad encouraged  Follow up: Palliative care will continue to follow for complex medical decision making, advance care planning, and clarification of goals. Return 6 weeks or prn. Encouraged to call provider sooner with any concerns.   Family /Caregiver/Community Supports: Patient lives independently at [facility].  HOSPICE ELIGIBILITY/DIAGNOSIS: TBD  Chief Complaint: Follow up  HISTORY OF PRESENT ILLNESS:  Madison Marquez is a 89 y.o. year old female  with multiple medical conditions including congestive heart failure ,gait disturbance, left hip pain.  History of A-fib , migraine.  Patient has no complaints, denies pain/discomfort, she endorses being in good spirit and content.  History obtained from review of EMR, discussion with primary team, caregiver, family and/or Ms. Coad.  She says she is content and has no complains. She continues to live in independent living, reports she is overall doing well.  Review and summarization of Epic records shows history from other than patient.  Rest of 10 point ROS asked and negative.  I reviewed as needed, available labs, patient records, imaging, studies and related documents from the EMR.  Physical Exam: Constitutional: NAD EYES: anicteric  sclera, lids intact, no discharge, wears eye glasses ENMT: intact hearing, oral mucous membranes moist, dentition intact CV: S1S2, RRR, no LE edema Pulmonary: LCTA, no increased work of breathing, no cough, room  air Abdomen: intake 100%, normo-active BS + 4 quadrants, soft and non tender MSK: sarcopenia, moves all extremities, ambulatory with rolling walker Skin: warm and dry, no rashes or wounds on visible skin Neuro:  no generalized weakness,  no cognitive impairment Psych: non-anxious affect, A and O x 3 Hem/lymph/immuno: no widespread bruising  PAST MEDICAL HISTORY:  Active Ambulatory Problems    Diagnosis Date Noted   Hypercholesterolemia 08/25/2017   Hypothyroid 08/25/2017   Atrial fibrillation ([facility]) 04/13/2018   Resolved Ambulatory Problems    Diagnosis Date Noted   Acute congestive heart failure ([facility]) 08/25/2017   Chest pressure 08/25/2017   Past Medical History:  Diagnosis Date   Acute CHF (congestive heart failure) ([facility]) 08/25/2017   Arthritis    Epstein Barr infection 1950s   Migraine     SOCIAL HX:  Social History   Tobacco Use   Smoking status: Never   Smokeless tobacco: Never  Substance Use Topics   Alcohol use: No     FAMILY HX:  Family History  Problem Relation Age of Onset   Heart failure Brother    Diabetes Brother    Cancer Sister       ALLERGIES: No Known Allergies    PERTINENT MEDICATIONS:  Outpatient Encounter Medications as of 10/02/2022  Medication Sig   acetaminophen (TYLENOL) 500 MG tablet Take 1 tablet by mouth daily.   amiodarone (PACERONE) 200 MG tablet Take 0.5 tablets (100 mg total) by mouth daily.   Apoaequorin (PREVAGEN) 10 MG CAPS Take 1 tablet by mouth daily.   B Complex-Biotin-FA (SUPER B-100) TABS Take by mouth.   carvedilol (COREG) 6.25 MG tablet TAKE 1 TABLET BY MOUTH TWICE DAILY WITH A MEAL   Cholecalciferol (VITAMIN D) 2000 units tablet Take 2,000 Units by mouth daily.    Cyanocobalamin (VITAMIN B-12) 5000 MCG TBDP Take 5,000 mcg by mouth daily.    Ferrous Sulfate (IRON) 325 (65 Fe) MG TABS Take 1 tablet by mouth 3 (three) times a week.   levothyroxine (SYNTHROID, LEVOTHROID) 75 MCG tablet Take 75 mcg by mouth daily before  breakfast.    oxyCODONE-acetaminophen (PERCOCET/ROXICET) 5-325 MG tablet Take 1 tablet by mouth every 6 (six) hours as needed for severe pain.   torsemide (DEMADEX) 20 MG tablet Take 20 mg by mouth daily.   No facility-administered encounter medications on file as of 10/02/2022.   I spent 60 minutes providing this consultation; time includes spent with patient/family, chart review and documentation. More than 50% of the time in this consultation was spent on care coordination.  Thank you for the opportunity to participate in the care of Ms. Ledonne.  The palliative care team will continue to follow. Please call our office at [phone] if we can be of additional assistance.   Note: Portions of this note were generated with Lobbyist. Dictation errors may occur despite best attempts at proofreading.  Teodoro Spray, NP

## 2022-11-18 ENCOUNTER — Other Ambulatory Visit: Payer: Medicare Other | Admitting: Hospice

## 2022-11-18 DIAGNOSIS — R112 Nausea with vomiting, unspecified: Secondary | ICD-10-CM | POA: Diagnosis not present

## 2022-11-18 DIAGNOSIS — M25552 Pain in left hip: Secondary | ICD-10-CM

## 2022-11-18 DIAGNOSIS — Z515 Encounter for palliative care: Secondary | ICD-10-CM

## 2022-11-18 DIAGNOSIS — R531 Weakness: Secondary | ICD-10-CM | POA: Diagnosis not present

## 2022-11-18 DIAGNOSIS — I509 Heart failure, unspecified: Secondary | ICD-10-CM | POA: Diagnosis not present

## 2022-11-18 NOTE — Progress Notes (Signed)
[facility] Consult Note Telephone: [phone]  Fax: [phone]  PATIENT NAME: Madison Marquez [address] [place] [address] [phone] (home)  DOB: 1924/04/10 MRN: [id number]  PRIMARY CARE PROVIDER:    Lajean Manes, MD,  [place]. [employer] [address] [employer] [address] [place] [address] [phone]  REFERRING PROVIDER:   Lajean Manes, MD [address] [place] [address] [place],  [place] [address] [phone]  RESPONSIBLE PARTY:   Self [phone] [name] is emergency contact. Contact Information     Name Relation Home Work [place] Son [phone]  [phone]   Dyann Kief Granddaughter   [place] Granddaughter   [phone]       Palliative Care was asked to follow this patient by consultation request of  Lajean Manes, MD to address advance care planning, goals of care clarification,  complex medical decision making.    Ronalee Belts and Terrence Dupont and [place] are with patient during visit.  Visit consisted of counseling and education dealing with the complex and emotionally intense issues of symptom management and palliative care in the setting of serious and potentially life-threatening illness.   Palliative care team will continue to support patient, patient's family, and medical team.   ASSESSMENT AND / RECOMMENDATIONS:   CODE STATUS: Patient is a Do Not Resuscitate.   Goals of Care: Goals include to maximize quality of life and symptom management.  Patient states she is ready to go back to the [facility] anytime he is ready for her. Discussion with patient and family on goals of care clarification.  Patient and family elects comfort measures only,    I spent 16 minutes providing this consultation. More than 50% of the time in this consultation was spent on counseling patient and coordinating  communication. -------------------------------------------------------------------------------------------------------------------------------------  Symptom Management/Plan: Comfort measures only. Weakness: Worsening weakness in the past 3 days. Ronalee Belts reports patient refusing to get out of bed, eating onlyabout 25% of her meals, refuses to go to the hospital, wishes to be left alone to pass on in peace.  Energy conservation, continue ongoing supportive care Nausea/vomiting: Acute.  Take Phenergan 25 mg tablet every 6 hours as needed for nausea and vomiting.  Granddaughter reports small light yellow vomit this morning, patient having more of nausea than vomiting.  Continue with ongoing oral hydration to prevent dehydration.  Patient denies constipation, having daily regular bowel movements. CHF: no exacerbation, no edema or shortness of breath. Continue Torsemide as ordered; elevate BLE during the day as much as possible to promote circulation.  Adhere to fluid and salt limits. Monitor closely and report weight gain of 3 pounds in a day or 5 pounds in a week.  Routine CBC CMP. Left hip pain: Managed with Tylenol . Use of heating pad encouraged  Follow up: Palliative care will continue to follow for complex medical decision making, advance care planning, and clarification of goals. Return 6 weeks or prn. Encouraged to call provider sooner with any concerns.   Family /Caregiver/Community Supports: Patient lives independently at [facility].  HOSPICE ELIGIBILITY/DIAGNOSIS: TBD  Chief Complaint: Follow up  HISTORY OF PRESENT ILLNESS:  Madison Marquez is a 89 y.o. year old female  with Multiple morbidities requiring close monitoring/management with high risk of complications and morbidity: systolic congestive heart failure, gait disturbance, left hip pain.  History of A-fib , migraine.  Patient reports nausea and vomiting which has been going on since yesterday, able to drink fluid and eat  some food, more  of nausea than vomiting.  She denies pain/discomfort, endorses weakness and desire to be left alone to rest. Review and summarization of Epic records shows history from other than patient.  Rest of 10 point ROS asked and negative.  I reviewed as needed, available labs, patient records, imaging, studies and related documents from the EMR.  Physical Exam: General: Frail CV: S1S2, no LE edema Pulmonary: LCTA, no increased work of breathing, no cough, 02 2L/Min Abdomen: normo-active BS + 4 quadrants, soft and non tender, reduced appetite, one time retch as to vomit but did not vomit.  MSK: sarcopenia, moves all extremities Skin: warm and dry, no rashes or wounds on visible skin Neuro:  generalized weakness,  no cognitive impairment Psych: non-anxious affect, A and O x 3 Hem/lymph/immuno: no widespread bruising  PAST MEDICAL HISTORY:  Active Ambulatory Problems    Diagnosis Date Noted   Hypercholesterolemia 08/25/2017   Hypothyroid 08/25/2017   Atrial fibrillation ([facility]) 04/13/2018   Resolved Ambulatory Problems    Diagnosis Date Noted   Acute congestive heart failure ([facility]) 08/25/2017   Chest pressure 08/25/2017   Past Medical History:  Diagnosis Date   Acute CHF (congestive heart failure) ([facility]) 08/25/2017   Arthritis    Epstein Barr infection 1950s   Migraine     SOCIAL HX:  Social History   Tobacco Use   Smoking status: Never   Smokeless tobacco: Never  Substance Use Topics   Alcohol use: No     FAMILY HX:  Family History  Problem Relation Age of Onset   Heart failure Brother    Diabetes Brother    Cancer Sister       ALLERGIES: No Known Allergies    PERTINENT MEDICATIONS:  Outpatient Encounter Medications as of 11/18/2022  Medication Sig   acetaminophen (TYLENOL) 500 MG tablet Take 1 tablet by mouth daily.   amiodarone (PACERONE) 200 MG tablet Take 0.5 tablets (100 mg total) by mouth daily.   Apoaequorin (PREVAGEN) 10 MG CAPS Take 1 tablet by mouth daily.   B  Complex-Biotin-FA (SUPER B-100) TABS Take by mouth.   carvedilol (COREG) 6.25 MG tablet TAKE 1 TABLET BY MOUTH TWICE DAILY WITH A MEAL   Cholecalciferol (VITAMIN D) 2000 units tablet Take 2,000 Units by mouth daily.    Cyanocobalamin (VITAMIN B-12) 5000 MCG TBDP Take 5,000 mcg by mouth daily.    Ferrous Sulfate (IRON) 325 (65 Fe) MG TABS Take 1 tablet by mouth 3 (three) times a week.   levothyroxine (SYNTHROID, LEVOTHROID) 75 MCG tablet Take 75 mcg by mouth daily before breakfast.    oxyCODONE-acetaminophen (PERCOCET/ROXICET) 5-325 MG tablet Take 1 tablet by mouth every 6 (six) hours as needed for severe pain.   torsemide (DEMADEX) 20 MG tablet Take 20 mg by mouth daily.   No facility-administered encounter medications on file as of 11/18/2022.    Thank you for the opportunity to participate in the care of Madison Marquez.  The palliative care team will continue to follow. Please call our office at [phone] if we can be of additional assistance.   Note: Portions of this note were generated with Lobbyist. Dictation errors may occur despite best attempts at proofreading.  Teodoro Spray, NP

## 2022-11-19 DIAGNOSIS — I469 Cardiac arrest, cause unspecified: Secondary | ICD-10-CM | POA: Diagnosis not present

## 2022-11-19 DIAGNOSIS — R404 Transient alteration of awareness: Secondary | ICD-10-CM | POA: Diagnosis not present

## 2022-11-19 DIAGNOSIS — R001 Bradycardia, unspecified: Secondary | ICD-10-CM | POA: Diagnosis not present

## 2023-01-10 ENCOUNTER — Other Ambulatory Visit: Payer: Self-pay | Admitting: Cardiology
